# Patient Record
Sex: Female | Born: 1955
Health system: Southern US, Community
[De-identification: ages and names within clinical notes are randomized; demographics above are authoritative.]

## PROBLEM LIST (undated history)

## (undated) DIAGNOSIS — Z7902 Long term (current) use of antithrombotics/antiplatelets: Secondary | ICD-10-CM

## (undated) DIAGNOSIS — I200 Unstable angina: Secondary | ICD-10-CM

## (undated) DIAGNOSIS — T4145XA Adverse effect of unspecified anesthetic, initial encounter: Secondary | ICD-10-CM

## (undated) DIAGNOSIS — I209 Angina pectoris, unspecified: Secondary | ICD-10-CM

## (undated) DIAGNOSIS — R062 Wheezing: Secondary | ICD-10-CM

## (undated) DIAGNOSIS — M1712 Unilateral primary osteoarthritis, left knee: Secondary | ICD-10-CM

## (undated) DIAGNOSIS — M1711 Unilateral primary osteoarthritis, right knee: Secondary | ICD-10-CM

## (undated) DIAGNOSIS — R002 Palpitations: Secondary | ICD-10-CM

## (undated) DIAGNOSIS — I10 Essential (primary) hypertension: Secondary | ICD-10-CM

## (undated) DIAGNOSIS — I8393 Asymptomatic varicose veins of bilateral lower extremities: Secondary | ICD-10-CM

## (undated) DIAGNOSIS — F32A Depression, unspecified: Secondary | ICD-10-CM

## (undated) DIAGNOSIS — R251 Tremor, unspecified: Secondary | ICD-10-CM

## (undated) DIAGNOSIS — E785 Hyperlipidemia, unspecified: Secondary | ICD-10-CM

## (undated) DIAGNOSIS — R053 Chronic cough: Secondary | ICD-10-CM

## (undated) DIAGNOSIS — J40 Bronchitis, not specified as acute or chronic: Secondary | ICD-10-CM

## (undated) DIAGNOSIS — R05 Cough: Secondary | ICD-10-CM

## (undated) DIAGNOSIS — I2510 Atherosclerotic heart disease of native coronary artery without angina pectoris: Secondary | ICD-10-CM

## (undated) DIAGNOSIS — R600 Localized edema: Secondary | ICD-10-CM

## (undated) DIAGNOSIS — S83231A Complex tear of medial meniscus, current injury, right knee, initial encounter: Secondary | ICD-10-CM

## (undated) DIAGNOSIS — J449 Chronic obstructive pulmonary disease, unspecified: Secondary | ICD-10-CM

## (undated) DIAGNOSIS — J189 Pneumonia, unspecified organism: Secondary | ICD-10-CM

## (undated) DIAGNOSIS — Z972 Presence of dental prosthetic device (complete) (partial): Secondary | ICD-10-CM

## (undated) DIAGNOSIS — E119 Type 2 diabetes mellitus without complications: Secondary | ICD-10-CM

## (undated) DIAGNOSIS — M23301 Other meniscus derangements, unspecified lateral meniscus, left knee: Secondary | ICD-10-CM

## (undated) DIAGNOSIS — N809 Endometriosis, unspecified: Secondary | ICD-10-CM

## (undated) DIAGNOSIS — R0609 Other forms of dyspnea: Secondary | ICD-10-CM

## (undated) DIAGNOSIS — F329 Major depressive disorder, single episode, unspecified: Secondary | ICD-10-CM

## (undated) DIAGNOSIS — S83232A Complex tear of medial meniscus, current injury, left knee, initial encounter: Secondary | ICD-10-CM

## (undated) DIAGNOSIS — M7122 Synovial cyst of popliteal space [Baker], left knee: Secondary | ICD-10-CM

## (undated) DIAGNOSIS — I499 Cardiac arrhythmia, unspecified: Secondary | ICD-10-CM

## (undated) DIAGNOSIS — Z789 Other specified health status: Secondary | ICD-10-CM

## (undated) DIAGNOSIS — F419 Anxiety disorder, unspecified: Secondary | ICD-10-CM

## (undated) DIAGNOSIS — Z9889 Other specified postprocedural states: Secondary | ICD-10-CM

## (undated) DIAGNOSIS — J3089 Other allergic rhinitis: Secondary | ICD-10-CM

## (undated) DIAGNOSIS — M7050 Other bursitis of knee, unspecified knee: Secondary | ICD-10-CM

## (undated) DIAGNOSIS — K5790 Diverticulosis of intestine, part unspecified, without perforation or abscess without bleeding: Secondary | ICD-10-CM

## (undated) DIAGNOSIS — J45909 Unspecified asthma, uncomplicated: Secondary | ICD-10-CM

## (undated) DIAGNOSIS — M23306 Other meniscus derangements, unspecified meniscus, right knee: Secondary | ICD-10-CM

## (undated) DIAGNOSIS — G2581 Restless legs syndrome: Secondary | ICD-10-CM

## (undated) DIAGNOSIS — G43909 Migraine, unspecified, not intractable, without status migrainosus: Secondary | ICD-10-CM

## (undated) DIAGNOSIS — R0600 Dyspnea, unspecified: Secondary | ICD-10-CM

## (undated) DIAGNOSIS — T8859XA Other complications of anesthesia, initial encounter: Secondary | ICD-10-CM

## (undated) DIAGNOSIS — M170 Bilateral primary osteoarthritis of knee: Secondary | ICD-10-CM

## (undated) DIAGNOSIS — Z7982 Long term (current) use of aspirin: Secondary | ICD-10-CM

## (undated) HISTORY — PX: ABDOMINAL HYSTERECTOMY: SHX81

## (undated) HISTORY — PX: BARIATRIC SURGERY: SHX1103

## (undated) HISTORY — DX: Angina pectoris, unspecified: I20.9

## (undated) HISTORY — PX: COLONOSCOPY: SHX174

## (undated) HISTORY — PX: EYE SURGERY: SHX253

## (undated) HISTORY — PX: CARDIAC CATHETERIZATION: SHX172

## (undated) HISTORY — PX: ECTOPIC PREGNANCY SURGERY: SHX613

## (undated) HISTORY — PX: COLON SURGERY: SHX602

## (undated) HISTORY — PX: CHOLECYSTECTOMY: SHX55

## (undated) HISTORY — PX: OOPHORECTOMY: SHX86

---

## 2004-01-08 ENCOUNTER — Other Ambulatory Visit: Payer: Self-pay

## 2004-01-08 ENCOUNTER — Emergency Department: Payer: Self-pay | Admitting: Emergency Medicine

## 2004-01-12 ENCOUNTER — Inpatient Hospital Stay: Payer: Self-pay | Admitting: Internal Medicine

## 2004-01-12 ENCOUNTER — Other Ambulatory Visit: Payer: Self-pay

## 2004-01-13 ENCOUNTER — Other Ambulatory Visit: Payer: Self-pay

## 2004-01-13 HISTORY — PX: CORONARY ANGIOPLASTY WITH STENT PLACEMENT: SHX49

## 2004-01-16 DIAGNOSIS — I2510 Atherosclerotic heart disease of native coronary artery without angina pectoris: Secondary | ICD-10-CM

## 2004-01-16 HISTORY — DX: Atherosclerotic heart disease of native coronary artery without angina pectoris: I25.10

## 2004-02-01 ENCOUNTER — Encounter: Payer: Self-pay | Admitting: Cardiology

## 2004-02-11 ENCOUNTER — Encounter: Payer: Self-pay | Admitting: Cardiology

## 2004-03-24 ENCOUNTER — Inpatient Hospital Stay: Payer: Self-pay | Admitting: Cardiology

## 2004-03-24 HISTORY — PX: CORONARY ANGIOPLASTY WITH STENT PLACEMENT: SHX49

## 2005-05-31 ENCOUNTER — Emergency Department: Payer: Self-pay | Admitting: Emergency Medicine

## 2005-05-31 ENCOUNTER — Other Ambulatory Visit: Payer: Self-pay

## 2005-07-26 ENCOUNTER — Ambulatory Visit: Payer: Self-pay | Admitting: Family Medicine

## 2005-08-09 ENCOUNTER — Other Ambulatory Visit: Payer: Self-pay

## 2005-08-09 ENCOUNTER — Emergency Department: Payer: Self-pay | Admitting: Emergency Medicine

## 2005-12-19 ENCOUNTER — Ambulatory Visit: Payer: Self-pay

## 2007-09-26 ENCOUNTER — Ambulatory Visit: Payer: Self-pay | Admitting: Family Medicine

## 2007-09-27 ENCOUNTER — Other Ambulatory Visit: Payer: Self-pay

## 2007-09-27 ENCOUNTER — Emergency Department: Payer: Self-pay | Admitting: Emergency Medicine

## 2008-05-11 DIAGNOSIS — E119 Type 2 diabetes mellitus without complications: Secondary | ICD-10-CM

## 2008-05-11 HISTORY — DX: Type 2 diabetes mellitus without complications: E11.9

## 2008-05-24 ENCOUNTER — Inpatient Hospital Stay: Payer: Self-pay | Admitting: Internal Medicine

## 2008-05-26 HISTORY — PX: LEFT HEART CATH AND CORONARY ANGIOGRAPHY: CATH118249

## 2008-05-29 ENCOUNTER — Inpatient Hospital Stay: Payer: Self-pay | Admitting: Specialist

## 2008-06-16 ENCOUNTER — Ambulatory Visit: Payer: Self-pay | Admitting: Specialist

## 2008-08-07 ENCOUNTER — Emergency Department: Payer: Self-pay | Admitting: Unknown Physician Specialty

## 2009-01-15 ENCOUNTER — Ambulatory Visit: Payer: Self-pay | Admitting: Gastroenterology

## 2009-03-02 ENCOUNTER — Ambulatory Visit: Payer: Self-pay | Admitting: Gastroenterology

## 2009-03-24 ENCOUNTER — Ambulatory Visit: Payer: Self-pay | Admitting: Gastroenterology

## 2010-02-21 ENCOUNTER — Inpatient Hospital Stay: Payer: Self-pay | Admitting: Internal Medicine

## 2010-06-10 ENCOUNTER — Emergency Department: Payer: Self-pay | Admitting: Emergency Medicine

## 2010-08-29 ENCOUNTER — Inpatient Hospital Stay: Payer: Self-pay | Admitting: Internal Medicine

## 2011-01-18 ENCOUNTER — Ambulatory Visit: Payer: Self-pay | Admitting: Cardiology

## 2011-01-18 HISTORY — PX: LEFT HEART CATH AND CORONARY ANGIOGRAPHY: CATH118249

## 2012-03-13 DIAGNOSIS — Z9884 Bariatric surgery status: Secondary | ICD-10-CM

## 2012-03-13 HISTORY — DX: Bariatric surgery status: Z98.84

## 2013-02-12 ENCOUNTER — Emergency Department: Payer: Self-pay | Admitting: Emergency Medicine

## 2014-01-23 ENCOUNTER — Emergency Department: Payer: Self-pay | Admitting: Emergency Medicine

## 2014-02-21 ENCOUNTER — Emergency Department: Payer: Self-pay | Admitting: Emergency Medicine

## 2014-02-21 LAB — BASIC METABOLIC PANEL
Anion Gap: 5 — ABNORMAL LOW (ref 7–16)
BUN: 15 mg/dL (ref 7–18)
CO2: 29 mmol/L (ref 21–32)
CREATININE: 0.7 mg/dL (ref 0.60–1.30)
Calcium, Total: 8.5 mg/dL (ref 8.5–10.1)
Chloride: 108 mmol/L — ABNORMAL HIGH (ref 98–107)
EGFR (African American): >60
EGFR (Non-African Amer.): >60
Glucose: 110 mg/dL — ABNORMAL HIGH (ref 65–99)
Osmolality: 285 (ref 275–301)
Potassium: 4.4 mmol/L (ref 3.5–5.1)
SODIUM: 142 mmol/L (ref 136–145)

## 2014-02-21 LAB — CBC
HCT: 42.8 % (ref 35.0–47.0)
HGB: 14.3 g/dL (ref 12.0–16.0)
MCH: 30.9 pg (ref 26.0–34.0)
MCHC: 33.5 g/dL (ref 32.0–36.0)
MCV: 92 fL (ref 80–100)
PLATELETS: 236 10*3/uL (ref 150–440)
RBC: 4.65 10*6/uL (ref 3.80–5.20)
RDW: 12.5 % (ref 11.5–14.5)
WBC: 7.7 10*3/uL (ref 3.6–11.0)

## 2014-02-21 LAB — TROPONIN I: Troponin-I: <0.02 ng/mL

## 2016-08-31 ENCOUNTER — Emergency Department: Payer: Self-pay

## 2016-08-31 ENCOUNTER — Observation Stay
Admission: EM | Admit: 2016-08-31 | Discharge: 2016-09-01 | Disposition: A | Discharge status: Home / Self Care | Payer: Self-pay | Attending: Internal Medicine | Admitting: Internal Medicine

## 2016-08-31 ENCOUNTER — Encounter: Payer: Self-pay | Admitting: Emergency Medicine

## 2016-08-31 DIAGNOSIS — Z955 Presence of coronary angioplasty implant and graft: Secondary | ICD-10-CM | POA: Insufficient documentation

## 2016-08-31 DIAGNOSIS — J449 Chronic obstructive pulmonary disease, unspecified: Secondary | ICD-10-CM | POA: Insufficient documentation

## 2016-08-31 DIAGNOSIS — Z79899 Other long term (current) drug therapy: Secondary | ICD-10-CM | POA: Insufficient documentation

## 2016-08-31 DIAGNOSIS — F419 Anxiety disorder, unspecified: Secondary | ICD-10-CM | POA: Insufficient documentation

## 2016-08-31 DIAGNOSIS — I2510 Atherosclerotic heart disease of native coronary artery without angina pectoris: Secondary | ICD-10-CM | POA: Insufficient documentation

## 2016-08-31 DIAGNOSIS — Z7982 Long term (current) use of aspirin: Secondary | ICD-10-CM | POA: Insufficient documentation

## 2016-08-31 DIAGNOSIS — R079 Chest pain, unspecified: Principal | ICD-10-CM | POA: Insufficient documentation

## 2016-08-31 DIAGNOSIS — E1140 Type 2 diabetes mellitus with diabetic neuropathy, unspecified: Secondary | ICD-10-CM | POA: Insufficient documentation

## 2016-08-31 DIAGNOSIS — R001 Bradycardia, unspecified: Secondary | ICD-10-CM | POA: Insufficient documentation

## 2016-08-31 DIAGNOSIS — I10 Essential (primary) hypertension: Secondary | ICD-10-CM | POA: Insufficient documentation

## 2016-08-31 HISTORY — DX: Atherosclerotic heart disease of native coronary artery without angina pectoris: I25.10

## 2016-08-31 HISTORY — DX: Essential (primary) hypertension: I10

## 2016-08-31 HISTORY — DX: Chronic obstructive pulmonary disease, unspecified: J44.9

## 2016-08-31 HISTORY — DX: Type 2 diabetes mellitus without complications: E11.9

## 2016-08-31 LAB — CBC
HEMATOCRIT: 39.1 % (ref 35.0–47.0)
Hemoglobin: 13.4 g/dL (ref 12.0–16.0)
MCH: 30.7 pg (ref 26.0–34.0)
MCHC: 34.4 g/dL (ref 32.0–36.0)
MCV: 89.4 fL (ref 80.0–100.0)
PLATELETS: 244 10*3/uL (ref 150–440)
RBC: 4.37 MIL/uL (ref 3.80–5.20)
RDW: 13.1 % (ref 11.5–14.5)
WBC: 5.5 10*3/uL (ref 3.6–11.0)

## 2016-08-31 LAB — BASIC METABOLIC PANEL
Anion gap: 3 — ABNORMAL LOW (ref 5–15)
BUN: 16 mg/dL (ref 6–20)
CALCIUM: 8.8 mg/dL — AB (ref 8.9–10.3)
CO2: 26 mmol/L (ref 22–32)
CREATININE: 0.73 mg/dL (ref 0.44–1.00)
Chloride: 107 mmol/L (ref 101–111)
GFR calc Af Amer: >60 mL/min (ref >60)
GLUCOSE: 121 mg/dL — AB (ref 65–99)
POTASSIUM: 4.2 mmol/L (ref 3.5–5.1)
Sodium: 136 mmol/L (ref 135–145)

## 2016-08-31 LAB — TROPONIN I
Troponin I: <0.03 ng/mL (ref <0.03)
Troponin I: <0.03 ng/mL (ref <0.03)

## 2016-08-31 MED ORDER — ACETAMINOPHEN 650 MG RE SUPP: 650.0000 mg | Freq: Four times a day (QID) | RECTAL | Status: DC | PRN

## 2016-08-31 MED ORDER — ONDANSETRON HCL 4 MG/2ML IJ SOLN: 4.0000 mg | Freq: Four times a day (QID) | INTRAMUSCULAR | Status: DC | PRN

## 2016-08-31 MED ORDER — METOPROLOL SUCCINATE ER 25 MG PO TB24
25.0000 mg | ORAL_TABLET | Freq: Every day | ORAL | Status: DC
  Administered 2016-09-01: 25 mg via ORAL
  Filled 2016-08-31: qty 1

## 2016-08-31 MED ORDER — ALPRAZOLAM 0.5 MG PO TABS
0.5000 mg | ORAL_TABLET | Freq: Two times a day (BID) | ORAL | Status: DC | PRN
  Administered 2016-08-31 – 2016-09-01 (×2): 0.5 mg via ORAL
  Filled 2016-08-31 (×2): qty 1

## 2016-08-31 MED ORDER — NITROGLYCERIN 0.4 MG SL SUBL: 0.4000 mg | SUBLINGUAL_TABLET | SUBLINGUAL | Status: DC | PRN

## 2016-08-31 MED ORDER — ENOXAPARIN SODIUM 40 MG/0.4ML ~~LOC~~ SOLN
40.0000 mg | SUBCUTANEOUS | Status: DC
  Administered 2016-08-31: 40 mg via SUBCUTANEOUS
  Filled 2016-08-31: qty 0.4

## 2016-08-31 MED ORDER — ASPIRIN 81 MG PO CHEW
324.0000 mg | CHEWABLE_TABLET | Freq: Once | ORAL | Status: AC
  Administered 2016-08-31: 324 mg via ORAL
  Filled 2016-08-31: qty 4

## 2016-08-31 MED ORDER — PAROXETINE HCL 20 MG PO TABS
20.0000 mg | ORAL_TABLET | Freq: Every day | ORAL | Status: DC
  Administered 2016-09-01: 20 mg via ORAL
  Filled 2016-08-31: qty 1

## 2016-08-31 MED ORDER — GABAPENTIN 300 MG PO CAPS
300.0000 mg | ORAL_CAPSULE | Freq: Two times a day (BID) | ORAL | Status: DC
  Administered 2016-08-31 – 2016-09-01 (×2): 300 mg via ORAL
  Filled 2016-08-31 (×2): qty 1

## 2016-08-31 MED ORDER — ONDANSETRON HCL 4 MG PO TABS: 4.0000 mg | ORAL_TABLET | Freq: Four times a day (QID) | ORAL | Status: DC | PRN

## 2016-08-31 MED ORDER — ASPIRIN EC 81 MG PO TBEC
81.0000 mg | DELAYED_RELEASE_TABLET | Freq: Every day | ORAL | Status: DC
  Administered 2016-09-01: 81 mg via ORAL
  Filled 2016-08-31: qty 1

## 2016-08-31 MED ORDER — ACETAMINOPHEN 325 MG PO TABS: 650.0000 mg | ORAL_TABLET | Freq: Four times a day (QID) | ORAL | Status: DC | PRN

## 2016-08-31 NOTE — ED Triage Notes (Signed)
Pt in via POV with complaints of centralized chest pressure x 2 weeks w/ associated shortness of breaht, weakness, N/V.  Pt advised per PCP to be seen in ER.  Pt see Dr. Cassie FreerParachos but has been unable to follow up with him regularly due to lack of insurance.  Pt with hx of cardiac cath w/ multiple stents placed in 2004 and 2005.  Vitals WDL, NAD noted at this time.

## 2016-08-31 NOTE — H&P (Signed)
[facility] [place] [facility]   PATIENT NAME: Courtney Blanchard    MR#:  [id number]  DATE OF BIRTH:  1956-03-04  DATE OF ADMISSION:  08/31/2016  PRIMARY CARE PHYSICIAN: Jerl Mina, MD   REQUESTING/REFERRING PHYSICIAN: Dr. Gladstone Pih  CHIEF COMPLAINT:   Chief Complaint  Patient presents with  . Chest Pain    HISTORY OF PRESENT ILLNESS:  Courtney Blanchard  is a 61 y.o. female with a known history of COPD, history of coronary disease status post stents, diabetes, hypertension who presents to the hospital due to chest pain/pressure. Patient says she developed chest pain/pressure over the past week although over the past 2 days it has been persistent. She has associated nausea and shortness of breath with it. Patient says that she has not had these symptoms before. She does have a previous history of coronary artery disease and stent placements but has not seen a cardiologist in many years due to insurance reasons. She now presents to the hospital as her symptoms are not improving and hospitalist services were contacted further treatment and evaluation. Patient's EKG shows sinus bradycardia with LVH but no evidence of acute ST-T wave changes. Her cardiac markers also negative. Given her family history and her risk factors hospitalist services were contacted further treatment and evaluation.  PAST MEDICAL HISTORY:   Past Medical History:  Diagnosis Date  . COPD (chronic obstructive pulmonary disease) (HCC)   . Coronary artery disease   . Diabetes mellitus without complication (HCC)   . Hypertension     PAST SURGICAL HISTORY:  History reviewed. No pertinent surgical history.  SOCIAL HISTORY:   Social History  Substance Use Topics  . Smoking status: Never Smoker  . Smokeless tobacco: Never Used  . Alcohol use No    FAMILY HISTORY:   Family History  Problem Relation Age of Onset  . Hypertension Mother   . Heart disease Father   . CAD Father   .  Heart disease Sister     DRUG ALLERGIES:   Allergies  Allergen Reactions  . Amoxicillin     Has patient had a PCN reaction causing immediate rash, facial/tongue/throat swelling, SOB or lightheadedness with hypotension: Yes Has patient had a PCN reaction causing severe rash involving mucus membranes or skin necrosis: No Has patient had a PCN reaction that required hospitalization: No Has patient had a PCN reaction occurring within the last 10 years: No If all of the above answers are "NO", then may proceed with Cephalosporin use.  Sore throat     REVIEW OF SYSTEMS:   Review of Systems  Constitutional: Negative for chills, fever and weight loss.  HENT: Negative for congestion, nosebleeds and tinnitus.   Eyes: Negative for blurred vision, double vision and redness.  Respiratory: Positive for shortness of breath. Negative for cough, hemoptysis and wheezing.   Cardiovascular: Positive for chest pain. Negative for orthopnea, leg swelling and PND.  Gastrointestinal: Negative for abdominal pain, diarrhea, melena, nausea and vomiting.  Genitourinary: Negative for dysuria, hematuria and urgency.  Musculoskeletal: Negative for falls and joint pain.  Neurological: Negative for dizziness, tingling, sensory change, focal weakness, seizures, weakness and headaches.  Endo/Heme/Allergies: Negative for polydipsia. Does not bruise/bleed easily.  Psychiatric/Behavioral: Negative for depression and memory loss. The patient is not nervous/anxious.   All other systems reviewed and are negative.   MEDICATIONS AT HOME:   Prior to Admission medications   Medication Sig Start Date End Date Taking? Authorizing Provider  acetaminophen (TYLENOL) 500 MG tablet  Take 500 mg by mouth every 6 (six) hours as needed.   Yes [provider]  ALPRAZolam Prudy Feeler) 0.5 MG tablet Take 0.5 mg by mouth 2 (two) times daily as needed. 07/04/16  Yes [provider]  aspirin 81 MG tablet Take 81 mg by mouth  daily.    Yes [provider]  gabapentin (NEURONTIN) 300 MG capsule Take 300 mg by mouth 2 (two) times daily.   Yes [provider]  metoprolol succinate (TOPROL-XL) 25 MG 24 hr tablet Take 25 mg by mouth daily. 06/26/16  Yes [provider]  PARoxetine (PAXIL) 20 MG tablet Take 20 mg by mouth daily. 02/28/16  Yes [provider]      VITAL SIGNS:  Blood pressure 121/83, pulse 69, temperature 98.2 F (36.8 C), temperature source Oral, resp. rate 12, height 5\' 2"  (1.575 m), weight 93 kg (205 lb), SpO2 92 %.  PHYSICAL EXAMINATION:  Physical Exam  GENERAL:  61 y.o.-year-old patient lying in the bed in no acute distress.  EYES: Pupils equal, round, reactive to light and accommodation. No scleral icterus. Extraocular muscles intact.  HEENT: Head atraumatic, normocephalic. Oropharynx and nasopharynx clear. No oropharyngeal erythema, moist oral mucosa  NECK:  Supple, no jugular venous distention. No thyroid enlargement, no tenderness.  LUNGS: Normal breath sounds bilaterally, no wheezing, rales, rhonchi. No use of accessory muscles of respiration.  CARDIOVASCULAR: S1, S2 RRR. No murmurs, rubs, gallops, clicks.  ABDOMEN: Soft, nontender, nondistended. Bowel sounds present. No organomegaly or mass.  EXTREMITIES: No pedal edema, cyanosis, or clubbing. + 2 pedal & radial pulses b/l.   NEUROLOGIC: Cranial nerves II through XII are intact. No focal Motor or sensory deficits appreciated b/l. PSYCHIATRIC: The patient is alert and oriented x 3. Good affect.  SKIN: No obvious rash, lesion, or ulcer.   LABORATORY PANEL:   CBC  Recent Labs Lab 08/31/16 1439  WBC 5.5  HGB 13.4  HCT 39.1  PLT 244   ------------------------------------------------------------------------------------------------------------------  Chemistries   Recent Labs Lab 08/31/16 1439  NA 136  K 4.2  CL 107  CO2 26  GLUCOSE 121*  BUN 16  CREATININE 0.73  CALCIUM 8.8*    ------------------------------------------------------------------------------------------------------------------  Cardiac Enzymes  Recent Labs Lab 08/31/16 1439  TROPONINI <0.03   ------------------------------------------------------------------------------------------------------------------  RADIOLOGY:  Dg Chest 2 View  Result Date: 08/31/2016 CLINICAL DATA:  Short of breath for 1 month, worse recently, chest pressure EXAM: CHEST  2 VIEW COMPARISON:  Chest x-ray of 08/29/2010 FINDINGS: No active infiltrate or effusion is seen. Mediastinal and hilar contours are unremarkable. The heart is within normal limits in size. Coronary artery stent is noted anteriorly. No bony abnormality is seen. IMPRESSION: No active cardiopulmonary disease. Electronically Signed   By: Dwyane Dee M.D.   On: 08/31/2016 14:58   US Venous Img Lower Bilateral  Result Date: 08/31/2016 CLINICAL DATA:  BILATERAL lower extremity pain, history of DVT, diabetes mellitus, hypertension, COPD, coronary artery disease EXAM: BILATERAL LOWER EXTREMITY VENOUS DOPPLER ULTRASOUND TECHNIQUE: Gray-scale sonography with graded compression, as well as color Doppler and duplex ultrasound were performed to evaluate the lower extremity deep venous systems from the level of the common femoral vein and including the common femoral, femoral, profunda femoral, popliteal and calf veins including the posterior tibial, peroneal and gastrocnemius veins when visible. The superficial great saphenous vein was also interrogated. Spectral Doppler was utilized to evaluate flow at rest and with distal augmentation maneuvers in the common femoral, femoral and popliteal veins. COMPARISON:  02/25/2010  FINDINGS: RIGHT LOWER EXTREMITY Common Femoral Vein: No evidence of thrombus. Normal compressibility, respiratory phasicity and response to augmentation. Saphenofemoral Junction: No evidence of thrombus. Normal compressibility and flow on color Doppler  imaging. Profunda Femoral Vein: No evidence of thrombus. Normal compressibility and flow on color Doppler imaging. Femoral Vein: No evidence of thrombus. Normal compressibility, respiratory phasicity and response to augmentation. Popliteal Vein: No evidence of thrombus. Normal compressibility, respiratory phasicity and response to augmentation. Calf Veins: No evidence of thrombus. Normal compressibility and flow on color Doppler imaging. Superficial Great Saphenous Vein: No evidence of thrombus. Normal compressibility and flow on color Doppler imaging. Venous Reflux:  None. Other Findings: Mildly complicated Baker cyst RIGHT popliteal fossa 5.1 x 1.2 x 2.1 cm containing scattered internal echoes/debris. LEFT LOWER EXTREMITY Common Femoral Vein: No evidence of thrombus. Normal compressibility, respiratory phasicity and response to augmentation. Saphenofemoral Junction: No evidence of thrombus. Normal compressibility and flow on color Doppler imaging. Profunda Femoral Vein: No evidence of thrombus. Normal compressibility and flow on color Doppler imaging. Femoral Vein: No evidence of thrombus. Normal compressibility, respiratory phasicity and response to augmentation. Popliteal Vein: No evidence of thrombus. Normal compressibility, respiratory phasicity and response to augmentation. Calf Veins: No evidence of thrombus. Normal compressibility and flow on color Doppler imaging. Superficial Great Saphenous Vein: No evidence of thrombus. Normal compressibility and flow on color Doppler imaging. Venous Reflux:  None. Other Findings:  Small LEFT popliteal fossa cyst 12 x 8 x 11 mm. IMPRESSION: No evidence of DVT within either lower extremity. Probable BILATERAL Baker cysts, smaller on LEFT. Electronically Signed   By: Ulyses SouthwardMark  Boles M.D.   On: 08/31/2016 18:53     IMPRESSION AND PLAN:   61 year old female with past medical history of COPD, hypertension, history of coronary artery disease status post stent, diabetes who  presents to the hospital with chest pain.  1. Chest pain-patient's symptoms are very atypical although she has significant risk factors given her diabetes, hypertension and previous history of coronary artery disease. -Cervical overnight on telemetry, cycle her cardiac markers. We'll get a nuclear medicine stress test in the morning.  - will place on aspirin, nitroglycerin. -Suspect this is mostly anxiety mediated.  2. Essential hypertension-continue Toprol.  3. Anxiety-continue Xanax, Paxil.  4. History of neuropathy-continue gabapentin    All the records are reviewed and case discussed with ED provider. Management plans discussed with the patient, family and they are in agreement.  CODE STATUS: full code  TOTAL TIME TAKING CARE OF THIS PATIENT: 40 minutes.    Houston SirenSAINANI,VIVEK J M.D on 08/31/2016 at 6:58 PM  Between 7am to 6pm - Pager - [phone]  After 6pm go to [email or web address] - password [email or web address] [facility]  [facility]  Office  [phone]  CC: Primary care physician; Jerl MinaHedrick, James, MD

## 2016-08-31 NOTE — ED Provider Notes (Signed)
[facility] Emergency Department Provider Note  ____________________________________________   First MD Initiated Contact with Patient 08/31/16 1715     (approximate)  I have reviewed the triage vital signs and the nursing notes.   HISTORY  Chief Complaint Chest Pain   HPI Courtney Blanchard is a 61 y.o. female with a history of coronary disease status post stenting in 2004 2005 was presenting to the emergency department today with on and off chest pain, weakness, nausea vomiting over the past several weeks.  The patient says that the chest pain feels like someone is sitting on her chest and radiates up to her neck, bilaterally. She says that it comes and goes at random and last about a minute at a time. She says that she was having more frequent episodes today and so presented to the emergency department for further evaluation. She says that these symptoms are similar to when she presented greater than 10 years ago and required stenting. She says that she also has bilateral lower extremity aching especially to her posterior left calf and has a history of blood clots. The patient is only now on a baby aspirin per day.    Past Medical History:  Diagnosis Date  . COPD (chronic obstructive pulmonary disease) (HCC)   . Coronary artery disease   . Diabetes mellitus without complication (HCC)   . Hypertension     There are no active problems to display for this patient.   History reviewed. No pertinent surgical history.  Prior to Admission medications   Not on File    Allergies Amoxicillin  No family history on file.  Social History Social History  Substance Use Topics  . Smoking status: Never Smoker  . Smokeless tobacco: Never Used  . Alcohol use No    Review of Systems  Constitutional: No fever/chills Eyes: No visual changes. ENT: No sore throat. Cardiovascular: as above Respiratory: as above Gastrointestinal: No abdominal pain.  No  diarrhea.  No constipation. Genitourinary: Negative for dysuria. Musculoskeletal: Negative for back pain. Skin: Negative for rash. Neurological: Negative for headaches, focal weakness or numbness.   ____________________________________________   PHYSICAL EXAM:  VITAL SIGNS: ED Triage Vitals  Enc Vitals Group     BP 08/31/16 1436 123/64     Pulse Rate 08/31/16 1436 (!) 53     Resp 08/31/16 1436 16     Temp 08/31/16 1436 98.2 F (36.8 C)     Temp Source 08/31/16 1436 Oral     SpO2 08/31/16 1436 97 %     Weight 08/31/16 1437 205 lb (93 kg)     Height 08/31/16 1437 5\' 2"  (1.575 m)     Head Circumference --      Peak Flow --      Pain Score 08/31/16 1436 8     Pain Loc --      Pain Edu? --      Excl. in GC? --     Constitutional: Alert and oriented. Well appearing and in no acute distress. Eyes: Conjunctivae are normal.  Head: Atraumatic. Nose: No congestion/rhinnorhea. Mouth/Throat: Mucous membranes are moist.  Neck: No stridor.   Cardiovascular: Normal rate, regular rhythm. Grossly normal heart sounds.   Respiratory: Normal respiratory effort.  No retractions. Lungs CTAB. Gastrointestinal: Soft and nontender. No distention.  Musculoskeletal: No lower extremity tenderness nor edema.  No joint effusions. Neurologic:  Normal speech and language. No gross focal neurologic deficits are appreciated. Skin:  Skin is warm, dry and  intact. No rash noted. Psychiatric: Mood and affect are normal. Speech and behavior are normal.  ____________________________________________   LABS (all labs ordered are listed, but only abnormal results are displayed)  Labs Reviewed  BASIC METABOLIC PANEL - Abnormal; Notable for the following:       Result Value   Glucose, Bld 121 (*)    Calcium 8.8 (*)    Anion gap 3 (*)    All other components within normal limits  CBC  TROPONIN I   ____________________________________________  EKG  ED ECG REPORT I, Arelia Longest, the  attending physician, personally viewed and interpreted this ECG.   Date: 08/31/2016  EKG Time: 1430  Rate: 52  Rhythm: Sinus bradycardia  Axis: Normal  Intervals:none  ST&T Change: No ST segment elevation or depressions. No abnormal T-wave inversion.  ____________________________________________  RADIOLOGY  No active cardiopulmonary disease ____________________________________________   PROCEDURES  Procedure(s) performed:   Procedures  Critical Care performed:   ____________________________________________   INITIAL IMPRESSION / ASSESSMENT AND PLAN / ED COURSE  Pertinent labs & imaging results that were available during my care of the patient were reviewed by me and considered in my medical decision making (see chart for details).  Patient says that she is very concerned about her symptoms especially because there almost identical to when she required stents in the past. Very reassuring initial workup. However, because of her clinical presentation she'll be admitted to the hospital for further cardiac monitoring and cardiology consult. Signed out to Dr. Cherlynn Kaiser. Patient is understanding of this plan and willing to comply. Symptoms consistent with unstable angina. Patient says that the symptoms occur even at rest.      ____________________________________________   FINAL CLINICAL IMPRESSION(S) / ED DIAGNOSES  Chest pain.    NEW MEDICATIONS STARTED DURING THIS VISIT:  New Prescriptions   No medications on file     Note:  This document was prepared using Dragon voice recognition software and may include unintentional dictation errors.     Myrna Blazer, MD 08/31/16 [phone]

## 2016-08-31 NOTE — ED Notes (Signed)
Patient reports that for the last month, she has had increasing SOB and fatigue with exertion. States she is normally very active but has had more trouble moving around. Patient denies swelling in feet or hands. Reports she has pain in her neck and chest that has been intermittent for 2-3 weeks. Patient reports history of stent placement in 2004.

## 2016-08-31 NOTE — ED Notes (Signed)
Patient transported to X-ray 

## 2016-08-31 NOTE — ED Notes (Signed)
Patient transported to US 

## 2016-09-01 ENCOUNTER — Ambulatory Visit (HOSPITAL_BASED_OUTPATIENT_CLINIC_OR_DEPARTMENT_OTHER): Payer: Self-pay

## 2016-09-01 DIAGNOSIS — R079 Chest pain, unspecified: Secondary | ICD-10-CM

## 2016-09-01 LAB — CBC
HCT: 36.1 % (ref 35.0–47.0)
HEMOGLOBIN: 12.5 g/dL (ref 12.0–16.0)
MCH: 31.2 pg (ref 26.0–34.0)
MCHC: 34.6 g/dL (ref 32.0–36.0)
MCV: 90 fL (ref 80.0–100.0)
PLATELETS: 192 10*3/uL (ref 150–440)
RBC: 4.02 MIL/uL (ref 3.80–5.20)
RDW: 12.8 % (ref 11.5–14.5)
WBC: 5.3 10*3/uL (ref 3.6–11.0)

## 2016-09-01 LAB — BASIC METABOLIC PANEL
ANION GAP: 5 (ref 5–15)
BUN: 14 mg/dL (ref 6–20)
CALCIUM: 8.5 mg/dL — AB (ref 8.9–10.3)
CO2: 26 mmol/L (ref 22–32)
Chloride: 109 mmol/L (ref 101–111)
Creatinine, Ser: 0.52 mg/dL (ref 0.44–1.00)
GFR calc non Af Amer: >60 mL/min (ref >60)
Glucose, Bld: 75 mg/dL (ref 65–99)
Potassium: 3.9 mmol/L (ref 3.5–5.1)
SODIUM: 140 mmol/L (ref 135–145)

## 2016-09-01 LAB — NM MYOCAR MULTI W/SPECT W/WALL MOTION / EF
CHL CUP NUCLEAR SRS: 2
CHL CUP NUCLEAR SSS: 2
CSEPED: 0 min
CSEPEDS: 0 s
CSEPHR: 46 %
CSEPPHR: 75 {beats}/min
Estimated workload: 1 METS
LV sys vol: 27 mL
LVDIAVOL: 80 mL (ref 46–106)
MPHR: 162 {beats}/min
Rest HR: 45 {beats}/min
SDS: 0
TID: 0.9

## 2016-09-01 LAB — TROPONIN I: Troponin I: <0.03 ng/mL (ref <0.03)

## 2016-09-01 MED ORDER — TECHNETIUM TC 99M TETROFOSMIN IV KIT
29.5500 | PACK | Freq: Once | INTRAVENOUS | Status: AC | PRN
  Administered 2016-09-01: 29.55 via INTRAVENOUS

## 2016-09-01 MED ORDER — TECHNETIUM TC 99M TETROFOSMIN IV KIT
13.1700 | PACK | Freq: Once | INTRAVENOUS | Status: AC | PRN
  Administered 2016-09-01: 13.17 via INTRAVENOUS

## 2016-09-01 MED ORDER — REGADENOSON 0.4 MG/5ML IV SOLN
0.4000 mg | Freq: Once | INTRAVENOUS | Status: AC
  Administered 2016-09-01: 0.4 mg via INTRAVENOUS

## 2016-09-01 NOTE — Care Management (Signed)
RNCM received call from Dr. Allena KatzPatel stating that patient is telling her that she will need written prescriptions at discharge. I explained to both Dr. Allena KatzPatel and patient that this is only for new medications however per my conversation with Dr. Allena KatzPatel she does not plan to discharge her on any new medications. Patient advised that when she receives prescriptions for refills from her PCP or [facility] (when established) she should take them to Medication management Monday thru Friday before 430P to see if they can assist her. She is aware that they will not be able to assist her with any psychotropic or narcotic medications. Patient did not have any questions.

## 2016-09-01 NOTE — Care Management (Signed)
No other RNCM needs identified.

## 2016-09-01 NOTE — Progress Notes (Signed)
Admitted from home with chest pain. Denies pain now. A&O. Up with minimal assist. Stress test for today. Consent signed. Slept well through the night.

## 2016-09-01 NOTE — Discharge Summary (Signed)
[facility] - [place] at [facility]   PATIENT NAME: Courtney Blanchard    MR#:  [id number]  DATE OF BIRTH:  06-06-1955  DATE OF ADMISSION:  08/31/2016 ADMITTING PHYSICIAN: Houston Siren, MD  DATE OF DISCHARGE: 09/01/16  PRIMARY CARE PHYSICIAN: Jerl Mina, MD    ADMISSION DIAGNOSIS:  Chest pain, unspecified type [R07.9]  DISCHARGE DIAGNOSIS:  Chest pain ruled out MI Transient bradycardia--now off BB  SECONDARY DIAGNOSIS:   Past Medical History:  Diagnosis Date  . COPD (chronic obstructive pulmonary disease) (HCC)   . Coronary artery disease   . Diabetes mellitus without complication (HCC)   . Hypertension     HOSPITAL COURSE:  61 year old female with past medical history of COPD, hypertension, history of coronary artery disease status post stent, diabetes who presents to the hospital with chest pain.  1. Chest pain-patient's symptoms are very atypical although she has significant risk factors given her diabetes, hypertension and previous history of coronary artery disease. -Myoview stress test per cardiology negative. Mild low EF--final results pending - will place on aspirin, nitroglycerin. -Suspect this is mostly anxiety mediated.  2. Essential hypertension-BP low normal -hold off BB due to HR 44-55. Pt will cont to monitor HR at home  3. Anxiety-continue Xanax, Paxil.  4. History of neuropathy-continue gabapentin  D/c home with out pt f/u with Dr Burnett Sheng  CONSULTS OBTAINED:    DRUG ALLERGIES:   Allergies  Allergen Reactions  . Amoxicillin     Has patient had a PCN reaction causing immediate rash, facial/tongue/throat swelling, SOB or lightheadedness with hypotension: Yes Has patient had a PCN reaction causing severe rash involving mucus membranes or skin necrosis: No Has patient had a PCN reaction that required hospitalization: No Has patient had a PCN reaction occurring within the last 10 years: No If all of the above  answers are "NO", then may proceed with Cephalosporin use.  Sore throat     DISCHARGE MEDICATIONS:   Current Discharge Medication List    CONTINUE these medications which have NOT CHANGED   Details  acetaminophen (TYLENOL) 500 MG tablet Take 500 mg by mouth every 6 (six) hours as needed.    ALPRAZolam (XANAX) 0.5 MG tablet Take 0.5 mg by mouth 2 (two) times daily as needed.    aspirin 81 MG tablet Take 81 mg by mouth daily.     gabapentin (NEURONTIN) 300 MG capsule Take 300 mg by mouth 2 (two) times daily.    PARoxetine (PAXIL) 20 MG tablet Take 20 mg by mouth daily.      STOP taking these medications     metoprolol succinate (TOPROL-XL) 25 MG 24 hr tablet         If you experience worsening of your admission symptoms, develop shortness of breath, life threatening emergency, suicidal or homicidal thoughts you must seek medical attention immediately by calling 911 or calling your MD immediately  if symptoms less severe.  You Must read complete instructions/literature along with all the possible adverse reactions/side effects for all the Medicines you take and that have been prescribed to you. Take any new Medicines after you have completely understood and accept all the possible adverse reactions/side effects.   Please note  You were cared for by a hospitalist during your hospital stay. If you have any questions about your discharge medications or the care you received while you were in the hospital after you are discharged, you can call the unit and asked to speak with the hospitalist on  call if the hospitalist that took care of you is not available. Once you are discharged, your primary care physician will handle any further medical issues. Please note that NO REFILLS for any discharge medications will be authorized once you are discharged, as it is imperative that you return to your primary care physician (or establish a relationship with a primary care physician if you do not  have one) for your aftercare needs so that they can reassess your need for medications and monitor your lab values. Today   SUBJECTIVE   Generalized weakness  VITAL SIGNS:  Blood pressure 106/60, pulse (!) 52, temperature 97.9 F (36.6 C), temperature source Oral, resp. rate 16, height 5\' 2"  (1.575 m), weight 88.3 kg (194 lb 11.2 oz), SpO2 98 %.  I/O:   Intake/Output Summary (Last 24 hours) at 09/01/16 1618 Last data filed at 09/01/16 0858  Gross per 24 hour  Intake                0 ml  Output              550 ml  Net             -550 ml    PHYSICAL EXAMINATION:  GENERAL:  61 y.o.-year-old patient lying in the bed with no acute distress.  EYES: Pupils equal, round, reactive to light and accommodation. No scleral icterus. Extraocular muscles intact.  HEENT: Head atraumatic, normocephalic. Oropharynx and nasopharynx clear.  NECK:  Supple, no jugular venous distention. No thyroid enlargement, no tenderness.  LUNGS: Normal breath sounds bilaterally, no wheezing, rales,rhonchi or crepitation. No use of accessory muscles of respiration.  CARDIOVASCULAR: S1, S2 normal. No murmurs, rubs, or gallops.  ABDOMEN: Soft, non-tender, non-distended. Bowel sounds present. No organomegaly or mass.  EXTREMITIES: No pedal edema, cyanosis, or clubbing.  NEUROLOGIC: Cranial nerves II through XII are intact. Muscle strength 5/5 in all extremities. Sensation intact. Gait not checked.  PSYCHIATRIC: The patient is alert and oriented x 3.  SKIN: No obvious rash, lesion, or ulcer.   DATA REVIEW:   CBC   Recent Labs Lab 09/01/16 0351  WBC 5.3  HGB 12.5  HCT 36.1  PLT 192    Chemistries   Recent Labs Lab 09/01/16 0351  NA 140  K 3.9  CL 109  CO2 26  GLUCOSE 75  BUN 14  CREATININE 0.52  CALCIUM 8.5*    Microbiology Results   No results found for this or any previous visit (from the past 240 hour(s)).  RADIOLOGY:  Dg Chest 2 View  Result Date: 08/31/2016 CLINICAL DATA:  Short of  breath for 1 month, worse recently, chest pressure EXAM: CHEST  2 VIEW COMPARISON:  Chest x-ray of 08/29/2010 FINDINGS: No active infiltrate or effusion is seen. Mediastinal and hilar contours are unremarkable. The heart is within normal limits in size. Coronary artery stent is noted anteriorly. No bony abnormality is seen. IMPRESSION: No active cardiopulmonary disease. Electronically Signed   By: Dwyane DeePaul  Barry M.D.   On: 08/31/2016 14:58   [place] Venous Img Lower Bilateral  Result Date: 08/31/2016 CLINICAL DATA:  BILATERAL lower extremity pain, history of DVT, diabetes mellitus, hypertension, COPD, coronary artery disease EXAM: BILATERAL LOWER EXTREMITY VENOUS DOPPLER ULTRASOUND TECHNIQUE: Gray-scale sonography with graded compression, as well as color Doppler and duplex ultrasound were performed to evaluate the lower extremity deep venous systems from the level of the common femoral vein and including the common femoral, femoral, profunda femoral, popliteal and calf veins including  the posterior tibial, peroneal and gastrocnemius veins when visible. The superficial great saphenous vein was also interrogated. Spectral Doppler was utilized to evaluate flow at rest and with distal augmentation maneuvers in the common femoral, femoral and popliteal veins. COMPARISON:  02/25/2010 FINDINGS: RIGHT LOWER EXTREMITY Common Femoral Vein: No evidence of thrombus. Normal compressibility, respiratory phasicity and response to augmentation. Saphenofemoral Junction: No evidence of thrombus. Normal compressibility and flow on color Doppler imaging. Profunda Femoral Vein: No evidence of thrombus. Normal compressibility and flow on color Doppler imaging. Femoral Vein: No evidence of thrombus. Normal compressibility, respiratory phasicity and response to augmentation. Popliteal Vein: No evidence of thrombus. Normal compressibility, respiratory phasicity and response to augmentation. Calf Veins: No evidence of thrombus. Normal  compressibility and flow on color Doppler imaging. Superficial Great Saphenous Vein: No evidence of thrombus. Normal compressibility and flow on color Doppler imaging. Venous Reflux:  None. Other Findings: Mildly complicated Baker cyst RIGHT popliteal fossa 5.1 x 1.2 x 2.1 cm containing scattered internal echoes/debris. LEFT LOWER EXTREMITY Common Femoral Vein: No evidence of thrombus. Normal compressibility, respiratory phasicity and response to augmentation. Saphenofemoral Junction: No evidence of thrombus. Normal compressibility and flow on color Doppler imaging. Profunda Femoral Vein: No evidence of thrombus. Normal compressibility and flow on color Doppler imaging. Femoral Vein: No evidence of thrombus. Normal compressibility, respiratory phasicity and response to augmentation. Popliteal Vein: No evidence of thrombus. Normal compressibility, respiratory phasicity and response to augmentation. Calf Veins: No evidence of thrombus. Normal compressibility and flow on color Doppler imaging. Superficial Great Saphenous Vein: No evidence of thrombus. Normal compressibility and flow on color Doppler imaging. Venous Reflux:  None. Other Findings:  Small LEFT popliteal fossa cyst 12 x 8 x 11 mm. IMPRESSION: No evidence of DVT within either lower extremity. Probable BILATERAL Baker cysts, smaller on LEFT. Electronically Signed   By: Ulyses Southward M.D.   On: 08/31/2016 18:53     Management plans discussed with the patient, family and they are in agreement.  CODE STATUS:     Code Status Orders        Start     Ordered   08/31/16 2003  Full code  Continuous     08/31/16 2002    Code Status History    Date Active Date Inactive Code Status Order ID Comments User Context   This patient has a current code status but no historical code status.      TOTAL TIME TAKING CARE OF THIS PATIENT: *40* minutes.    Linkyn Gobin M.D on 09/01/2016 at 4:18 PM  Between 7am to 6pm - Pager - [phone] After 6pm go to  [email or web address] - Social research officer, government  [facility] [place] [facility]  Office  [phone]  CC: Primary care physician; Jerl Mina, MD

## 2016-09-01 NOTE — Progress Notes (Signed)
Patient discharged home with as ordered,instructions explained and well understood,escorted by daughter via wheel chair

## 2016-09-01 NOTE — Care Management Note (Signed)
Case Management Note  Patient Details  Name: Courtney Blanchard MRN: [id number] Date of Birth: 1955/09/22  Subjective/Objective:                  Met with patient to discuss discharge planning with her permission. She does not have health insurance. She is from home with her husband. She has filed for disability. Husband receives Medicaid. Monthly income [phone].Telephone [phone]. She drives. She requests assistance with medication and PCP.   Action/Plan:  [facility] and Medication Management application explained and left with patient. Referral to both by resources.   Expected Discharge Date:                  Expected Discharge Plan:     In-House Referral:     Discharge planning Services  CM Consult, [facility], Medication Assistance  Post Acute Care Choice:    Choice offered to:  Patient  DME Arranged:    DME Agency:     HH Arranged:    [place] Agency:     Status of Service:  In process, will continue to follow  If discussed at Long Length of Stay Meetings, dates discussed:    Additional Comments:  Marshell Garfinkel, RN 09/01/2016, 9:39 AM

## 2016-09-02 LAB — HIV ANTIBODY (ROUTINE TESTING W REFLEX): HIV SCREEN 4TH GENERATION: NONREACTIVE

## 2017-06-28 ENCOUNTER — Encounter: Payer: Self-pay | Admitting: Emergency Medicine

## 2017-06-28 ENCOUNTER — Emergency Department: Payer: Self-pay

## 2017-06-28 ENCOUNTER — Emergency Department
Admission: EM | Admit: 2017-06-28 | Discharge: 2017-06-28 | Disposition: A | Discharge status: Home / Self Care | Payer: Self-pay | Attending: Emergency Medicine | Admitting: Emergency Medicine

## 2017-06-28 DIAGNOSIS — I2510 Atherosclerotic heart disease of native coronary artery without angina pectoris: Secondary | ICD-10-CM | POA: Insufficient documentation

## 2017-06-28 DIAGNOSIS — T7840XA Allergy, unspecified, initial encounter: Secondary | ICD-10-CM | POA: Insufficient documentation

## 2017-06-28 DIAGNOSIS — Z79899 Other long term (current) drug therapy: Secondary | ICD-10-CM | POA: Insufficient documentation

## 2017-06-28 DIAGNOSIS — I10 Essential (primary) hypertension: Secondary | ICD-10-CM | POA: Insufficient documentation

## 2017-06-28 DIAGNOSIS — J449 Chronic obstructive pulmonary disease, unspecified: Secondary | ICD-10-CM | POA: Insufficient documentation

## 2017-06-28 DIAGNOSIS — E119 Type 2 diabetes mellitus without complications: Secondary | ICD-10-CM | POA: Insufficient documentation

## 2017-06-28 DIAGNOSIS — Z7982 Long term (current) use of aspirin: Secondary | ICD-10-CM | POA: Insufficient documentation

## 2017-06-28 LAB — COMPREHENSIVE METABOLIC PANEL
ALBUMIN: 4.1 g/dL (ref 3.5–5.0)
ALT: 19 U/L (ref 14–54)
AST: 29 U/L (ref 15–41)
Alkaline Phosphatase: 112 U/L (ref 38–126)
Anion gap: 9 (ref 5–15)
BUN: 15 mg/dL (ref 6–20)
CO2: 24 mmol/L (ref 22–32)
Calcium: 9.3 mg/dL (ref 8.9–10.3)
Chloride: 105 mmol/L (ref 101–111)
Creatinine, Ser: 0.61 mg/dL (ref 0.44–1.00)
GFR calc Af Amer: >60 mL/min (ref >60)
Glucose, Bld: 135 mg/dL — ABNORMAL HIGH (ref 65–99)
POTASSIUM: 3.7 mmol/L (ref 3.5–5.1)
SODIUM: 138 mmol/L (ref 135–145)
Total Bilirubin: 0.8 mg/dL (ref 0.3–1.2)
Total Protein: 7.3 g/dL (ref 6.5–8.1)

## 2017-06-28 LAB — CBC WITH DIFFERENTIAL/PLATELET
Basophils Absolute: 0 10*3/uL (ref 0–0.1)
Basophils Relative: 0 %
EOS PCT: 1 %
Eosinophils Absolute: 0.1 10*3/uL (ref 0–0.7)
HCT: 37.2 % (ref 35.0–47.0)
HEMOGLOBIN: 12.9 g/dL (ref 12.0–16.0)
LYMPHS ABS: 3.6 10*3/uL (ref 1.0–3.6)
LYMPHS PCT: 44 %
MCH: 31.4 pg (ref 26.0–34.0)
MCHC: 34.7 g/dL (ref 32.0–36.0)
MCV: 90.3 fL (ref 80.0–100.0)
Monocytes Absolute: 0.6 10*3/uL (ref 0.2–0.9)
Monocytes Relative: 8 %
Neutro Abs: 3.8 10*3/uL (ref 1.4–6.5)
Neutrophils Relative %: 47 %
PLATELETS: 232 10*3/uL (ref 150–440)
RBC: 4.12 MIL/uL (ref 3.80–5.20)
RDW: 12.3 % (ref 11.5–14.5)
WBC: 8.2 10*3/uL (ref 3.6–11.0)

## 2017-06-28 LAB — MAGNESIUM: Magnesium: 2 mg/dL (ref 1.7–2.4)

## 2017-06-28 LAB — LIPASE, BLOOD: Lipase: 27 U/L (ref 11–51)

## 2017-06-28 LAB — TROPONIN I
Troponin I: <0.03 ng/mL (ref <0.03)
Troponin I: <0.03 ng/mL (ref <0.03)

## 2017-06-28 MED ORDER — METHYLPREDNISOLONE SODIUM SUCC 125 MG IJ SOLR
60.0000 mg | Freq: Once | INTRAMUSCULAR | Status: AC
  Administered 2017-06-28: 60 mg via INTRAVENOUS
  Filled 2017-06-28: qty 2

## 2017-06-28 MED ORDER — LORAZEPAM 2 MG/ML IJ SOLN
0.5000 mg | Freq: Once | INTRAMUSCULAR | Status: AC
  Administered 2017-06-28: 0.5 mg via INTRAVENOUS
  Filled 2017-06-28: qty 1

## 2017-06-28 MED ORDER — DIPHENHYDRAMINE HCL 50 MG/ML IJ SOLN
50.0000 mg | Freq: Once | INTRAMUSCULAR | Status: AC
  Administered 2017-06-28: 50 mg via INTRAVENOUS

## 2017-06-28 MED ORDER — FAMOTIDINE IN NACL 20-0.9 MG/50ML-% IV SOLN
INTRAVENOUS | Status: AC
  Filled 2017-06-28: qty 50

## 2017-06-28 MED ORDER — DIPHENHYDRAMINE HCL 50 MG/ML IJ SOLN
INTRAMUSCULAR | Status: AC
  Filled 2017-06-28: qty 1

## 2017-06-28 MED ORDER — FAMOTIDINE IN NACL 20-0.9 MG/50ML-% IV SOLN
20.0000 mg | Freq: Once | INTRAVENOUS | Status: AC
Start: 2017-06-28 — End: 2017-06-28
  Administered 2017-06-28: 20 mg via INTRAVENOUS

## 2017-06-28 NOTE — ED Provider Notes (Signed)
-----------------------------------------   10:41 PM on 06/28/2017 -----------------------------------------  Patient's labs are resulted largely within normal limits including repeat troponin.  Patient appears very well at this time, suspect likely allergic reaction.  We will discharge with Benadryl to be used as needed.  Overall patient appears well, reassuring vitals.  Patient agreeable to this plan of care.   Minna AntisPaduchowski, Leland Raver, MD 06/28/17 2242

## 2017-06-28 NOTE — ED Notes (Signed)
Pt arms appear to be much better than the previous assessment. Redness has drastically decreased, pt states burning sensation has also decreased

## 2017-06-28 NOTE — Discharge Instructions (Signed)
As we discussed please use Benadryl 50 mg every 6 hours as needed for rash/discomfort.  Return to the emergency department for any chest pain, shortness of breath, or any other symptom personally concerning to yourself.  Please call your primary care doctor tomorrow to arrange a follow-up appointment for recheck/reevaluation.

## 2017-06-28 NOTE — ED Notes (Signed)
Pt states she is having a cramping sensation on the right side of her neck that is a 10/10 pain. Pt complains of burning sensation on the upper body including stomach, chest and both arms. Pt arms and upper chest appear red. Pt seems very anxious and states her heart is pounding and will occasionally start kicking her right leg against the bed.

## 2017-06-28 NOTE — ED Triage Notes (Signed)
Pt comes into the ED via POV c/o chest pain and possible allergic reaction.  Patient states the chest pain started today.  Patient states her heart was racing and she felt short of breath and nauseas.  Patient then noticed that her arms were red, part of her abdomen and chest and her ears.  States she put a new medication on her father today and she has never used that product before in the past. Patient has no swelling noted to the tongue or mouth.  Patient in NAD at this time with even and unlabored respirations.

## 2017-06-28 NOTE — ED Provider Notes (Signed)
[facility] Emergency Department Provider Note   ____________________________________________   First MD Initiated Contact with Patient 06/28/17 1832     (approximate)  I have reviewed the triage vital signs and the nursing notes.   HISTORY  Chief Complaint Chest Pain and Allergic Reaction    HPI Courtney Blanchard is a 62 y.o. female who reports that she used a new medicine with zinc oxide on her father.  She has never used that before.  She did have gloves on when she use the medicine and washed her hands afterwards.  She reports burning in her skin in her arms across the front of her chest and up into her neck.  There is also burning inside her stomach.  Redness is present in the areas of the burning.  She is not short of breath.  The chest pain she is having when I see her at least is the burning.  After Benadryl the burning gets a lot better as does the redness.  Past Medical History:  Diagnosis Date  . COPD (chronic obstructive pulmonary disease) (HCC)   . Coronary artery disease   . Diabetes mellitus without complication (HCC)   . Hypertension     Patient Active Problem List   Diagnosis Date Noted  . Chest pain 08/31/2016    History reviewed. No pertinent surgical history.  Prior to Admission medications   Medication Sig Start Date End Date Taking? Authorizing Provider  acetaminophen (TYLENOL) 500 MG tablet Take 500 mg by mouth every 6 (six) hours as needed.    [provider]  ALPRAZolam Prudy Feeler(XANAX) 0.5 MG tablet Take 0.5 mg by mouth 2 (two) times daily as needed. 07/04/16   [provider]  aspirin 81 MG tablet Take 81 mg by mouth daily.     [provider]  gabapentin (NEURONTIN) 300 MG capsule Take 300 mg by mouth 2 (two) times daily.    [provider]  PARoxetine (PAXIL) 20 MG tablet Take 20 mg by mouth daily. 02/28/16   [provider]    Allergies Amoxicillin  Family History  Problem  Relation Age of Onset  . Hypertension Mother   . Heart disease Father   . CAD Father   . Heart disease Sister     Social History Social History   Tobacco Use  . Smoking status: Never Smoker  . Smokeless tobacco: Never Used  Substance Use Topics  . Alcohol use: No  . Drug use: No    Review of Systems  Constitutional: No fever/chills Eyes: No visual changes. ENT: No sore throat. Cardiovascular: See HPI Respiratory: Denies shortness of breath. Gastrointestinal: No abdominal pain.  No nausea, no vomiting.  No diarrhea.  No constipation. Genitourinary: Negative for dysuria. Musculoskeletal: Negative for back pain. Skin: See HPI there are no hives.  There is just diffuse redness that blanches Neurological: Negative for headaches, focal weakness   ____________________________________________   PHYSICAL EXAM:  VITAL SIGNS: ED Triage Vitals  Enc Vitals Group     BP 06/28/17 1825 (!) 118/53     Pulse Rate 06/28/17 1825 80     Resp 06/28/17 1825 20     Temp 06/28/17 1825 98.3 F (36.8 C)     Temp Source 06/28/17 1825 Oral     SpO2 06/28/17 1825 100 %     Weight 06/28/17 1822 198 lb (89.8 kg)     Height 06/28/17 1822 5\' 2"  (1.575 m)     Head Circumference --  Peak Flow --      Pain Score 06/28/17 1822 9     Pain Loc --      Pain Edu? --      Excl. in GC? --     Constitutional: Alert and oriented. Well appearing and in no acute distress. Eyes: Conjunctivae are normal.  Head: Atraumatic. Nose: No congestion/rhinnorhea. Mouth/Throat: Mucous membranes are moist.  Oropharynx non-erythematous. Neck: No stridor.   Cardiovascular: Normal rate, regular rhythm. Grossly normal heart sounds.  Good peripheral circulation. Respiratory: Normal respiratory effort.  No retractions. Lungs CTAB. Gastrointestinal: Soft and nontender. No distention. No abdominal bruits. No CVA tenderness. Musculoskeletal: No lower extremity tenderness nor edema. Neurologic:  Normal speech and  language. No gross focal neurologic deficits are appreciated. Skin:  Skin is warm, dry and intact.  Diffuse redness of both arms there is some across the chest as well.  Her ears are red as her cheeks.  This all improved after Benadryl. Psychiatric: Mood and affect are normal. Speech and behavior are normal.  ____________________________________________   LABS (all labs ordered are listed, but only abnormal results are displayed)  Labs Reviewed  COMPREHENSIVE METABOLIC PANEL - Abnormal; Notable for the following components:      Result Value   Glucose, Bld 135 (*)    All other components within normal limits  CBC WITH DIFFERENTIAL/PLATELET  TROPONIN I  MAGNESIUM  LIPASE, BLOOD  TROPONIN I   ____________________________________________  EKG EKG read and interpreted by me shows normal sinus rhythm rate of 77 normal axis essentially normal EKG  ____________________________________________  RADIOLOGY  ED MD interpretation:    Official radiology report(s): Dg Chest 2 View  Result Date: 06/28/2017 CLINICAL DATA:  Patient complains of shortness of breath. EXAM: CHEST - 2 VIEW COMPARISON:  08/31/2016. FINDINGS: The heart size and mediastinal contours are within normal limits. Both lungs are clear. The visualized skeletal structures are unremarkable. Cardiac stent. IMPRESSION: No active cardiopulmonary disease.  Stable exam. Electronically Signed   By: Elsie Stain M.D.   On: 06/28/2017 20:25    ____________________________________________   PROCEDURES  Procedure(s) performed:   Procedures  Critical Care performed:   ____________________________________________   INITIAL IMPRESSION / ASSESSMENT AND PLAN / ED COURSE  ----------------------------------------- 7:45 PM on 06/28/2017 -----------------------------------------  Patient redness is now all gone.  Burning in her arms face and chest is better but she still having burning inside her abdomen.  I will check some  labs EKG etc. and give her a little bit of Ativan as she is having cramps in her legs and apparently Valium is on national back order. Patient better except for cramps. Labs pending, signed out to Dr Lenard Lance        ____________________________________________   FINAL CLINICAL IMPRESSION(S) / ED DIAGNOSES  Final diagnoses:  Allergic reaction, initial encounter     ED Discharge Orders    None       Note:  This document was prepared using Dragon voice recognition software and may include unintentional dictation errors.    Arnaldo Natal, MD 06/28/17 2117

## 2017-08-21 DIAGNOSIS — E119 Type 2 diabetes mellitus without complications: Secondary | ICD-10-CM | POA: Diagnosis not present

## 2017-08-21 DIAGNOSIS — F3342 Major depressive disorder, recurrent, in full remission: Secondary | ICD-10-CM | POA: Diagnosis not present

## 2017-08-21 DIAGNOSIS — I2510 Atherosclerotic heart disease of native coronary artery without angina pectoris: Secondary | ICD-10-CM | POA: Diagnosis not present

## 2017-08-21 DIAGNOSIS — G2581 Restless legs syndrome: Secondary | ICD-10-CM | POA: Diagnosis not present

## 2017-08-21 DIAGNOSIS — F329 Major depressive disorder, single episode, unspecified: Secondary | ICD-10-CM | POA: Diagnosis not present

## 2017-08-21 DIAGNOSIS — R252 Cramp and spasm: Secondary | ICD-10-CM | POA: Diagnosis not present

## 2017-08-21 DIAGNOSIS — F418 Other specified anxiety disorders: Secondary | ICD-10-CM | POA: Diagnosis not present

## 2017-08-22 DIAGNOSIS — I2510 Atherosclerotic heart disease of native coronary artery without angina pectoris: Secondary | ICD-10-CM | POA: Diagnosis not present

## 2017-08-22 DIAGNOSIS — E119 Type 2 diabetes mellitus without complications: Secondary | ICD-10-CM | POA: Diagnosis not present

## 2017-10-19 ENCOUNTER — Other Ambulatory Visit
Admission: RE | Admit: 2017-10-19 | Discharge: 2017-10-19 | Disposition: A | Discharge status: Home / Self Care | Payer: Medicare HMO | Source: Ambulatory Visit | Attending: Ophthalmology | Admitting: Ophthalmology

## 2017-10-19 DIAGNOSIS — H2513 Age-related nuclear cataract, bilateral: Secondary | ICD-10-CM | POA: Diagnosis not present

## 2017-10-19 DIAGNOSIS — M316 Other giant cell arteritis: Secondary | ICD-10-CM | POA: Insufficient documentation

## 2017-10-19 LAB — CBC
HEMATOCRIT: 39.6 % (ref 35.0–47.0)
Hemoglobin: 13.6 g/dL (ref 12.0–16.0)
MCH: 31.2 pg (ref 26.0–34.0)
MCHC: 34.4 g/dL (ref 32.0–36.0)
MCV: 90.8 fL (ref 80.0–100.0)
Platelets: 224 10*3/uL (ref 150–440)
RBC: 4.37 MIL/uL (ref 3.80–5.20)
RDW: 12.6 % (ref 11.5–14.5)
WBC: 5.2 10*3/uL (ref 3.6–11.0)

## 2017-10-19 LAB — C-REACTIVE PROTEIN: CRP: 1.1 mg/dL — ABNORMAL HIGH (ref <1.0)

## 2017-10-19 LAB — SEDIMENTATION RATE: SED RATE: 12 mm/h (ref 0–30)

## 2017-11-02 DIAGNOSIS — R21 Rash and other nonspecific skin eruption: Secondary | ICD-10-CM | POA: Diagnosis not present

## 2017-11-02 DIAGNOSIS — F418 Other specified anxiety disorders: Secondary | ICD-10-CM | POA: Diagnosis not present

## 2017-12-11 DIAGNOSIS — F329 Major depressive disorder, single episode, unspecified: Secondary | ICD-10-CM | POA: Diagnosis not present

## 2017-12-11 DIAGNOSIS — R21 Rash and other nonspecific skin eruption: Secondary | ICD-10-CM | POA: Diagnosis not present

## 2017-12-11 DIAGNOSIS — Z23 Encounter for immunization: Secondary | ICD-10-CM | POA: Diagnosis not present

## 2017-12-18 DIAGNOSIS — H2512 Age-related nuclear cataract, left eye: Secondary | ICD-10-CM | POA: Diagnosis not present

## 2017-12-19 DIAGNOSIS — R238 Other skin changes: Secondary | ICD-10-CM | POA: Diagnosis not present

## 2017-12-19 DIAGNOSIS — R21 Rash and other nonspecific skin eruption: Secondary | ICD-10-CM | POA: Diagnosis not present

## 2017-12-25 ENCOUNTER — Ambulatory Visit: Admit: 2017-12-25 | Payer: Medicare HMO | Admitting: Ophthalmology

## 2017-12-25 DIAGNOSIS — L710 Perioral dermatitis: Secondary | ICD-10-CM | POA: Diagnosis not present

## 2017-12-25 SURGERY — PHACOEMULSIFICATION, CATARACT, WITH IOL INSERTION
Anesthesia: Choice | Laterality: Left

## 2018-01-26 ENCOUNTER — Emergency Department: Payer: Medicare HMO

## 2018-01-26 ENCOUNTER — Inpatient Hospital Stay
Admission: EM | Admit: 2018-01-26 | Discharge: 2018-01-28 | DRG: 282 | Disposition: A | Discharge status: Home / Self Care | Payer: Medicare HMO | Attending: Internal Medicine | Admitting: Internal Medicine

## 2018-01-26 ENCOUNTER — Other Ambulatory Visit: Payer: Self-pay

## 2018-01-26 DIAGNOSIS — R079 Chest pain, unspecified: Secondary | ICD-10-CM | POA: Diagnosis present

## 2018-01-26 DIAGNOSIS — Z7982 Long term (current) use of aspirin: Secondary | ICD-10-CM | POA: Diagnosis not present

## 2018-01-26 DIAGNOSIS — R778 Other specified abnormalities of plasma proteins: Secondary | ICD-10-CM | POA: Diagnosis not present

## 2018-01-26 DIAGNOSIS — R001 Bradycardia, unspecified: Secondary | ICD-10-CM | POA: Diagnosis present

## 2018-01-26 DIAGNOSIS — R7989 Other specified abnormal findings of blood chemistry: Secondary | ICD-10-CM | POA: Diagnosis not present

## 2018-01-26 DIAGNOSIS — F17210 Nicotine dependence, cigarettes, uncomplicated: Secondary | ICD-10-CM | POA: Diagnosis present

## 2018-01-26 DIAGNOSIS — F419 Anxiety disorder, unspecified: Secondary | ICD-10-CM | POA: Diagnosis present

## 2018-01-26 DIAGNOSIS — E1140 Type 2 diabetes mellitus with diabetic neuropathy, unspecified: Secondary | ICD-10-CM | POA: Diagnosis present

## 2018-01-26 DIAGNOSIS — I25119 Atherosclerotic heart disease of native coronary artery with unspecified angina pectoris: Secondary | ICD-10-CM | POA: Diagnosis present

## 2018-01-26 DIAGNOSIS — Z8249 Family history of ischemic heart disease and other diseases of the circulatory system: Secondary | ICD-10-CM

## 2018-01-26 DIAGNOSIS — Z955 Presence of coronary angioplasty implant and graft: Secondary | ICD-10-CM

## 2018-01-26 DIAGNOSIS — Z716 Tobacco abuse counseling: Secondary | ICD-10-CM

## 2018-01-26 DIAGNOSIS — Z79899 Other long term (current) drug therapy: Secondary | ICD-10-CM

## 2018-01-26 DIAGNOSIS — I214 Non-ST elevation (NSTEMI) myocardial infarction: Secondary | ICD-10-CM | POA: Diagnosis present

## 2018-01-26 DIAGNOSIS — J449 Chronic obstructive pulmonary disease, unspecified: Secondary | ICD-10-CM | POA: Diagnosis present

## 2018-01-26 DIAGNOSIS — Z880 Allergy status to penicillin: Secondary | ICD-10-CM | POA: Diagnosis not present

## 2018-01-26 DIAGNOSIS — R0789 Other chest pain: Secondary | ICD-10-CM

## 2018-01-26 DIAGNOSIS — R0602 Shortness of breath: Secondary | ICD-10-CM | POA: Diagnosis not present

## 2018-01-26 DIAGNOSIS — Z823 Family history of stroke: Secondary | ICD-10-CM | POA: Diagnosis not present

## 2018-01-26 DIAGNOSIS — I10 Essential (primary) hypertension: Secondary | ICD-10-CM | POA: Diagnosis present

## 2018-01-26 LAB — BASIC METABOLIC PANEL
Anion gap: 8 (ref 5–15)
BUN: 17 mg/dL (ref 6–20)
CO2: 28 mmol/L (ref 22–32)
Calcium: 9 mg/dL (ref 8.9–10.3)
Chloride: 104 mmol/L (ref 98–111)
Creatinine, Ser: 0.68 mg/dL (ref 0.44–1.00)
GFR calc Af Amer: >60 mL/min (ref >60)
GFR calc non Af Amer: >60 mL/min (ref >60)
Glucose, Bld: 99 mg/dL (ref 70–99)
Potassium: 4.1 mmol/L (ref 3.5–5.1)
SODIUM: 140 mmol/L (ref 135–145)

## 2018-01-26 LAB — APTT: aPTT: 34 seconds (ref 24–36)

## 2018-01-26 LAB — CBC
HEMATOCRIT: 42.4 % (ref 36.0–46.0)
HEMOGLOBIN: 13.7 g/dL (ref 12.0–15.0)
MCH: 30.3 pg (ref 26.0–34.0)
MCHC: 32.3 g/dL (ref 30.0–36.0)
MCV: 93.8 fL (ref 80.0–100.0)
NRBC: 0 % (ref 0.0–0.2)
Platelets: 214 10*3/uL (ref 150–400)
RBC: 4.52 MIL/uL (ref 3.87–5.11)
RDW: 12.1 % (ref 11.5–15.5)
WBC: 4.3 10*3/uL (ref 4.0–10.5)

## 2018-01-26 LAB — HEPARIN LEVEL (UNFRACTIONATED): Heparin Unfractionated: 0.23 IU/mL — ABNORMAL LOW (ref 0.30–0.70)

## 2018-01-26 LAB — TROPONIN I
TROPONIN I: 0.43 ng/mL — AB (ref <0.03)
TROPONIN I: 0.98 ng/mL — AB (ref <0.03)
Troponin I: 0.04 ng/mL (ref <0.03)
Troponin I: 1.09 ng/mL (ref <0.03)

## 2018-01-26 LAB — PROTIME-INR
INR: 0.95
Prothrombin Time: 12.6 seconds (ref 11.4–15.2)

## 2018-01-26 MED ORDER — GABAPENTIN 300 MG PO CAPS
600.0000 mg | ORAL_CAPSULE | Freq: Every day | ORAL | Status: DC
  Administered 2018-01-26 – 2018-01-27 (×2): 600 mg via ORAL
  Filled 2018-01-26 (×2): qty 2

## 2018-01-26 MED ORDER — NITROGLYCERIN 0.4 MG SL SUBL: 0.4000 mg | SUBLINGUAL_TABLET | SUBLINGUAL | Status: DC | PRN

## 2018-01-26 MED ORDER — ASPIRIN 81 MG PO CHEW
81.0000 mg | CHEWABLE_TABLET | Freq: Every day | ORAL | Status: DC
  Administered 2018-01-27 – 2018-01-28 (×2): 81 mg via ORAL
  Filled 2018-01-26 (×2): qty 1

## 2018-01-26 MED ORDER — HEPARIN (PORCINE) 25000 UT/250ML-% IV SOLN
1150.0000 [IU]/h | INTRAVENOUS | Status: DC
  Administered 2018-01-26: 850 [IU]/h via INTRAVENOUS
  Administered 2018-01-27: 1150 [IU]/h via INTRAVENOUS
  Filled 2018-01-26 (×2): qty 250

## 2018-01-26 MED ORDER — ACETAMINOPHEN 650 MG RE SUPP: 650.0000 mg | Freq: Four times a day (QID) | RECTAL | Status: DC | PRN

## 2018-01-26 MED ORDER — ONDANSETRON HCL 4 MG/2ML IJ SOLN
4.0000 mg | Freq: Four times a day (QID) | INTRAMUSCULAR | Status: DC | PRN
  Administered 2018-01-26: 4 mg via INTRAVENOUS
  Filled 2018-01-26: qty 2

## 2018-01-26 MED ORDER — ACETAMINOPHEN 325 MG PO TABS
650.0000 mg | ORAL_TABLET | Freq: Four times a day (QID) | ORAL | Status: DC | PRN
Start: 2018-01-26 — End: 2018-01-28
  Administered 2018-01-26 – 2018-01-28 (×3): 650 mg via ORAL
  Filled 2018-01-26 (×3): qty 2

## 2018-01-26 MED ORDER — KETOROLAC TROMETHAMINE 30 MG/ML IJ SOLN
30.0000 mg | Freq: Once | INTRAMUSCULAR | Status: AC
  Administered 2018-01-26: 30 mg via INTRAVENOUS
  Filled 2018-01-26: qty 1

## 2018-01-26 MED ORDER — ALPRAZOLAM 0.5 MG PO TABS
0.5000 mg | ORAL_TABLET | Freq: Two times a day (BID) | ORAL | Status: DC | PRN
  Administered 2018-01-26 – 2018-01-27 (×3): 0.5 mg via ORAL
  Filled 2018-01-26 (×3): qty 1

## 2018-01-26 MED ORDER — PAROXETINE HCL 20 MG PO TABS
40.0000 mg | ORAL_TABLET | Freq: Every day | ORAL | Status: DC
  Administered 2018-01-26 – 2018-01-28 (×3): 40 mg via ORAL
  Filled 2018-01-26 (×3): qty 2

## 2018-01-26 MED ORDER — HEPARIN BOLUS VIA INFUSION
1000.0000 [IU] | Freq: Once | INTRAVENOUS | Status: AC
  Administered 2018-01-26: 1000 [IU] via INTRAVENOUS
  Filled 2018-01-26: qty 1000

## 2018-01-26 MED ORDER — NITROGLYCERIN 2 % TD OINT
0.5000 [in_us] | TOPICAL_OINTMENT | Freq: Four times a day (QID) | TRANSDERMAL | Status: DC | PRN
  Administered 2018-01-26 – 2018-01-27 (×3): 0.5 [in_us] via TOPICAL
  Filled 2018-01-26 (×3): qty 1

## 2018-01-26 MED ORDER — ONDANSETRON HCL 4 MG PO TABS: 4.0000 mg | ORAL_TABLET | Freq: Four times a day (QID) | ORAL | Status: DC | PRN

## 2018-01-26 MED ORDER — KETOROLAC TROMETHAMINE 30 MG/ML IJ SOLN
30.0000 mg | Freq: Four times a day (QID) | INTRAMUSCULAR | Status: DC | PRN
  Administered 2018-01-27 (×2): 30 mg via INTRAVENOUS
  Filled 2018-01-26 (×2): qty 1

## 2018-01-26 MED ORDER — ENOXAPARIN SODIUM 40 MG/0.4ML ~~LOC~~ SOLN: 40.0000 mg | SUBCUTANEOUS | Status: DC

## 2018-01-26 MED ORDER — HEPARIN BOLUS VIA INFUSION
4000.0000 [IU] | Freq: Once | INTRAVENOUS | Status: AC
  Administered 2018-01-26: 4000 [IU] via INTRAVENOUS
  Filled 2018-01-26: qty 4000

## 2018-01-26 NOTE — Progress Notes (Signed)
ANTICOAGULATION CONSULT NOTE  Pharmacy Consult for heparin Indication: chest pain/ACS  Allergies  Allergen Reactions  . Amoxicillin     Has patient had a PCN reaction causing immediate rash, facial/tongue/throat swelling, SOB or lightheadedness with hypotension: Yes Has patient had a PCN reaction causing severe rash involving mucus membranes or skin necrosis: No Has patient had a PCN reaction that required hospitalization: No Has patient had a PCN reaction occurring within the last 10 years: No If all of the above answers are "NO", then may proceed with Cephalosporin use.  Sore throat     Patient Measurements: Height: 5\' 2"  (157.5 cm) Weight: 198 lb 8 oz (90 kg) IBW/kg (Calculated) : 50.1 Heparin Dosing Weight: 70.8 kg  Vital Signs: Temp: 98.3 F (36.8 C) (11/16 1934) Temp Source: Oral (11/16 1934) BP: 132/65 (11/16 1934) Pulse Rate: 50 (11/16 1934)  Labs: Recent Labs    01/26/18 0922 01/26/18 1406 01/26/18 1540 01/26/18 1716 01/26/18 2106 01/26/18 2146  HGB 13.7  --   --   --   --   --   HCT 42.4  --   --   --   --   --   PLT 214  --   --   --   --   --   APTT  --   --  34  --   --   --   LABPROT  --   --  12.6  --   --   --   INR  --   --  0.95  --   --   --   HEPARINUNFRC  --   --   --   --   --  0.23*  CREATININE 0.68  --   --   --   --   --   TROPONINI 0.04* 0.43*  --  0.98* 1.09*  --     Estimated Creatinine Clearance: 78 mL/min (by C-G formula based on SCr of 0.68 mg/dL).   Medical History: Past Medical History:  Diagnosis Date  . COPD (chronic obstructive pulmonary disease) (HCC)   . Coronary artery disease   . Diabetes mellitus without complication (HCC)   . Hypertension     Assessment: 62 year old female admitted with chest pain. Troponin elevated. No PTA anticoagulation in home med list. Pharmacy consulted for heparin drip.  Goal of Therapy:  Heparin level 0.3-0.7 units/ml Monitor platelets by anticoagulation protocol: Yes   Plan:   11/16 @  2146 HL 0.23. Level is subtherapeutic. Will order Heparin 1000 unit bolus and increase heparin drip at 1000 units/hr. Recheck HL in 6 hours.    CBC with morning labs per protocol.  Gardner CandleSheema M Danamarie Minami, PharmD, BCPS Clinical Pharmacist 01/26/2018 10:39 PM

## 2018-01-26 NOTE — ED Notes (Signed)
Patient transported to X-ray 

## 2018-01-26 NOTE — ED Notes (Signed)
Dr Madaline GuthrieSidaecki notified of elevated troponin 0.04 - no new orders at this time

## 2018-01-26 NOTE — Progress Notes (Signed)
Talked to Dr. Anne HahnWillis about patient's 6/10 chest pressure radiating on her bilateral arms and left jaw, asked if patient can have nitro paste, order given. RN will continue to monitor.

## 2018-01-26 NOTE — ED Provider Notes (Signed)
[facility] Emergency Department Provider Note ____________________________________________   First MD Initiated Contact with Patient 01/26/18 [phone]     (approximate)  I have reviewed the triage vital signs and the nursing notes.   HISTORY  Chief Complaint Chest Pain    HPI Courtney Blanchard is a 62 y.o. female with PMH as noted below including CAD with stenting in 2004 who presents with chest pain, acute onset approximately 30 minutes prior to coming to the hospital, described as both pressure-like and sharp, lasting for approximately 20 minutes, now significantly improved.  She reports some associated shortness of breath but denies nausea or vomiting.  She states that she was just standing in her bedroom and was not exerting herself.  She denies any leg pain or swelling.   Past Medical History:  Diagnosis Date  . COPD (chronic obstructive pulmonary disease) (HCC)   . Coronary artery disease   . Diabetes mellitus without complication (HCC)   . Hypertension     Patient Active Problem List   Diagnosis Date Noted  . Chest pain 08/31/2016    History reviewed. No pertinent surgical history.  Prior to Admission medications   Medication Sig Start Date End Date Taking? Authorizing Provider  acetaminophen (TYLENOL) 500 MG tablet Take 500 mg by mouth every 6 (six) hours as needed.    [provider]  ALPRAZolam Prudy Feeler(XANAX) 0.5 MG tablet Take 0.5 mg by mouth 2 (two) times daily as needed. 07/04/16   [provider]  aspirin 81 MG tablet Take 81 mg by mouth daily.     [provider]  gabapentin (NEURONTIN) 300 MG capsule Take 300 mg by mouth 2 (two) times daily.    [provider]  PARoxetine (PAXIL) 20 MG tablet Take 20 mg by mouth daily. 02/28/16   [provider]    Allergies Amoxicillin  Family History  Problem Relation Age of Onset  . Hypertension Mother   . Heart disease Father   . CAD Father   . Heart  disease Sister     Social History Social History   Tobacco Use  . Smoking status: Never Smoker  . Smokeless tobacco: Never Used  Substance Use Topics  . Alcohol use: No  . Drug use: No    Review of Systems  Constitutional: No fever. Eyes: No redness. ENT: No sore throat. Cardiovascular: Positive for chest pain. Respiratory: Positive for shortness of breath. Gastrointestinal: No vomiting or diarrhea.  Genitourinary: Negative for flank pain.  Musculoskeletal: Negative for back pain. Skin: Negative for rash. Neurological: Negative for headache.   ____________________________________________   PHYSICAL EXAM:  VITAL SIGNS: ED Triage Vitals  Enc Vitals Group     BP 01/26/18 0902 (!) 156/93     Pulse Rate 01/26/18 0902 (!) 56     Resp 01/26/18 0902 17     Temp 01/26/18 0902 (!) 97.5 F (36.4 C)     Temp Source 01/26/18 0902 Oral     SpO2 01/26/18 0902 98 %     Weight 01/26/18 0857 194 lb (88 kg)     Height 01/26/18 0857 5\' 2"  (1.575 m)     Head Circumference --      Peak Flow --      Pain Score 01/26/18 0856 7     Pain Loc --      Pain Edu? --      Excl. in GC? --     Constitutional: Alert and oriented. Well appearing and in no  acute distress. Eyes: Conjunctivae are normal.  Head: Atraumatic. Nose: No congestion/rhinnorhea. Mouth/Throat: Mucous membranes are moist.   Neck: Normal range of motion.  Cardiovascular: Normal rate, regular rhythm. Grossly normal heart sounds.  Good peripheral circulation. Respiratory: Normal respiratory effort.  No retractions. Lungs CTAB. Gastrointestinal: No distention.  Musculoskeletal: No lower extremity edema.  Extremities warm and well perfused.  Neurologic:  Normal speech and language. No gross focal neurologic deficits are appreciated.  Skin:  Skin is warm and dry. No rash noted. Psychiatric: Mood and affect are normal. Speech and behavior are normal.  ____________________________________________   LABS (all labs  ordered are listed, but only abnormal results are displayed)  Labs Reviewed  TROPONIN I - Abnormal; Notable for the following components:      Result Value   Troponin I 0.04 (*)    All other components within normal limits  BASIC METABOLIC PANEL  CBC   ____________________________________________  EKG  ED ECG REPORT I, Dionne Bucy, the attending physician, personally viewed and interpreted this ECG.  Date: 01/26/2018 EKG Time: 0858 Rate: 54 Rhythm: normal sinus rhythm QRS Axis: normal Intervals: normal ST/T Wave abnormalities: normal Narrative Interpretation: no evidence of acute ischemia  ____________________________________________  RADIOLOGY  CXR: No focal infiltrate or other acute abnormality  ____________________________________________   PROCEDURES  Procedure(s) performed: No  Procedures  Critical Care performed: No ____________________________________________   INITIAL IMPRESSION / ASSESSMENT AND PLAN / ED COURSE  Pertinent labs & imaging results that were available during my care of the patient were reviewed by me and considered in my medical decision making (see chart for details).  62 year old female with PMH as noted above including CAD status post stenting presents with nonexertional chest pain acute onset this morning which is now resolving.  The patient received aspirin by EMS.  On exam she is relatively well-appearing.  She is hypertensive but the other vital signs are normal.  The remainder of the exam is unremarkable.  EKG is nonischemic.  I reviewed the past medical records in Epic; the patient was most recently admitted for chest pain and bradycardia in June 2018, and had a negative Myoview stress test.  Given the patient's history and risk factors, differential includes ACS/unstable angina, versus GERD or other GI etiology, or musculoskeletal pain.  Given the resolving nature of the pain and the patient's vital signs there is no  clinical evidence for vascular etiology or PE.  Plan: Chest x-ray, basic labs, cardiac enzymes x2 and reassess.  ----------------------------------------- 10:44 AM on 01/26/2018 -----------------------------------------  The patient's initial troponin is slightly elevated.  Given her high risk for ACS, this warrants admission for further monitoring and ACS rule out.  The patient agrees with this plan.  I signed the patient out to the hospitalist Dr. Cherlynn Kaiser.  ____________________________________________   FINAL CLINICAL IMPRESSION(S) / ED DIAGNOSES  Final diagnoses:  Atypical chest pain      NEW MEDICATIONS STARTED DURING THIS VISIT:  New Prescriptions   No medications on file     Note:  This document was prepared using Dragon voice recognition software and may include unintentional dictation errors.    Dionne Bucy, MD 01/26/18 1045

## 2018-01-26 NOTE — Progress Notes (Signed)
2nd. Trop is up to 0.43.    Discussed with Dr. Lady GaryFath and will start Heparin gtt.  Plan for possible cath on Monday.

## 2018-01-26 NOTE — ED Triage Notes (Signed)
Pt arrived via ems for report of chest pain that radiates into the left shoulder/arm and back - the pain started 30-40 min ago - denies N/V - pt given 324mg  of asa in route

## 2018-01-26 NOTE — Progress Notes (Signed)
ANTICOAGULATION CONSULT NOTE  Pharmacy Consult for heparin Indication: chest pain/ACS  Allergies  Allergen Reactions  . Amoxicillin     Has patient had a PCN reaction causing immediate rash, facial/tongue/throat swelling, SOB or lightheadedness with hypotension: Yes Has patient had a PCN reaction causing severe rash involving mucus membranes or skin necrosis: No Has patient had a PCN reaction that required hospitalization: No Has patient had a PCN reaction occurring within the last 10 years: No If all of the above answers are "NO", then may proceed with Cephalosporin use.  Sore throat     Patient Measurements: Height: 5\' 2"  (157.5 cm) Weight: 198 lb 8 oz (90 kg) IBW/kg (Calculated) : 50.1 Heparin Dosing Weight: 70.8 kg  Vital Signs: Temp: 98.4 F (36.9 C) (11/16 1322) Temp Source: Oral (11/16 1322) BP: 106/57 (11/16 1322) Pulse Rate: 57 (11/16 1322)  Labs: Recent Labs    01/26/18 0922 01/26/18 1406  HGB 13.7  --   HCT 42.4  --   PLT 214  --   CREATININE 0.68  --   TROPONINI 0.04* 0.43*    Estimated Creatinine Clearance: 78 mL/min (by C-G formula based on SCr of 0.68 mg/dL).   Medical History: Past Medical History:  Diagnosis Date  . COPD (chronic obstructive pulmonary disease) (HCC)   . Coronary artery disease   . Diabetes mellitus without complication (HCC)   . Hypertension     Assessment: 62 year old female admitted with chest pain. Troponin elevated. No PTA anticoagulation in home med list. Pharmacy consulted for heparin drip.  Goal of Therapy:  Heparin level 0.3-0.7 units/ml Monitor platelets by anticoagulation protocol: Yes   Plan:  Will give heparin 4000 unit bolus x 1 followed by heparin drip at 850 units/hr. Heparin level ordered for 2200. CBC with morning labs.  Pricilla RiffleAbby K Ellarae Nevitt, PharmD Pharmacy Resident  01/26/2018 3:37 PM
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
## 2018-01-26 NOTE — Consult Note (Signed)
Cardiology Consultation Note    Patient ID: Courtney Blanchard, MRN: [id number], DOB/AGE: Aug 16, 1955 62 y.o. Admit date: 01/26/2018   Date of Consult: 01/26/2018 Primary Physician: Jerl Mina, MD Primary Cardiologist: None  Chief Complaint: chest pain Reason for Consultation: chest pain Requesting MD: Dr. Cherlynn Kaiser  HPI: Courtney Blanchard is a 62 y.o. female with history of reported coronary artery disease with reported PCI done in the past, records currently not available, history of diabetes, COPD, hypertension, tobacco abuse who presents to the emergency room with complaints of chest pain.  She describes it as midsternal chest discomfort without radiation.  Is not related to exertion but she did have shortness of breath with it.  Outpatient medications include Paxil, Neurontin, Xanax and aspirin.  EKG reveals sinus bradycardia with nonspecific ST-T wave changes.  Laboratories revealed a serum troponin of 0.04.  Continues to have mild chest heaviness although denies nausea or vomiting.  She is eating as I do the exam.  Past Medical History:  Diagnosis Date  . COPD (chronic obstructive pulmonary disease) (HCC)   . Coronary artery disease   . Diabetes mellitus without complication (HCC)   . Hypertension       Surgical History: History reviewed. No pertinent surgical history.   Home Meds: Prior to Admission medications   Medication Sig Start Date End Date Taking? Authorizing Provider  aspirin 81 MG tablet Take 81 mg by mouth daily.    Yes [provider]  doxycycline (VIBRAMYCIN) 100 MG capsule Take 100 mg by mouth 2 (two) times daily.   Yes [provider]  acetaminophen (TYLENOL) 500 MG tablet Take 500 mg by mouth every 6 (six) hours as needed.    [provider]  ALPRAZolam Prudy Feeler) 0.5 MG tablet Take 0.5 mg by mouth 2 (two) times daily as needed. 07/04/16   [provider]  gabapentin (NEURONTIN) 300 MG capsule Take 600 mg by mouth at bedtime.      [provider]  PARoxetine (PAXIL) 20 MG tablet Take 40 mg by mouth daily.  02/28/16   [provider]    Inpatient Medications:     Allergies:  Allergies  Allergen Reactions  . Amoxicillin     Has patient had a PCN reaction causing immediate rash, facial/tongue/throat swelling, SOB or lightheadedness with hypotension: Yes Has patient had a PCN reaction causing severe rash involving mucus membranes or skin necrosis: No Has patient had a PCN reaction that required hospitalization: No Has patient had a PCN reaction occurring within the last 10 years: No If all of the above answers are "NO", then may proceed with Cephalosporin use.  Sore throat     Social History   Socioeconomic History  . Marital status: Married    Spouse name: Not on file  . Number of children: Not on file  . Years of education: Not on file  . Highest education level: Not on file  Occupational History  . Not on file  Social Needs  . Financial resource strain: Not on file  . Food insecurity:    Worry: Not on file    Inability: Not on file  . Transportation needs:    Medical: Not on file    Non-medical: Not on file  Tobacco Use  . Smoking status: Never Smoker  . Smokeless tobacco: Never Used  Substance and Sexual Activity  . Alcohol use: No  . Drug use: No  . Sexual activity: Never  Lifestyle  . Physical activity:  Days per week: Not on file    Minutes per session: Not on file  . Stress: Not on file  Relationships  . Social connections:    Talks on phone: Not on file    Gets together: Not on file    Attends religious service: Not on file    Active member of club or organization: Not on file    Attends meetings of clubs or organizations: Not on file    Relationship status: Not on file  . Intimate partner violence:    Fear of current or ex partner: Not on file    Emotionally abused: Not on file    Physically abused: Not on file    Forced sexual activity: Not on file   Other Topics Concern  . Not on file  Social History Narrative  . Not on file     Family History  Problem Relation Age of Onset  . Hypertension Mother   . Heart disease Father   . CAD Father   . Heart disease Sister   . Stroke Sister      Review of Systems: A 12-system review of systems was performed and is negative except as noted in the HPI.  Labs: Recent Labs    01/26/18 0922  TROPONINI 0.04*   Lab Results  Component Value Date   WBC 4.3 01/26/2018   HGB 13.7 01/26/2018   HCT 42.4 01/26/2018   MCV 93.8 01/26/2018   PLT 214 01/26/2018    Recent Labs  Lab 01/26/18 0922  NA 140  K 4.1  CL 104  CO2 28  BUN 17  CREATININE 0.68  CALCIUM 9.0  GLUCOSE 99   No results found for: CHOL, HDL, LDLCALC, TRIG No results found for: DDIMER  Radiology/Studies:  Dg Chest 2 View  Result Date: 01/26/2018 CLINICAL DATA:  Chest pain and shortness of breath with radiation into the left shoulder EXAM: CHEST - 2 VIEW COMPARISON:  06/28/2017 FINDINGS: Cardiac shadow is stable. The lungs are well aerated bilaterally. No focal infiltrate or sizable effusion is seen. No acute bony abnormality is noted. IMPRESSION: No acute abnormality seen Electronically Signed   By: Alcide CleverMark  Lukens M.D.   On: 01/26/2018 09:23    Wt Readings from Last 3 Encounters:  01/26/18 88 kg  06/28/17 89.8 kg  08/31/16 88.3 kg    EKG: Sinus bradycardia with no ischemia  Physical Exam:  Blood pressure 113/69, pulse 62, temperature (!) 97.5 F (36.4 C), temperature source Oral, resp. rate 18, height 5\' 2"  (1.575 m), weight 88 kg, SpO2 98 %. Body mass index is 35.48 kg/m. General: Well developed, well nourished, in no acute distress. Head: Normocephalic, atraumatic, sclera non-icteric, no xanthomas, nares are without discharge.  Neck: Negative for carotid bruits. JVD not elevated. Lungs: Clear bilaterally to auscultation without wheezes, rales, or rhonchi. Breathing is unlabored. Heart: RRR with S1 S2. No  murmurs, rubs, or gallops appreciated. Abdomen: Soft, non-tender, non-distended with normoactive bowel sounds. No hepatomegaly. No rebound/guarding. No obvious abdominal masses. Msk:  Strength and tone appear normal for age. Extremities: No clubbing or cyanosis. No edema.  Distal pedal pulses are 2+ and equal bilaterally. Neuro: Alert and oriented X 3. No facial asymmetry. No focal deficit. Moves all extremities spontaneously. Psych:  Responds to questions appropriately with a normal affect.     Assessment and Plan  62 year old female with reported previous PCI who presents to the emergency room with complaints of chest pain.  Pain is nonexertional.  Started at  rest.  Electrocardiogram shows sinus bradycardia with no ischemic changes.  Patient continues to smoke.  Apparently takes an aspirin.  Initial serum troponin 0 0.04.  Would continue to rule out for myocardial infarction.  Would proceed with functional study either as an inpatient or as outpatient depending on symptoms and cardiac markers.  Smoking cessation is recommended.  Continue with enteric-coated aspirin.  Signed, Dalia Heading MD 01/26/2018, 12:56 PM Pager: [phone]

## 2018-01-26 NOTE — ED Notes (Signed)
Unable to call report at this time due to RN being unavailable.

## 2018-01-26 NOTE — Progress Notes (Signed)
CRITICAL VALUE ALERT  Critical Value:  Troponin 0.98  Date & Time Notied:  01/26/2018 1857  Provider Notified: Dr. Cherlynn KaiserSainani  Orders Received/Actions taken: Patient on heparin drip, cardiology is planning to have cardiac cath Monday. Patient asymptomatic. RN will continue to monitor.

## 2018-01-26 NOTE — Progress Notes (Signed)
CRITICAL VALUE ALERT  Critical Value:  Troponin 1.09  Date & Time Notied:  01/26/2018  Provider Notified: Dr. Anne HahnWillis   Orders Received/Actions taken: will continue to monitor.

## 2018-01-26 NOTE — H&P (Signed)
[employer] - [place] [facility]    PATIENT NAME: Courtney Blanchard    MR#:  [id number]  DATE OF BIRTH:  02/18/1956  DATE OF ADMISSION:  01/26/2018  PRIMARY CARE PHYSICIAN: Jerl Mina, MD   REQUESTING/REFERRING PHYSICIAN: Dr. Marisa Severin  CHIEF COMPLAINT:   Chief Complaint  Patient presents with  . Chest Pain    HISTORY OF PRESENT ILLNESS:  Courtney Blanchard  is a 61 y.o. female with a known history of coronary artery disease, COPD, diabetes, hypertension who presents to the hospital due to chest pain.  Patient says she was in her usual state of health when this morning around 9 AM she developed chest pain/pressure which radiated to the neck into her left arm area.  She felt like somebody was sitting on her chest and had significant chest pressure.  She associated some nausea some shortness of breath with her symptoms.  The symptoms got progressively worse and so that she was concerned and came to the ER for further evaluation.  Patient was given some nitroglycerin and also aspirin by EMS and her chest pain/pressure has improved.  Her initial troponin was noted to be mildly elevated 0.04 and hospitalist services were contacted for admission.  Patient also admits to significant fatigue and weakness over the past few weeks.  She admits to this weakness even at rest and worse with minimal exertion.  Given her significant risk factors and mildly elevated troponin and ongoing symptoms hospitalist services were contacted for admission.  PAST MEDICAL HISTORY:   Past Medical History:  Diagnosis Date  . COPD (chronic obstructive pulmonary disease) (HCC)   . Coronary artery disease   . Diabetes mellitus without complication (HCC)   . Hypertension     PAST SURGICAL HISTORY:  History reviewed. No pertinent surgical history.  SOCIAL HISTORY:   Social History   Tobacco Use  . Smoking status: Never Smoker  . Smokeless tobacco: Never Used  Substance Use Topics  . Alcohol  use: No    FAMILY HISTORY:   Family History  Problem Relation Age of Onset  . Hypertension Mother   . Heart disease Father   . CAD Father   . Heart disease Sister   . Stroke Sister     DRUG ALLERGIES:   Allergies  Allergen Reactions  . Amoxicillin     Has patient had a PCN reaction causing immediate rash, facial/tongue/throat swelling, SOB or lightheadedness with hypotension: Yes Has patient had a PCN reaction causing severe rash involving mucus membranes or skin necrosis: No Has patient had a PCN reaction that required hospitalization: No Has patient had a PCN reaction occurring within the last 10 years: No If all of the above answers are "NO", then may proceed with Cephalosporin use.  Sore throat     REVIEW OF SYSTEMS:   Review of Systems  Constitutional: Negative for fever and weight loss.  HENT: Negative for congestion, nosebleeds and tinnitus.   Eyes: Negative for blurred vision, double vision and redness.  Respiratory: Negative for cough, hemoptysis and shortness of breath.   Cardiovascular: Positive for chest pain. Negative for orthopnea, leg swelling and PND.  Gastrointestinal: Negative for abdominal pain, diarrhea, melena, nausea and vomiting.  Genitourinary: Negative for dysuria, hematuria and urgency.  Musculoskeletal: Negative for falls and joint pain.  Neurological: Positive for weakness (Generalized). Negative for dizziness, tingling, sensory change, focal weakness, seizures and headaches.  Endo/Heme/Allergies: Negative for polydipsia. Does not bruise/bleed easily.  Psychiatric/Behavioral: Negative for depression and memory loss. The  patient is not nervous/anxious.     MEDICATIONS AT HOME:   Prior to Admission medications   Medication Sig Start Date End Date Taking? Authorizing Provider  aspirin 81 MG tablet Take 81 mg by mouth daily.    Yes [provider]  doxycycline (VIBRAMYCIN) 100 MG capsule Take 100 mg by mouth 2 (two) times daily.   Yes  [provider]  acetaminophen (TYLENOL) 500 MG tablet Take 500 mg by mouth every 6 (six) hours as needed.    [provider]  ALPRAZolam Prudy Feeler) 0.5 MG tablet Take 0.5 mg by mouth 2 (two) times daily as needed. 07/04/16   [provider]  gabapentin (NEURONTIN) 300 MG capsule Take 600 mg by mouth at bedtime.     [provider]  PARoxetine (PAXIL) 20 MG tablet Take 40 mg by mouth daily.  02/28/16   [provider]      VITAL SIGNS:  Blood pressure 125/74, pulse (!) 55, temperature (!) 97.5 F (36.4 C), temperature source Oral, resp. rate 16, height 5\' 2"  (1.575 m), weight 88 kg, SpO2 99 %.  PHYSICAL EXAMINATION:  Physical Exam  GENERAL:  62 y.o.-year-old patient lying in the bed in no acute distress.  EYES: Pupils equal, round, reactive to light and accommodation. No scleral icterus. Extraocular muscles intact.  HEENT: Head atraumatic, normocephalic. Oropharynx and nasopharynx clear. No oropharyngeal erythema, moist oral mucosa  NECK:  Supple, no jugular venous distention. No thyroid enlargement, no tenderness.  LUNGS: Normal breath sounds bilaterally, no wheezing, rales, rhonchi. No use of accessory muscles of respiration.  CARDIOVASCULAR: S1, S2 RRR. No murmurs, rubs, gallops, clicks.  ABDOMEN: Soft, nontender, nondistended. Bowel sounds present. No organomegaly or mass.  EXTREMITIES: No pedal edema, cyanosis, or clubbing. + 2 pedal & radial pulses b/l.   NEUROLOGIC: Cranial nerves II through XII are intact. No focal Motor or sensory deficits appreciated b/l PSYCHIATRIC: The patient is alert and oriented x 3.  SKIN: No obvious rash, lesion, or ulcer.   LABORATORY PANEL:   CBC Recent Labs  Lab 01/26/18 0922  WBC 4.3  HGB 13.7  HCT 42.4  PLT 214   ------------------------------------------------------------------------------------------------------------------  Chemistries  Recent Labs  Lab 01/26/18 0922  NA 140  K 4.1  CL 104   CO2 28  GLUCOSE 99  BUN 17  CREATININE 0.68  CALCIUM 9.0   ------------------------------------------------------------------------------------------------------------------  Cardiac Enzymes Recent Labs  Lab 01/26/18 0922  TROPONINI 0.04*   ------------------------------------------------------------------------------------------------------------------  RADIOLOGY:  Dg Chest 2 View  Result Date: 01/26/2018 CLINICAL DATA:  Chest pain and shortness of breath with radiation into the left shoulder EXAM: CHEST - 2 VIEW COMPARISON:  06/28/2017 FINDINGS: Cardiac shadow is stable. The lungs are well aerated bilaterally. No focal infiltrate or sizable effusion is seen. No acute bony abnormality is noted. IMPRESSION: No acute abnormality seen Electronically Signed   By: Alcide Clever M.D.   On: 01/26/2018 09:23     IMPRESSION AND PLAN:   62 year old female with past medical history of COPD, diabetes, hypertension, history of coronary artery disease with previous stent placement who presents to the hospital complaining of chest pain.  1.  Chest pain-patient has significant risk factors given her significant family history and also previous history of coronary artery disease.  Her symptoms although somewhat atypical for angina but her symptoms did improve with nitroglycerin and aspirin. - Patient's EKG shows normal sinus rhythm with no acute ST or T wave changes but she has a mildly elevated troponin. -  We will observe her overnight on telemetry, cycle her cardiac markers.  Continue aspirin, nitro -She had a nuclear medicine stress test done in November of last year which was benign.  I will not repeat a stress test for now.  Would consult cardiology.  Cycle her markers, check an echocardiogram.  2.  Anxiety-continue Paxil, Xanax.  3.  Neuropathy-continue gabapentin.  4.  Essential hypertension- hold metoprolol for now given her relative bradycardia.  Patient is presently hemodynamically  stable.    All the records are reviewed and case discussed with ED provider. Management plans discussed with the patient, family and they are in agreement.  CODE STATUS: Full code  TOTAL TIME TAKING CARE OF THIS PATIENT: 40 minutes.    Houston SirenSAINANI,Tarisa Paola J M.D on 01/26/2018 at 11:46 AM  Between 7am to 6pm - Pager - [phone]  After 6pm go to [email or web address] - password [email or web address] [facility]  [facility]  Office  [phone]  CC: Primary care physician; Jerl MinaHedrick, James, MD
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
## 2018-01-27 ENCOUNTER — Other Ambulatory Visit: Payer: Self-pay

## 2018-01-27 ENCOUNTER — Observation Stay
Admit: 2018-01-27 | Discharge: 2018-01-27 | Disposition: A | Discharge status: Home / Self Care | Payer: Medicare HMO | Attending: Specialist | Admitting: Specialist

## 2018-01-27 DIAGNOSIS — Z955 Presence of coronary angioplasty implant and graft: Secondary | ICD-10-CM | POA: Diagnosis not present

## 2018-01-27 DIAGNOSIS — Z880 Allergy status to penicillin: Secondary | ICD-10-CM | POA: Diagnosis not present

## 2018-01-27 DIAGNOSIS — F419 Anxiety disorder, unspecified: Secondary | ICD-10-CM | POA: Diagnosis present

## 2018-01-27 DIAGNOSIS — Z7982 Long term (current) use of aspirin: Secondary | ICD-10-CM | POA: Diagnosis not present

## 2018-01-27 DIAGNOSIS — I214 Non-ST elevation (NSTEMI) myocardial infarction: Secondary | ICD-10-CM | POA: Diagnosis present

## 2018-01-27 DIAGNOSIS — R0789 Other chest pain: Secondary | ICD-10-CM | POA: Diagnosis present

## 2018-01-27 DIAGNOSIS — Z823 Family history of stroke: Secondary | ICD-10-CM | POA: Diagnosis not present

## 2018-01-27 DIAGNOSIS — R001 Bradycardia, unspecified: Secondary | ICD-10-CM | POA: Diagnosis present

## 2018-01-27 DIAGNOSIS — E1140 Type 2 diabetes mellitus with diabetic neuropathy, unspecified: Secondary | ICD-10-CM | POA: Diagnosis present

## 2018-01-27 DIAGNOSIS — Z8249 Family history of ischemic heart disease and other diseases of the circulatory system: Secondary | ICD-10-CM | POA: Diagnosis not present

## 2018-01-27 DIAGNOSIS — F17210 Nicotine dependence, cigarettes, uncomplicated: Secondary | ICD-10-CM | POA: Diagnosis present

## 2018-01-27 DIAGNOSIS — Z716 Tobacco abuse counseling: Secondary | ICD-10-CM | POA: Diagnosis not present

## 2018-01-27 DIAGNOSIS — J449 Chronic obstructive pulmonary disease, unspecified: Secondary | ICD-10-CM | POA: Diagnosis present

## 2018-01-27 DIAGNOSIS — I10 Essential (primary) hypertension: Secondary | ICD-10-CM | POA: Diagnosis present

## 2018-01-27 DIAGNOSIS — I25119 Atherosclerotic heart disease of native coronary artery with unspecified angina pectoris: Secondary | ICD-10-CM | POA: Diagnosis present

## 2018-01-27 DIAGNOSIS — Z79899 Other long term (current) drug therapy: Secondary | ICD-10-CM | POA: Diagnosis not present

## 2018-01-27 LAB — BASIC METABOLIC PANEL
ANION GAP: 7 (ref 5–15)
BUN: 23 mg/dL — ABNORMAL HIGH (ref 6–20)
CALCIUM: 8.9 mg/dL (ref 8.9–10.3)
CO2: 28 mmol/L (ref 22–32)
Chloride: 104 mmol/L (ref 98–111)
Creatinine, Ser: 0.84 mg/dL (ref 0.44–1.00)
Glucose, Bld: 103 mg/dL — ABNORMAL HIGH (ref 70–99)
Potassium: 4.8 mmol/L (ref 3.5–5.1)
Sodium: 139 mmol/L (ref 135–145)

## 2018-01-27 LAB — HEPARIN LEVEL (UNFRACTIONATED)
Heparin Unfractionated: 0.39 IU/mL (ref 0.30–0.70)
Heparin Unfractionated: 0.27 IU/mL — ABNORMAL LOW (ref 0.30–0.70)
Heparin Unfractionated: 0.46 IU/mL (ref 0.30–0.70)

## 2018-01-27 LAB — CBC
HCT: 39.7 % (ref 36.0–46.0)
Hemoglobin: 12.7 g/dL (ref 12.0–15.0)
MCH: 30.3 pg (ref 26.0–34.0)
MCHC: 32 g/dL (ref 30.0–36.0)
MCV: 94.7 fL (ref 80.0–100.0)
NRBC: 0 % (ref 0.0–0.2)
PLATELETS: 213 10*3/uL (ref 150–400)
RBC: 4.19 MIL/uL (ref 3.87–5.11)
RDW: 12.3 % (ref 11.5–15.5)
WBC: 4.4 10*3/uL (ref 4.0–10.5)

## 2018-01-27 LAB — ECHOCARDIOGRAM COMPLETE
HEIGHTINCHES: 62 in
Weight: 3156.8 oz

## 2018-01-27 MED ORDER — SODIUM CHLORIDE 0.9% FLUSH
3.0000 mL | Freq: Two times a day (BID) | INTRAVENOUS | Status: DC
  Administered 2018-01-27 – 2018-01-28 (×2): 3 mL via INTRAVENOUS

## 2018-01-27 MED ORDER — BUTALBITAL-APAP-CAFFEINE 50-325-40 MG PO TABS
1.0000 | ORAL_TABLET | Freq: Four times a day (QID) | ORAL | Status: DC | PRN
  Administered 2018-01-27 (×2): 1 via ORAL
  Filled 2018-01-27 (×3): qty 1

## 2018-01-27 MED ORDER — HEPARIN BOLUS VIA INFUSION
1000.0000 [IU] | Freq: Once | INTRAVENOUS | Status: AC
  Administered 2018-01-27: 1000 [IU] via INTRAVENOUS
  Filled 2018-01-27: qty 1000

## 2018-01-27 MED ORDER — ATORVASTATIN CALCIUM 20 MG PO TABS
40.0000 mg | ORAL_TABLET | Freq: Every day | ORAL | Status: DC
  Administered 2018-01-27: 40 mg via ORAL
  Filled 2018-01-27: qty 2

## 2018-01-27 NOTE — Progress Notes (Signed)
ANTICOAGULATION CONSULT NOTE  Pharmacy Consult for heparin Indication: chest pain/ACS  Allergies  Allergen Reactions  . Amoxicillin     Has patient had a PCN reaction causing immediate rash, facial/tongue/throat swelling, SOB or lightheadedness with hypotension: Yes Has patient had a PCN reaction causing severe rash involving mucus membranes or skin necrosis: No Has patient had a PCN reaction that required hospitalization: No Has patient had a PCN reaction occurring within the last 10 years: No If all of the above answers are "NO", then may proceed with Cephalosporin use.  Sore throat     Patient Measurements: Height: 5\' 2"  (157.5 cm) Weight: 197 lb 4.8 oz (89.5 kg) IBW/kg (Calculated) : 50.1 Heparin Dosing Weight: 70.8 kg  Vital Signs: Temp: 98.4 F (36.9 C) (11/17 0733) Temp Source: Oral (11/17 0733) BP: 117/83 (11/17 0733) Pulse Rate: 49 (11/17 0733)  Labs: Recent Labs    01/26/18 0922 01/26/18 1406 01/26/18 1540 01/26/18 1716 01/26/18 2106 01/26/18 2146 01/27/18 0519 01/27/18 1121  HGB 13.7  --   --   --   --   --  12.7  --   HCT 42.4  --   --   --   --   --  39.7  --   PLT 214  --   --   --   --   --  213  --   APTT  --   --  34  --   --   --   --   --   LABPROT  --   --  12.6  --   --   --   --   --   INR  --   --  0.95  --   --   --   --   --   HEPARINUNFRC  --   --   --   --   --  0.23* 0.39 0.27*  CREATININE 0.68  --   --   --   --   --  0.84  --   TROPONINI 0.04* 0.43*  --  0.98* 1.09*  --   --   --     Estimated Creatinine Clearance: 74.1 mL/min (by C-G formula based on SCr of 0.84 mg/dL).   Medical History: Past Medical History:  Diagnosis Date  . COPD (chronic obstructive pulmonary disease) (HCC)   . Coronary artery disease   . Diabetes mellitus without complication (HCC)   . Hypertension     Assessment: 62 year old female admitted with chest pain. Troponin elevated. No PTA anticoagulation in home med list. Pharmacy consulted for heparin  drip.  Goal of Therapy:  Heparin level 0.3-0.7 units/ml Monitor platelets by anticoagulation protocol: Yes   Plan:  11/17 @ 0600  HL 0.39. Level now therapeutic x1. Will continue heparin infusion @ 1000 units/hr. Recheck confirmatory HL in 6 hours.    11/17 @ 11:21 HL = 0.27.  Give bolus of 1000 units and increase drip rate to 1150 units/hr. Recheck HL in 6 hours tonight at 18:00.  CBC with morning labs per protocol.  Stormy CardKatsoudas,Ifeanyichukwu Wickham K, [facility] Clinical Pharmacist 01/27/2018 11:54 AM

## 2018-01-27 NOTE — Progress Notes (Signed)
Patient Name: Courtney Blanchard Date of Encounter: 01/27/2018  Hospital Problem List     Active Problems:   Chest pain   NSTEMI (non-ST elevated myocardial infarction) [facility])    Patient Profile     62 year old female with history of coronary artery disease status post PCI in 2004 in 2005 now admitted with chest pain and ruled in for a non-ST elevation myocardial infarction with peak troponin of 1.09.  Echo pending.  Currently stable hemodynamically.  Subjective   Chest pain is improved.  Inpatient Medications    . aspirin  81 mg Oral Daily  . atorvastatin  40 mg Oral q1800  . gabapentin  600 mg Oral QHS  . PARoxetine  40 mg Oral Daily    Vital Signs    Vitals:   01/26/18 1934 01/27/18 0337 01/27/18 0733 01/27/18 1623  BP: 132/65 101/66 117/83 114/66  Pulse: (!) 50 (!) 51 (!) 49 (!) 57  Resp: 18 18 16 18   Temp: 98.3 F (36.8 C)  98.4 F (36.9 C) 99.1 F (37.3 C)  TempSrc: Oral  Oral Oral  SpO2: 98% 95% 100% 97%  Weight:  89.5 kg    Height:        Intake/Output Summary (Last 24 hours) at 01/27/2018 1729 Last data filed at 01/27/2018 0641 Gross per 24 hour  Intake 76.82 ml  Output 400 ml  Net -323.18 ml   Filed Weights   01/26/18 0857 01/26/18 1322 01/27/18 0337  Weight: 88 kg 90 kg 89.5 kg    Physical Exam    GEN: Well nourished, well developed, in no acute distress.  HEENT: normal.  Neck: Supple, no JVD, carotid bruits, or masses. Cardiac: RRR, no murmurs, rubs, or gallops. No clubbing, cyanosis, edema.  Radials/DP/PT 2+ and equal bilaterally.  Respiratory:  Respirations regular and unlabored, clear to auscultation bilaterally. GI: Soft, nontender, nondistended, BS + x 4. MS: no deformity or atrophy. Skin: warm and dry, no rash. Neuro:  Strength and sensation are intact. Psych: Normal affect.  Labs    CBC Recent Labs    01/26/18 0922 01/27/18 0519  WBC 4.3 4.4  HGB 13.7 12.7  HCT 42.4 39.7  MCV 93.8 94.7  PLT 214 213   Basic Metabolic  Panel Recent Labs    01/26/18 0922 01/27/18 0519  NA 140 139  K 4.1 4.8  CL 104 104  CO2 28 28  GLUCOSE 99 103*  BUN 17 23*  CREATININE 0.68 0.84  CALCIUM 9.0 8.9   Liver Function Tests No results for input(s): AST, ALT, ALKPHOS, BILITOT, PROT, ALBUMIN in the last 72 hours. No results for input(s): LIPASE, AMYLASE in the last 72 hours. Cardiac Enzymes Recent Labs    01/26/18 1406 01/26/18 1716 01/26/18 2106  TROPONINI 0.43* 0.98* 1.09*   BNP No results for input(s): BNP in the last 72 hours. D-Dimer No results for input(s): DDIMER in the last 72 hours. Hemoglobin A1C No results for input(s): HGBA1C in the last 72 hours. Fasting Lipid Panel No results for input(s): CHOL, HDL, LDLCALC, TRIG, CHOLHDL, LDLDIRECT in the last 72 hours. Thyroid Function Tests No results for input(s): TSH, T4TOTAL, T3FREE, THYROIDAB in the last 72 hours.  Invalid input(s): FREET3  Telemetry    Normal sinus rhythm.  ECG    Normal sinus rhythm with no obvious ischemic changes.  Radiology    Dg Chest 2 View  Result Date: 01/26/2018 CLINICAL DATA:  Chest pain and shortness of breath with radiation into the left  shoulder EXAM: CHEST - 2 VIEW COMPARISON:  06/28/2017 FINDINGS: Cardiac shadow is stable. The lungs are well aerated bilaterally. No focal infiltrate or sizable effusion is seen. No acute bony abnormality is noted. IMPRESSION: No acute abnormality seen Electronically Signed   By: Alcide CleverMark  Lukens M.D.   On: 01/26/2018 09:23    Assessment & Plan    62 year old female with history of coronary disease by stenting in the past now presenting with chest pain with associated shortness of breath.  Ruled in for non-ST elevation myocardial infarction.  Continue with heparin.  Proceed with left heart cath to evaluate coronary anatomy with further recommendations after this is complete.  Smoking cessation is recommended.  We will continue with aspirin and high intensity statin.  Further  recommendations after cath.  Signed, Darlin PriestlyKenneth A. Livier Hendel MD 01/27/2018, 5:29 PM  Pager: [phone]

## 2018-01-27 NOTE — Progress Notes (Signed)
[employer] [employer] - [place] [facility]   PATIENT NAME: Courtney Blanchard    MR#:  [id number]  DATE OF BIRTH:  06-12-1955  SUBJECTIVE:  CHIEF COMPLAINT:   Chief Complaint  Patient presents with  . Chest Pain  Patient seen today Has chest pain No shortness of breath   REVIEW OF SYSTEMS:    ROS  CONSTITUTIONAL: No documented fever. No fatigue, weakness. No weight gain, no weight loss.  EYES: No blurry or double vision.  ENT: No tinnitus. No postnasal drip. No redness of the oropharynx.  RESPIRATORY: No cough, no wheeze, no hemoptysis. No dyspnea.  CARDIOVASCULAR: Has chest pain. No orthopnea. No palpitations. No syncope.  GASTROINTESTINAL: No nausea, no vomiting or diarrhea. No abdominal pain. No melena or hematochezia.  GENITOURINARY: No dysuria or hematuria.  ENDOCRINE: No polyuria or nocturia. No heat or cold intolerance.  HEMATOLOGY: No anemia. No bruising. No bleeding.  INTEGUMENTARY: No rashes. No lesions.  MUSCULOSKELETAL: No arthritis. No swelling. No gout.  NEUROLOGIC: No numbness, tingling, or ataxia. No seizure-type activity.  PSYCHIATRIC: No anxiety. No insomnia. No ADD.   DRUG ALLERGIES:   Allergies  Allergen Reactions  . Amoxicillin     Has patient had a PCN reaction causing immediate rash, facial/tongue/throat swelling, SOB or lightheadedness with hypotension: Yes Has patient had a PCN reaction causing severe rash involving mucus membranes or skin necrosis: No Has patient had a PCN reaction that required hospitalization: No Has patient had a PCN reaction occurring within the last 10 years: No If all of the above answers are "NO", then may proceed with Cephalosporin use.  Sore throat     VITALS:  Blood pressure 117/83, pulse (!) 49, temperature 98.4 F (36.9 C), temperature source Oral, resp. rate 16, height 5\' 2"  (1.575 m), weight 89.5 kg, SpO2 100 %.  PHYSICAL EXAMINATION:   Physical Exam  GENERAL:  62 y.o.-year-old patient lying in  the bed with no acute distress.  EYES: Pupils equal, round, reactive to light and accommodation. No scleral icterus. Extraocular muscles intact.  HEENT: Head atraumatic, normocephalic. Oropharynx and nasopharynx clear.  NECK:  Supple, no jugular venous distention. No thyroid enlargement, no tenderness.  LUNGS: Normal breath sounds bilaterally, no wheezing, rales, rhonchi. No use of accessory muscles of respiration.  CARDIOVASCULAR: S1, S2 normal. No murmurs, rubs, or gallops.  ABDOMEN: Soft, nontender, nondistended. Bowel sounds present. No organomegaly or mass.  EXTREMITIES: No cyanosis, clubbing or edema b/l.    NEUROLOGIC: Cranial nerves II through XII are intact. No focal Motor or sensory deficits b/l.   PSYCHIATRIC: The patient is alert and oriented x 3.  SKIN: No obvious rash, lesion, or ulcer.   LABORATORY PANEL:   CBC Recent Labs  Lab 01/27/18 0519  WBC 4.4  HGB 12.7  HCT 39.7  PLT 213   ------------------------------------------------------------------------------------------------------------------ Chemistries  Recent Labs  Lab 01/27/18 0519  NA 139  K 4.8  CL 104  CO2 28  GLUCOSE 103*  BUN 23*  CREATININE 0.84  CALCIUM 8.9   ------------------------------------------------------------------------------------------------------------------  Cardiac Enzymes Recent Labs  Lab 01/26/18 2106  TROPONINI 1.09*   ------------------------------------------------------------------------------------------------------------------  RADIOLOGY:  Dg Chest 2 View  Result Date: 01/26/2018 CLINICAL DATA:  Chest pain and shortness of breath with radiation into the left shoulder EXAM: CHEST - 2 VIEW COMPARISON:  06/28/2017 FINDINGS: Cardiac shadow is stable. The lungs are well aerated bilaterally. No focal infiltrate or sizable effusion is seen. No acute bony abnormality is noted. IMPRESSION: No acute abnormality seen Electronically  Signed   By: Alcide Clever M.D.   On:  01/26/2018 09:23     ASSESSMENT AND PLAN:   62 year old female patient with history of coronary disease, COPD, diabetes mellitus type 2, hypertension presented to the emergency room for chest  -Non-STEMI Status post cardiology evaluation Cardiac cath in a.m. N.p.o. after midnight Continue aspirin, statin and nitrates IV heparin drip for anticoagulation  -COPD stable Continue home dose inhalers  -Type 2 diabetes mellitus Diabetic diet with sliding scale coverage with insulin  -Hypertension Continue blood pressure meds  All the records are reviewed and case discussed with Care Management/Social Worker. Management plans discussed with the patient, family and they are in agreement.  CODE STATUS: Full code  DVT Prophylaxis: SCDs  TOTAL TIME TAKING CARE OF THIS PATIENT: 35 minutes.   POSSIBLE D/C IN 1 to 2 DAYS, DEPENDING ON CLINICAL CONDITION.  Ihor Austin M.D on 01/27/2018 at 1:15 PM  Between 7am to 6pm - Pager - [phone]  After 6pm go to [email or web address] - password [email or web address]  [facility]  Office  [phone]  CC: Primary care physician; Jerl Mina, MD  Note: This dictation was prepared with Dragon dictation along with smaller phrase technology. Any transcriptional errors that result from this process are unintentional.

## 2018-01-27 NOTE — Progress Notes (Signed)
ANTICOAGULATION CONSULT NOTE  Pharmacy Consult for heparin Indication: chest pain/ACS  Allergies  Allergen Reactions  . Amoxicillin     Has patient had a PCN reaction causing immediate rash, facial/tongue/throat swelling, SOB or lightheadedness with hypotension: Yes Has patient had a PCN reaction causing severe rash involving mucus membranes or skin necrosis: No Has patient had a PCN reaction that required hospitalization: No Has patient had a PCN reaction occurring within the last 10 years: No If all of the above answers are "NO", then may proceed with Cephalosporin use.  Sore throat     Patient Measurements: Height: 5\' 2"  (157.5 cm) Weight: 198 lb 8 oz (90 kg) IBW/kg (Calculated) : 50.1 Heparin Dosing Weight: 70.8 kg  Vital Signs: Temp: 98.3 F (36.8 C) (11/16 1934) Temp Source: Oral (11/16 1934) BP: 101/66 (11/17 0337) Pulse Rate: 51 (11/17 0337)  Labs: Recent Labs    01/26/18 0922 01/26/18 1406 01/26/18 1540 01/26/18 1716 01/26/18 2106 01/26/18 2146 01/27/18 0519  HGB 13.7  --   --   --   --   --  12.7  HCT 42.4  --   --   --   --   --  39.7  PLT 214  --   --   --   --   --  213  APTT  --   --  34  --   --   --   --   LABPROT  --   --  12.6  --   --   --   --   INR  --   --  0.95  --   --   --   --   HEPARINUNFRC  --   --   --   --   --  0.23* 0.39  CREATININE 0.68  --   --   --   --   --   --   TROPONINI 0.04* 0.43*  --  0.98* 1.09*  --   --     Estimated Creatinine Clearance: 78 mL/min (by C-G formula based on SCr of 0.68 mg/dL).   Medical History: Past Medical History:  Diagnosis Date  . COPD (chronic obstructive pulmonary disease) (HCC)   . Coronary artery disease   . Diabetes mellitus without complication (HCC)   . Hypertension     Assessment: 62 year old female admitted with chest pain. Troponin elevated. No PTA anticoagulation in home med list. Pharmacy consulted for heparin drip.  Goal of Therapy:  Heparin level 0.3-0.7 units/ml Monitor  platelets by anticoagulation protocol: Yes   Plan:  11/17 @ 0600  HL 0.39. Level now therapeutic x1. Will continue heparin infusion @ 1000 units/hr. Recheck confirmatory HL in 6 hours.    CBC with morning labs per protocol.  Gardner CandleSheema M Le Faulcon, PharmD, BCPS Clinical Pharmacist 01/27/2018 6:01 AM

## 2018-01-27 NOTE — Progress Notes (Addendum)
Pt states that tylenol does not work well with her headache. Notify prime. Will continue to monitor.  0700: Re- page prime about pt request. Will continue to monitor.  0702: Dr. Sheryle Hailiamond ordered Fioricet, Esgic 50-325-40 mg 1 tablet every 6 hours as needed for headache. Will continue to monitor.

## 2018-01-27 NOTE — Progress Notes (Signed)
Patient Name: Lenore CordiaVickie A Bustamante Date of Encounter: 01/27/2018  Hospital Problem List     Active Problems:   Chest pain    Patient Profile     62 year old female with history of coronary artery disease status post coronary artery stenting in 2000 coronary 2005 per her report of the records are currently not available who was admitted with chest pain typical of her angina.    Subjective  62 year old female with history of coronary artery disease status post coronary artery stenting in 2000 coronary 2005 per her report of the records are currently not available who was admitted with chest pain typical of her angina.   She had a mild serum troponin elevation to 1.09.  EKG showed no acute changes.  Echo is pending.  She continues to smoke cigarettes.  Aspirin.  Currently stable.  Inpatient Medications    . aspirin  81 mg Oral Daily  . gabapentin  600 mg Oral QHS  . PARoxetine  40 mg Oral Daily    Vital Signs    Vitals:   01/26/18 1610 01/26/18 1934 01/27/18 0337 01/27/18 0733  BP: 107/72 132/65 101/66 117/83  Pulse: (!) 56 (!) 50 (!) 51 (!) 49  Resp: 14 18 18 16   Temp: 98.5 F (36.9 C) 98.3 F (36.8 C)  98.4 F (36.9 C)  TempSrc: Oral Oral  Oral  SpO2: 100% 98% 95% 100%  Weight:   89.5 kg   Height:        Intake/Output Summary (Last 24 hours) at 01/27/2018 1031 Last data filed at 01/27/2018 0641 Gross per 24 hour  Intake 76.82 ml  Output 400 ml  Net -323.18 ml   Filed Weights   01/26/18 0857 01/26/18 1322 01/27/18 0337  Weight: 88 kg 90 kg 89.5 kg    Physical Exam    GEN: Well nourished, well developed, in no acute distress.  HEENT: normal.  Neck: Supple, no JVD, carotid bruits, or masses. Cardiac: RRR, no murmurs, rubs, or gallops. No clubbing, cyanosis, edema.  Radials/DP/PT 2+ and equal bilaterally.  Respiratory:  Respirations regular and unlabored, clear to auscultation bilaterally. GI: Soft, nontender, nondistended, BS + x 4. MS: no deformity or  atrophy. Skin: warm and dry, no rash. Neuro:  Strength and sensation are intact. Psych: Normal affect.  Labs    CBC Recent Labs    01/26/18 0922 01/27/18 0519  WBC 4.3 4.4  HGB 13.7 12.7  HCT 42.4 39.7  MCV 93.8 94.7  PLT 214 213   Basic Metabolic Panel Recent Labs    40/98/1111/16/19 0922 01/27/18 0519  NA 140 139  K 4.1 4.8  CL 104 104  CO2 28 28  GLUCOSE 99 103*  BUN 17 23*  CREATININE 0.68 0.84  CALCIUM 9.0 8.9   Liver Function Tests No results for input(s): AST, ALT, ALKPHOS, BILITOT, PROT, ALBUMIN in the last 72 hours. No results for input(s): LIPASE, AMYLASE in the last 72 hours. Cardiac Enzymes Recent Labs    01/26/18 1406 01/26/18 1716 01/26/18 2106  TROPONINI 0.43* 0.98* 1.09*   BNP No results for input(s): BNP in the last 72 hours. D-Dimer No results for input(s): DDIMER in the last 72 hours. Hemoglobin A1C No results for input(s): HGBA1C in the last 72 hours. Fasting Lipid Panel No results for input(s): CHOL, HDL, LDLCALC, TRIG, CHOLHDL, LDLDIRECT in the last 72 hours. Thyroid Function Tests No results for input(s): TSH, T4TOTAL, T3FREE, THYROIDAB in the last 72 hours.  Invalid input(s): [employer]REET3  Telemetry  Sinus rhythm  ECG    Normal sinus rhythm with no ischemia  Radiology    Dg Chest 2 View  Result Date: 01/26/2018 CLINICAL DATA:  Chest pain and shortness of breath with radiation into the left shoulder EXAM: CHEST - 2 VIEW COMPARISON:  06/28/2017 FINDINGS: Cardiac shadow is stable. The lungs are well aerated bilaterally. No focal infiltrate or sizable effusion is seen. No acute bony abnormality is noted. IMPRESSION: No acute abnormality seen Electronically Signed   By: Alcide Clever M.D.   On: 01/26/2018 09:23    Assessment & Plan    62 year old female history of coronary disease by her report status post PCI in 2004 2005 with records currently not available who was admitted with complaints of chest pain.  Has ruled in for a non-ST  elevation myocardial infarction.  Currently stable on heparin.  Echo is pending.  We will continue with current regimen including aspirin and will add high intensity statin.  He is bradycardic so therefore will defer beta-blocker.  Further recommendations after cardiac cath.  Benefits and risks of catheter were discussed with patient.  Plan is for left heart cath in the morning.  Signed, Darlin Priestly Joeann Steppe MD 01/27/2018, 10:31 AM  Pager: [phone]

## 2018-01-27 NOTE — Progress Notes (Signed)
ANTICOAGULATION CONSULT NOTE  Pharmacy Consult for heparin Indication: chest pain/ACS  Allergies  Allergen Reactions  . Amoxicillin     Has patient had a PCN reaction causing immediate rash, facial/tongue/throat swelling, SOB or lightheadedness with hypotension: Yes Has patient had a PCN reaction causing severe rash involving mucus membranes or skin necrosis: No Has patient had a PCN reaction that required hospitalization: No Has patient had a PCN reaction occurring within the last 10 years: No If all of the above answers are "NO", then may proceed with Cephalosporin use.  Sore throat     Patient Measurements: Height: 5\' 2"  (157.5 cm) Weight: 197 lb 4.8 oz (89.5 kg) IBW/kg (Calculated) : 50.1 Heparin Dosing Weight: 70.8 kg  Vital Signs: Temp: 98.1 F (36.7 C) (11/17 1953) Temp Source: Oral (11/17 1953) BP: 112/63 (11/17 1953) Pulse Rate: 63 (11/17 1953)  Labs: Recent Labs    01/26/18 0922 01/26/18 1406 01/26/18 1540 01/26/18 1716 01/26/18 2106  01/27/18 0519 01/27/18 1121 01/27/18 1811  HGB 13.7  --   --   --   --   --  12.7  --   --   HCT 42.4  --   --   --   --   --  39.7  --   --   PLT 214  --   --   --   --   --  213  --   --   APTT  --   --  34  --   --   --   --   --   --   LABPROT  --   --  12.6  --   --   --   --   --   --   INR  --   --  0.95  --   --   --   --   --   --   HEPARINUNFRC  --   --   --   --   --    < > 0.39 0.27* 0.46  CREATININE 0.68  --   --   --   --   --  0.84  --   --   TROPONINI 0.04* 0.43*  --  0.98* 1.09*  --   --   --   --    < > = values in this interval not displayed.    Estimated Creatinine Clearance: 74.1 mL/min (by C-G formula based on SCr of 0.84 mg/dL).   Medical History: Past Medical History:  Diagnosis Date  . COPD (chronic obstructive pulmonary disease) (HCC)   . Coronary artery disease   . Diabetes mellitus without complication (HCC)   . Hypertension     Assessment: 62 year old female admitted with chest  pain. Troponin elevated. No PTA anticoagulation in home med list. Pharmacy consulted for heparin drip.  Goal of Therapy:  Heparin level 0.3-0.7 units/ml Monitor platelets by anticoagulation protocol: Yes   Plan:  11/17 @ 0600  HL 0.39. Level now therapeutic x1. Will continue heparin infusion @ 1000 units/hr. Recheck confirmatory HL in 6 hours.    11/17 @ 11:21 HL = 0.27.  Give bolus of 1000 units and increase drip rate to 1150 units/hr. Recheck HL in 6 hours tonight at 18:00.  11/17 @1811  HL 0.46. Level within goal. Will continue current rate. Recheck in 6 hours.   CBC with morning labs per protocol.  Ronnald RampKishan S Teddie Curd, [facility] LLCRPH Clinical Pharmacist 01/27/2018 7:57 PM

## 2018-01-27 NOTE — Progress Notes (Signed)
Advanced care plan.  Purpose of the Encounter: CODE STATUS  Parties in Attendance:Patient  Patient's Decision Capacity:Good  Subjective/Patient's story: Presented to emergency room with chest pain   Objective/Medical story Patient has elevated troponin and non-STEMI Needs cardiology work-up   Goals of care determination:  Advance care directives and goals of care discussed Patient wants everything done which includes CPR, intubation ventilator the need arises   CODE STATUS: Full code   Time spent discussing advanced care planning: 16 minutes

## 2018-01-28 ENCOUNTER — Encounter: Payer: Self-pay | Admitting: Cardiology

## 2018-01-28 ENCOUNTER — Encounter
Admission: EM | Disposition: A | Discharge status: Home / Self Care | Payer: Self-pay | Source: Home / Self Care | Attending: Internal Medicine

## 2018-01-28 HISTORY — PX: LEFT HEART CATH AND CORONARY ANGIOGRAPHY: CATH118249

## 2018-01-28 LAB — CBC
HEMATOCRIT: 38.9 % (ref 36.0–46.0)
Hemoglobin: 12.4 g/dL (ref 12.0–15.0)
MCH: 30.5 pg (ref 26.0–34.0)
MCHC: 31.9 g/dL (ref 30.0–36.0)
MCV: 95.6 fL (ref 80.0–100.0)
Platelets: 199 10*3/uL (ref 150–400)
RBC: 4.07 MIL/uL (ref 3.87–5.11)
RDW: 12.1 % (ref 11.5–15.5)
WBC: 5.3 10*3/uL (ref 4.0–10.5)
nRBC: 0 % (ref 0.0–0.2)

## 2018-01-28 LAB — HIV ANTIBODY (ROUTINE TESTING W REFLEX): HIV Screen 4th Generation wRfx: NONREACTIVE

## 2018-01-28 LAB — HEPARIN LEVEL (UNFRACTIONATED): Heparin Unfractionated: 0.33 IU/mL (ref 0.30–0.70)

## 2018-01-28 SURGERY — LEFT HEART CATH AND CORONARY ANGIOGRAPHY
Anesthesia: Moderate Sedation

## 2018-01-28 MED ORDER — IOPAMIDOL (ISOVUE-300) INJECTION 61%
INTRAVENOUS | Status: DC | PRN
  Administered 2018-01-28: 65 mL via INTRA_ARTERIAL

## 2018-01-28 MED ORDER — HEPARIN (PORCINE) IN NACL 1000-0.9 UT/500ML-% IV SOLN
INTRAVENOUS | Status: AC
  Filled 2018-01-28: qty 1000

## 2018-01-28 MED ORDER — FENTANYL CITRATE (PF) 100 MCG/2ML IJ SOLN
INTRAMUSCULAR | Status: AC
  Filled 2018-01-28: qty 2

## 2018-01-28 MED ORDER — ISOSORBIDE MONONITRATE ER 30 MG PO TB24
30.0000 mg | ORAL_TABLET | Freq: Every day | ORAL | Status: DC
  Administered 2018-01-28: 30 mg via ORAL
  Filled 2018-01-28 (×2): qty 1

## 2018-01-28 MED ORDER — VERAPAMIL HCL 2.5 MG/ML IV SOLN
INTRAVENOUS | Status: AC
  Filled 2018-01-28: qty 2

## 2018-01-28 MED ORDER — HEPARIN SODIUM (PORCINE) 1000 UNIT/ML IJ SOLN
INTRAMUSCULAR | Status: AC
  Filled 2018-01-28: qty 1

## 2018-01-28 MED ORDER — ASPIRIN 81 MG PO CHEW: 81.0000 mg | CHEWABLE_TABLET | ORAL | Status: DC

## 2018-01-28 MED ORDER — SODIUM CHLORIDE 0.9% FLUSH: 3.0000 mL | Freq: Two times a day (BID) | INTRAVENOUS | Status: DC

## 2018-01-28 MED ORDER — MIDAZOLAM HCL 2 MG/2ML IJ SOLN
INTRAMUSCULAR | Status: AC
  Filled 2018-01-28: qty 2

## 2018-01-28 MED ORDER — ISOSORBIDE MONONITRATE ER 30 MG PO TB24: 30.0000 mg | ORAL_TABLET | Freq: Every day | ORAL | 0 refills | Status: DC

## 2018-01-28 MED ORDER — SODIUM CHLORIDE 0.9 % WEIGHT BASED INFUSION: 1.0000 mL/kg/h | INTRAVENOUS | Status: DC

## 2018-01-28 MED ORDER — SODIUM CHLORIDE 0.9 % WEIGHT BASED INFUSION
1.0000 mL/kg/h | INTRAVENOUS | Status: DC
  Administered 2018-01-28: 1 mL/kg/h via INTRAVENOUS

## 2018-01-28 MED ORDER — SODIUM CHLORIDE 0.9 % WEIGHT BASED INFUSION
3.0000 mL/kg/h | INTRAVENOUS | Status: AC
  Administered 2018-01-28: 3 mL/kg/h via INTRAVENOUS

## 2018-01-28 MED ORDER — ATORVASTATIN CALCIUM 40 MG PO TABS: 40.0000 mg | ORAL_TABLET | Freq: Every day | ORAL | 0 refills | Status: DC

## 2018-01-28 MED ORDER — SODIUM CHLORIDE 0.9 % WEIGHT BASED INFUSION
3.0000 mL/kg/h | INTRAVENOUS | Status: DC
  Administered 2018-01-28: 3 mL/kg/h via INTRAVENOUS

## 2018-01-28 MED ORDER — SODIUM CHLORIDE 0.9% FLUSH: 3.0000 mL | INTRAVENOUS | Status: DC | PRN

## 2018-01-28 MED ORDER — FENTANYL CITRATE (PF) 100 MCG/2ML IJ SOLN
INTRAMUSCULAR | Status: DC | PRN
  Administered 2018-01-28 (×2): 25 ug via INTRAVENOUS

## 2018-01-28 MED ORDER — MIDAZOLAM HCL 2 MG/2ML IJ SOLN
INTRAMUSCULAR | Status: DC | PRN
  Administered 2018-01-28 (×2): 1 mg via INTRAVENOUS

## 2018-01-28 MED ORDER — SODIUM CHLORIDE 0.9 % IV SOLN: 250.0000 mL | INTRAVENOUS | Status: DC | PRN

## 2018-01-28 SURGICAL SUPPLY — 11 items
CATH INFINITI 5FR ANG PIGTAIL (CATHETERS) ×3 IMPLANT
CATH INFINITI 5FR JL4 (CATHETERS) ×3 IMPLANT
CATH INFINITI JR4 5F (CATHETERS) ×3 IMPLANT
DEVICE CLOSURE MYNXGRIP 5F (Vascular Products) ×3 IMPLANT
GLIDESHEATH SLEND SS 6F .021 (SHEATH) IMPLANT
KIT MANI 3VAL PERCEP (MISCELLANEOUS) ×3 IMPLANT
NEEDLE PERC 18GX7CM (NEEDLE) ×3 IMPLANT
PACK CARDIAC CATH (CUSTOM PROCEDURE TRAY) ×3 IMPLANT
SHEATH AVANTI 5FR X 11CM (SHEATH) ×3 IMPLANT
WIRE GUIDERIGHT .035X150 (WIRE) ×3 IMPLANT
WIRE ROSEN-J .035X260CM (WIRE) IMPLANT

## 2018-01-28 NOTE — Plan of Care (Signed)
  Problem: Activity: Goal: Risk for activity intolerance will decrease Outcome: Progressing   Problem: Pain Managment: Goal: General experience of comfort will improve Outcome: Progressing   Problem: Safety: Goal: Ability to remain free from injury will improve Outcome: Progressing   Problem: Activity: Goal: Ability to tolerate increased activity will improve Outcome: Progressing   Problem: Cardiac: Goal: Ability to achieve and maintain adequate cardiovascular perfusion will improve Outcome: Progressing   

## 2018-01-28 NOTE — Discharge Summary (Signed)
[facility] - [place] [facility]   PATIENT NAME: Courtney Blanchard    MR#:  [id number]  DATE OF BIRTH:  1955-06-19  DATE OF ADMISSION:  01/26/2018 ADMITTING PHYSICIAN: Houston Siren, MD  DATE OF DISCHARGE: 11/18.2019  PRIMARY CARE PHYSICIAN: Jerl Mina, MD   ADMISSION DIAGNOSIS:  Atypical chest pain [R07.89] Coronary artery disease COPD Type 2 diabetes mellitus Hypertension DISCHARGE DIAGNOSIS:  Active Problems:   Chest pain   NSTEMI (non-ST elevated myocardial infarction) (HCC) Coronary artery disease Diabetes mellitus type 2  SECONDARY DIAGNOSIS:   Past Medical History:  Diagnosis Date  . COPD (chronic obstructive pulmonary disease) (HCC)   . Coronary artery disease   . Diabetes mellitus without complication (HCC)   . Hypertension      ADMITTING HISTORY Courtney Blanchard  is a 62 y.o. female with a known history of coronary artery disease, COPD, diabetes, hypertension who presents to the hospital due to chest pain.  Patient says she was in her usual state of health when this morning around 9 AM she developed chest pain/pressure which radiated to the neck into her left arm area.  She felt like somebody was sitting on her chest and had significant chest pressure.  She associated some nausea some shortness of breath with her symptoms.  The symptoms got progressively worse and so that she was concerned and came to the ER for further evaluation.  Patient was given some nitroglycerin and also aspirin by EMS and her chest pain/pressure has improved.  Her initial troponin was noted to be mildly elevated 0.04 and hospitalist services were contacted for admission.  Patient also admits to significant fatigue and weakness over the past few weeks.  She admits to this weakness even at rest and worse with minimal exertion.  Given her significant risk factors and mildly elevated troponin and ongoing symptoms hospitalist services were contacted for  admission.    HOSPITAL COURSE:  Vision admitted to telemetry.  Patient was started on aspirin, statin medication and was on anticoagulation with heparin drip.  Patient had elevated troponins was seen by cardiology.  Patient had cardiac cath and stents appeared patent.  Patient's chest pain is secondary to demand ischemia according to cardiology.  Cardiac cath revealed no significant disease in her LAD.  Stents in the proximal RCA showed no critical disease.  Patient will continue medical management with aspirin, statin and low-dose long-acting nitrates.  CONSULTS OBTAINED:  Treatment Team:  Dalia Heading, MD  DRUG ALLERGIES:   Allergies  Allergen Reactions  . Amoxicillin     Has patient had a PCN reaction causing immediate rash, facial/tongue/throat swelling, SOB or lightheadedness with hypotension: Yes Has patient had a PCN reaction causing severe rash involving mucus membranes or skin necrosis: No Has patient had a PCN reaction that required hospitalization: No Has patient had a PCN reaction occurring within the last 10 years: No If all of the above answers are "NO", then may proceed with Cephalosporin use.  Sore throat     DISCHARGE MEDICATIONS:   Allergies as of 01/28/2018      Reactions   Amoxicillin    Has patient had a PCN reaction causing immediate rash, facial/tongue/throat swelling, SOB or lightheadedness with hypotension: Yes Has patient had a PCN reaction causing severe rash involving mucus membranes or skin necrosis: No Has patient had a PCN reaction that required hospitalization: No Has patient had a PCN reaction occurring within the last 10 years: No If all of the above answers are "  NO", then may proceed with Cephalosporin use. Sore throat      Medication List    STOP taking these medications   doxycycline 100 MG capsule Commonly known as:  VIBRAMYCIN     TAKE these medications   acetaminophen 500 MG tablet Commonly known as:  TYLENOL Take 500 mg by  mouth every 6 (six) hours as needed.   ALPRAZolam 0.5 MG tablet Commonly known as:  XANAX Take 0.5 mg by mouth 2 (two) times daily as needed.   aspirin 81 MG tablet Take 81 mg by mouth daily.   atorvastatin 40 MG tablet Commonly known as:  LIPITOR Take 1 tablet (40 mg total) by mouth daily at 6 PM.   gabapentin 300 MG capsule Commonly known as:  NEURONTIN Take 600 mg by mouth at bedtime.   isosorbide mononitrate 30 MG 24 hr tablet Commonly known as:  IMDUR Take 1 tablet (30 mg total) by mouth daily. Start taking on:  01/29/2018   PARoxetine 20 MG tablet Commonly known as:  PAXIL Take 40 mg by mouth daily.       Today  Patient seen and evaluated today No chest pain No shortness of breath Tolerated procedure well  VITAL SIGNS:  Blood pressure 119/71, pulse (!) 54, temperature 98.7 F (37.1 C), temperature source Oral, resp. rate 18, height 5\' 2"  (1.575 m), weight 89.8 kg, SpO2 100 %.  I/O:    Intake/Output Summary (Last 24 hours) at 01/28/2018 1202 Last data filed at 01/28/2018 1008 Gross per 24 hour  Intake 230.17 ml  Output 800 ml  Net -569.83 ml    PHYSICAL EXAMINATION:  Physical Exam  GENERAL:  62 y.o.-year-old patient lying in the bed with no acute distress.  LUNGS: Normal breath sounds bilaterally, no wheezing, rales,rhonchi or crepitation. No use of accessory muscles of respiration.  CARDIOVASCULAR: S1, S2 normal. No murmurs, rubs, or gallops.  ABDOMEN: Soft, non-tender, non-distended. Bowel sounds present. No organomegaly or mass.  NEUROLOGIC: Moves all 4 extremities. PSYCHIATRIC: The patient is alert and oriented x 3.  SKIN: No obvious rash, lesion, or ulcer.   DATA REVIEW:   CBC Recent Labs  Lab 01/28/18 0503  WBC 5.3  HGB 12.4  HCT 38.9  PLT 199    Chemistries  Recent Labs  Lab 01/27/18 0519  NA 139  K 4.8  CL 104  CO2 28  GLUCOSE 103*  BUN 23*  CREATININE 0.84  CALCIUM 8.9    Cardiac Enzymes Recent Labs  Lab  01/26/18 2106  TROPONINI 1.09*    Microbiology Results  No results found for this or any previous visit.  RADIOLOGY:  No results found.  Follow up with PCP in 1 week.  Management plans discussed with the patient, family and they are in agreement.  CODE STATUS: Full code    Code Status Orders  (From admission, onward)         Start     Ordered   01/26/18 1320  Full code  Continuous     01/26/18 1320        Code Status History    Date Active Date Inactive Code Status Order ID Comments User Context   08/31/2016 2002 09/01/2016 2215 Full Code [id number]  Houston SirenSainani, Vivek J, MD Inpatient      TOTAL TIME TAKING CARE OF THIS PATIENT ON DAY OF DISCHARGE: more than 34 minutes.   Ihor AustinPavan Lucina Betty M.D on 01/28/2018 at 12:02 PM  Between 7am to 6pm - Pager - [phone]  After  6pm go to [email or web address] - password [email or web address]  [facility]  Office  [phone]  CC: Primary care physician; Jerl Mina, MD  Note: This dictation was prepared with Dragon dictation along with smaller phrase technology. Any transcriptional errors that result from this process are unintentional.

## 2018-01-28 NOTE — Progress Notes (Signed)
Courtney Blanchard to be D/C'd Home per MD order.  Discussed prescriptions and follow up appointments with the patient. Prescriptions were e-prescribed, medication list explained in detail. Pt verbalized understanding.  Allergies as of 01/28/2018      Reactions   Amoxicillin    Has patient had a PCN reaction causing immediate rash, facial/tongue/throat swelling, SOB or lightheadedness with hypotension: Yes Has patient had a PCN reaction causing severe rash involving mucus membranes or skin necrosis: No Has patient had a PCN reaction that required hospitalization: No Has patient had a PCN reaction occurring within the last 10 years: No If all of the above answers are "NO", then may proceed with Cephalosporin use. Sore throat      Medication List    STOP taking these medications   doxycycline 100 MG capsule Commonly known as:  VIBRAMYCIN     TAKE these medications   acetaminophen 500 MG tablet Commonly known as:  TYLENOL Take 500 mg by mouth every 6 (six) hours as needed.   ALPRAZolam 0.5 MG tablet Commonly known as:  XANAX Take 0.5 mg by mouth 2 (two) times daily as needed.   aspirin 81 MG tablet Take 81 mg by mouth daily.   atorvastatin 40 MG tablet Commonly known as:  LIPITOR Take 1 tablet (40 mg total) by mouth daily at 6 PM.   gabapentin 300 MG capsule Commonly known as:  NEURONTIN Take 600 mg by mouth at bedtime.   isosorbide mononitrate 30 MG 24 hr tablet Commonly known as:  IMDUR Take 1 tablet (30 mg total) by mouth daily. Start taking on:  01/29/2018   PARoxetine 20 MG tablet Commonly known as:  PAXIL Take 40 mg by mouth daily.       Vitals:   01/28/18 1000 01/28/18 1329  BP: 119/71 95/63  Pulse: (!) 54 61  Resp: 18 18  Temp: 98.7 F (37.1 C) 98.7 F (37.1 C)  SpO2: 100% 100%    Tele box removed and returned. Skin clean, dry and intact without evidence of skin break down, no evidence of skin tears noted. IV catheter discontinued intact. Site  without signs and symptoms of complications. Dressing and pressure applied. Pt denies pain at this time. No complaints noted.  An After Visit Summary was printed and given to the patient. Patient escorted via WC, and D/C home via private auto.  Rigoberto NoelErica Y Nakai Pollio

## 2018-01-28 NOTE — Progress Notes (Signed)
Patient Name: Lenore Cordia Date of Encounter: 01/28/2018  Hospital Problem List     Active Problems:   Chest pain   NSTEMI (non-ST elevated myocardial infarction) [facility])    Patient Profile     Patient with history of PCI of the RCA admitted with chest pain.  Troponin elevation.  Subjective   Further chest pain.  Cardiac cath revealed no significant progression of coronary disease.  Patent stents in her RCA.  No progression of disease in the left system.  Inpatient Medications    . [MAR Hold] aspirin  81 mg Oral Daily  . [START ON 01/29/2018] aspirin  81 mg Oral Pre-Cath  . [MAR Hold] atorvastatin  40 mg Oral q1800  . [MAR Hold] gabapentin  600 mg Oral QHS  . [MAR Hold] PARoxetine  40 mg Oral Daily  . sodium chloride flush  3 mL Intravenous Q12H  . [MAR Hold] sodium chloride flush  3 mL Intravenous Q12H    Vital Signs    Vitals:   01/28/18 0619 01/28/18 0722 01/28/18 0827 01/28/18 0830  BP: 106/66 (!) 109/49 (!) 112/55 103/60  Pulse: (!) 54 (!) 59 (!) 59 (!) 57  Resp:  13 (!) 9 15  Temp:  97.7 F (36.5 C)    TempSrc:      SpO2:  98% 98% 99%  Weight:  89.8 kg    Height:  5\' 2"  (1.575 m)      Intake/Output Summary (Last 24 hours) at 01/28/2018 0839 Last data filed at 01/28/2018 0631 Gross per 24 hour  Intake 230.17 ml  Output 600 ml  Net -369.83 ml   Filed Weights   01/27/18 0337 01/28/18 0433 01/28/18 0722  Weight: 89.5 kg 95.1 kg 89.8 kg    Physical Exam    GEN: Well nourished, well developed, in no acute distress.  HEENT: normal.  Neck: Supple, no JVD, carotid bruits, or masses. Cardiac: RRR, no murmurs, rubs, or gallops. No clubbing, cyanosis, edema.  Radials/DP/PT 2+ and equal bilaterally.  Respiratory:  Respirations regular and unlabored, clear to auscultation bilaterally. GI: Soft, nontender, nondistended, BS + x 4. MS: no deformity or atrophy. Skin: warm and dry, no rash. Neuro:  Strength and sensation are intact. Psych: Normal  affect.  Labs    CBC Recent Labs    01/27/18 0519 01/28/18 0503  WBC 4.4 5.3  HGB 12.7 12.4  HCT 39.7 38.9  MCV 94.7 95.6  PLT 213 199   Basic Metabolic Panel Recent Labs    16/10/96 0922 01/27/18 0519  NA 140 139  K 4.1 4.8  CL 104 104  CO2 28 28  GLUCOSE 99 103*  BUN 17 23*  CREATININE 0.68 0.84  CALCIUM 9.0 8.9   Liver Function Tests No results for input(s): AST, ALT, ALKPHOS, BILITOT, PROT, ALBUMIN in the last 72 hours. No results for input(s): LIPASE, AMYLASE in the last 72 hours. Cardiac Enzymes Recent Labs    01/26/18 1406 01/26/18 1716 01/26/18 2106  TROPONINI 0.43* 0.98* 1.09*   BNP No results for input(s): BNP in the last 72 hours. D-Dimer No results for input(s): DDIMER in the last 72 hours. Hemoglobin A1C No results for input(s): HGBA1C in the last 72 hours. Fasting Lipid Panel No results for input(s): CHOL, HDL, LDLCALC, TRIG, CHOLHDL, LDLDIRECT in the last 72 hours. Thyroid Function Tests No results for input(s): TSH, T4TOTAL, T3FREE, THYROIDAB in the last 72 hours.  Invalid input(s): FREET3  Telemetry    Sinus rhythm  ECG  Sinus rhythm with no ischemia  Radiology    Dg Chest 2 View  Result Date: 01/26/2018 CLINICAL DATA:  Chest pain and shortness of breath with radiation into the left shoulder EXAM: CHEST - 2 VIEW COMPARISON:  06/28/2017 FINDINGS: Cardiac shadow is stable. The lungs are well aerated bilaterally. No focal infiltrate or sizable effusion is seen. No acute bony abnormality is noted. IMPRESSION: No acute abnormality seen Electronically Signed   By: Alcide CleverMark  Lukens M.D.   On: 01/26/2018 09:23    Assessment & Plan    62 year old female with history of coronary disease status post PCI x2 in the RCA.  Stents appear patent.  Appears to have had demand ischemia.  Cardiac cath revealed no significant disease in her LAD.  Stents in the proximal RCA showed no critical disease.  Would continue with aspirin and atorvastatin 40 mg  daily.  Will discontinue heparin.  Will add low-dose long-acting nitrates.  Would ambulate and consider discharge if stable later today or in the morning.  Signed, Darlin PriestlyKenneth A. Tate Jerkins MD 01/28/2018, 8:39 AM  Pager: [phone]

## 2018-01-28 NOTE — Progress Notes (Signed)
ANTICOAGULATION CONSULT NOTE  Pharmacy Consult for heparin Indication: chest pain/ACS  Allergies  Allergen Reactions  . Amoxicillin     Has patient had a PCN reaction causing immediate rash, facial/tongue/throat swelling, SOB or lightheadedness with hypotension: Yes Has patient had a PCN reaction causing severe rash involving mucus membranes or skin necrosis: No Has patient had a PCN reaction that required hospitalization: No Has patient had a PCN reaction occurring within the last 10 years: No If all of the above answers are "NO", then may proceed with Cephalosporin use.  Sore throat     Patient Measurements: Height: 5\' 2"  (157.5 cm) Weight: 197 lb 4.8 oz (89.5 kg) IBW/kg (Calculated) : 50.1 Heparin Dosing Weight: 70.8 kg  Vital Signs: Temp: 98.1 F (36.7 C) (11/17 1953) Temp Source: Oral (11/17 1953) BP: 112/63 (11/17 1953) Pulse Rate: 63 (11/17 1953)  Labs: Recent Labs    01/26/18 0922 01/26/18 1406 01/26/18 1540 01/26/18 1716 01/26/18 2106  01/27/18 0519 01/27/18 1121 01/27/18 1811 01/28/18 0015  HGB 13.7  --   --   --   --   --  12.7  --   --   --   HCT 42.4  --   --   --   --   --  39.7  --   --   --   PLT 214  --   --   --   --   --  213  --   --   --   APTT  --   --  34  --   --   --   --   --   --   --   LABPROT  --   --  12.6  --   --   --   --   --   --   --   INR  --   --  0.95  --   --   --   --   --   --   --   HEPARINUNFRC  --   --   --   --   --    < > 0.39 0.27* 0.46 0.33  CREATININE 0.68  --   --   --   --   --  0.84  --   --   --   TROPONINI 0.04* 0.43*  --  0.98* 1.09*  --   --   --   --   --    < > = values in this interval not displayed.    Estimated Creatinine Clearance: 74.1 mL/min (by C-G formula based on SCr of 0.84 mg/dL).   Medical History: Past Medical History:  Diagnosis Date  . COPD (chronic obstructive pulmonary disease) (HCC)   . Coronary artery disease   . Diabetes mellitus without complication (HCC)   . Hypertension      Assessment: 62 year old female admitted with chest pain. Troponin elevated. No PTA anticoagulation in home med list. Pharmacy consulted for heparin drip.  11/17 @ 0600  HL 0.39. Level now therapeutic x1. Will continue heparin infusion @ 1000 units/hr. Recheck confirmatory HL in 6 hours.    11/17 @ 11:21 HL = 0.27.  Give bolus of 1000 units and increase drip rate to 1150 units/hr. Recheck HL in 6 hours tonight at 18:00.  11/17 @1811  HL 0.46. Level within goal. Will continue current rate. Recheck in 6 hours.   Goal of Therapy:  Heparin level 0.3-0.7 units/ml Monitor platelets by anticoagulation  protocol: Yes   Plan:  11/18  @ 0200 HL 0.33. Level now therapeutic x 2. Will continue current infusion rate of 1150 units/hr. Recheck next  HL with AM labs on 11/19.   CBC with morning labs per protocol.  Gardner CandleSheema M Mariateresa Batra, [facility]fieldRPH Clinical Pharmacist 01/28/2018 2:06 AM

## 2018-01-30 ENCOUNTER — Encounter: Payer: Self-pay | Admitting: *Deleted

## 2018-02-05 ENCOUNTER — Encounter: Admission: RE | Payer: Self-pay | Source: Ambulatory Visit

## 2018-02-05 ENCOUNTER — Ambulatory Visit: Admission: RE | Admit: 2018-02-05 | Payer: Medicare HMO | Source: Ambulatory Visit | Admitting: Ophthalmology

## 2018-02-05 HISTORY — DX: Tremor, unspecified: R25.1

## 2018-02-05 HISTORY — DX: Wheezing: R06.2

## 2018-02-05 HISTORY — DX: Chronic cough: R05.3

## 2018-02-05 HISTORY — DX: Major depressive disorder, single episode, unspecified: F32.9

## 2018-02-05 HISTORY — DX: Dyspnea, unspecified: R06.00

## 2018-02-05 HISTORY — DX: Cough: R05

## 2018-02-05 HISTORY — DX: Other complications of anesthesia, initial encounter: T88.59XA

## 2018-02-05 HISTORY — DX: Bronchitis, not specified as acute or chronic: J40

## 2018-02-05 HISTORY — DX: Depression, unspecified: F32.A

## 2018-02-05 HISTORY — DX: Cardiac arrhythmia, unspecified: I49.9

## 2018-02-05 HISTORY — DX: Adverse effect of unspecified anesthetic, initial encounter: T41.45XA

## 2018-02-05 HISTORY — DX: Localized edema: R60.0

## 2018-02-05 HISTORY — DX: Other allergic rhinitis: J30.89

## 2018-02-05 HISTORY — DX: Diverticulosis of intestine, part unspecified, without perforation or abscess without bleeding: K57.90

## 2018-02-05 HISTORY — DX: Anxiety disorder, unspecified: F41.9

## 2018-02-05 HISTORY — DX: Unspecified asthma, uncomplicated: J45.909

## 2018-02-05 SURGERY — PHACOEMULSIFICATION, CATARACT, WITH IOL INSERTION
Anesthesia: Choice | Laterality: Left

## 2018-02-11 DIAGNOSIS — I25110 Atherosclerotic heart disease of native coronary artery with unstable angina pectoris: Secondary | ICD-10-CM | POA: Diagnosis not present

## 2018-02-11 DIAGNOSIS — E785 Hyperlipidemia, unspecified: Secondary | ICD-10-CM | POA: Diagnosis not present

## 2018-03-11 DIAGNOSIS — Z0100 Encounter for examination of eyes and vision without abnormal findings: Secondary | ICD-10-CM | POA: Diagnosis not present

## 2018-05-13 DIAGNOSIS — R002 Palpitations: Secondary | ICD-10-CM | POA: Diagnosis not present

## 2018-05-13 DIAGNOSIS — R0602 Shortness of breath: Secondary | ICD-10-CM | POA: Diagnosis not present

## 2018-05-13 DIAGNOSIS — R5383 Other fatigue: Secondary | ICD-10-CM | POA: Diagnosis not present

## 2018-05-13 DIAGNOSIS — R05 Cough: Secondary | ICD-10-CM | POA: Diagnosis not present

## 2018-05-13 DIAGNOSIS — I2510 Atherosclerotic heart disease of native coronary artery without angina pectoris: Secondary | ICD-10-CM | POA: Diagnosis not present

## 2018-05-13 DIAGNOSIS — R600 Localized edema: Secondary | ICD-10-CM | POA: Diagnosis not present

## 2018-05-13 DIAGNOSIS — E785 Hyperlipidemia, unspecified: Secondary | ICD-10-CM | POA: Diagnosis not present

## 2018-07-03 DIAGNOSIS — R002 Palpitations: Secondary | ICD-10-CM | POA: Diagnosis not present

## 2018-07-04 DIAGNOSIS — R0602 Shortness of breath: Secondary | ICD-10-CM | POA: Diagnosis not present

## 2018-07-04 DIAGNOSIS — R062 Wheezing: Secondary | ICD-10-CM | POA: Diagnosis not present

## 2018-07-04 DIAGNOSIS — R002 Palpitations: Secondary | ICD-10-CM | POA: Diagnosis not present

## 2018-07-04 DIAGNOSIS — I2510 Atherosclerotic heart disease of native coronary artery without angina pectoris: Secondary | ICD-10-CM | POA: Diagnosis not present

## 2018-07-04 DIAGNOSIS — E785 Hyperlipidemia, unspecified: Secondary | ICD-10-CM | POA: Diagnosis not present

## 2018-08-22 DIAGNOSIS — M542 Cervicalgia: Secondary | ICD-10-CM | POA: Diagnosis not present

## 2018-08-22 DIAGNOSIS — E119 Type 2 diabetes mellitus without complications: Secondary | ICD-10-CM | POA: Diagnosis not present

## 2018-08-22 DIAGNOSIS — M62830 Muscle spasm of back: Secondary | ICD-10-CM | POA: Diagnosis not present

## 2018-12-18 DIAGNOSIS — R300 Dysuria: Secondary | ICD-10-CM | POA: Diagnosis not present

## 2018-12-23 DIAGNOSIS — F329 Major depressive disorder, single episode, unspecified: Secondary | ICD-10-CM | POA: Diagnosis not present

## 2018-12-23 DIAGNOSIS — M545 Low back pain: Secondary | ICD-10-CM | POA: Diagnosis not present

## 2018-12-23 DIAGNOSIS — M62838 Other muscle spasm: Secondary | ICD-10-CM | POA: Diagnosis not present

## 2018-12-23 DIAGNOSIS — M791 Myalgia, unspecified site: Secondary | ICD-10-CM | POA: Diagnosis not present

## 2018-12-23 DIAGNOSIS — M5441 Lumbago with sciatica, right side: Secondary | ICD-10-CM | POA: Diagnosis not present

## 2018-12-23 DIAGNOSIS — M5442 Lumbago with sciatica, left side: Secondary | ICD-10-CM | POA: Diagnosis not present

## 2018-12-23 DIAGNOSIS — M542 Cervicalgia: Secondary | ICD-10-CM | POA: Diagnosis not present

## 2019-01-22 DIAGNOSIS — E785 Hyperlipidemia, unspecified: Secondary | ICD-10-CM | POA: Diagnosis not present

## 2019-01-22 DIAGNOSIS — G609 Hereditary and idiopathic neuropathy, unspecified: Secondary | ICD-10-CM | POA: Diagnosis not present

## 2019-01-22 DIAGNOSIS — I25119 Atherosclerotic heart disease of native coronary artery with unspecified angina pectoris: Secondary | ICD-10-CM | POA: Diagnosis not present

## 2019-01-22 DIAGNOSIS — G894 Chronic pain syndrome: Secondary | ICD-10-CM | POA: Diagnosis not present

## 2019-01-22 DIAGNOSIS — Z636 Dependent relative needing care at home: Secondary | ICD-10-CM | POA: Diagnosis not present

## 2019-01-22 DIAGNOSIS — M791 Myalgia, unspecified site: Secondary | ICD-10-CM | POA: Diagnosis not present

## 2019-01-22 DIAGNOSIS — Z0000 Encounter for general adult medical examination without abnormal findings: Secondary | ICD-10-CM | POA: Diagnosis not present

## 2019-01-27 ENCOUNTER — Other Ambulatory Visit: Payer: Self-pay | Admitting: Family Medicine

## 2019-01-27 DIAGNOSIS — R922 Inconclusive mammogram: Secondary | ICD-10-CM

## 2019-02-13 ENCOUNTER — Encounter: Payer: Self-pay | Admitting: Cardiology

## 2019-02-13 ENCOUNTER — Inpatient Hospital Stay
Admission: EM | Admit: 2019-02-13 | Discharge: 2019-02-17 | DRG: 280 | Disposition: A | Discharge status: Home / Self Care | Payer: Medicare HMO | Attending: Internal Medicine | Admitting: Internal Medicine

## 2019-02-13 ENCOUNTER — Other Ambulatory Visit: Payer: Self-pay

## 2019-02-13 ENCOUNTER — Encounter
Admission: EM | Disposition: A | Discharge status: Home / Self Care | Payer: Self-pay | Source: Home / Self Care | Attending: Internal Medicine

## 2019-02-13 ENCOUNTER — Inpatient Hospital Stay
Admit: 2019-02-13 | Discharge: 2019-02-13 | Disposition: A | Discharge status: Home / Self Care | Payer: Medicare HMO | Attending: Internal Medicine | Admitting: Internal Medicine

## 2019-02-13 DIAGNOSIS — I2510 Atherosclerotic heart disease of native coronary artery without angina pectoris: Secondary | ICD-10-CM | POA: Diagnosis present

## 2019-02-13 DIAGNOSIS — E119 Type 2 diabetes mellitus without complications: Secondary | ICD-10-CM | POA: Diagnosis not present

## 2019-02-13 DIAGNOSIS — E785 Hyperlipidemia, unspecified: Secondary | ICD-10-CM | POA: Diagnosis not present

## 2019-02-13 DIAGNOSIS — Z880 Allergy status to penicillin: Secondary | ICD-10-CM | POA: Diagnosis not present

## 2019-02-13 DIAGNOSIS — F32A Depression, unspecified: Secondary | ICD-10-CM | POA: Diagnosis present

## 2019-02-13 DIAGNOSIS — I493 Ventricular premature depolarization: Secondary | ICD-10-CM | POA: Diagnosis present

## 2019-02-13 DIAGNOSIS — I252 Old myocardial infarction: Secondary | ICD-10-CM

## 2019-02-13 DIAGNOSIS — J449 Chronic obstructive pulmonary disease, unspecified: Secondary | ICD-10-CM | POA: Diagnosis not present

## 2019-02-13 DIAGNOSIS — Z7982 Long term (current) use of aspirin: Secondary | ICD-10-CM | POA: Diagnosis not present

## 2019-02-13 DIAGNOSIS — F329 Major depressive disorder, single episode, unspecified: Secondary | ICD-10-CM | POA: Diagnosis present

## 2019-02-13 DIAGNOSIS — Z8249 Family history of ischemic heart disease and other diseases of the circulatory system: Secondary | ICD-10-CM

## 2019-02-13 DIAGNOSIS — I10 Essential (primary) hypertension: Secondary | ICD-10-CM | POA: Diagnosis not present

## 2019-02-13 DIAGNOSIS — E669 Obesity, unspecified: Secondary | ICD-10-CM | POA: Diagnosis present

## 2019-02-13 DIAGNOSIS — I213 ST elevation (STEMI) myocardial infarction of unspecified site: Secondary | ICD-10-CM

## 2019-02-13 DIAGNOSIS — Z20828 Contact with and (suspected) exposure to other viral communicable diseases: Secondary | ICD-10-CM | POA: Diagnosis not present

## 2019-02-13 DIAGNOSIS — F419 Anxiety disorder, unspecified: Secondary | ICD-10-CM | POA: Diagnosis not present

## 2019-02-13 DIAGNOSIS — I2542 Coronary artery dissection: Secondary | ICD-10-CM | POA: Diagnosis present

## 2019-02-13 DIAGNOSIS — I2102 ST elevation (STEMI) myocardial infarction involving left anterior descending coronary artery: Secondary | ICD-10-CM | POA: Diagnosis not present

## 2019-02-13 DIAGNOSIS — I209 Angina pectoris, unspecified: Secondary | ICD-10-CM | POA: Diagnosis not present

## 2019-02-13 DIAGNOSIS — I2109 ST elevation (STEMI) myocardial infarction involving other coronary artery of anterior wall: Secondary | ICD-10-CM | POA: Diagnosis not present

## 2019-02-13 DIAGNOSIS — Z888 Allergy status to other drugs, medicaments and biological substances status: Secondary | ICD-10-CM

## 2019-02-13 DIAGNOSIS — F418 Other specified anxiety disorders: Secondary | ICD-10-CM | POA: Diagnosis present

## 2019-02-13 DIAGNOSIS — I25110 Atherosclerotic heart disease of native coronary artery with unstable angina pectoris: Secondary | ICD-10-CM | POA: Diagnosis not present

## 2019-02-13 DIAGNOSIS — R079 Chest pain, unspecified: Secondary | ICD-10-CM | POA: Diagnosis not present

## 2019-02-13 DIAGNOSIS — Z823 Family history of stroke: Secondary | ICD-10-CM | POA: Diagnosis not present

## 2019-02-13 DIAGNOSIS — R0789 Other chest pain: Secondary | ICD-10-CM | POA: Diagnosis present

## 2019-02-13 DIAGNOSIS — Z6837 Body mass index (BMI) 37.0-37.9, adult: Secondary | ICD-10-CM | POA: Diagnosis not present

## 2019-02-13 DIAGNOSIS — R0602 Shortness of breath: Secondary | ICD-10-CM | POA: Diagnosis not present

## 2019-02-13 DIAGNOSIS — K5790 Diverticulosis of intestine, part unspecified, without perforation or abscess without bleeding: Secondary | ICD-10-CM | POA: Diagnosis present

## 2019-02-13 DIAGNOSIS — I7100 Dissection of unspecified site of aorta: Secondary | ICD-10-CM

## 2019-02-13 HISTORY — DX: Coronary artery dissection: I25.42

## 2019-02-13 HISTORY — DX: ST elevation (STEMI) myocardial infarction involving other coronary artery of anterior wall: I21.09

## 2019-02-13 HISTORY — PX: LEFT HEART CATH AND CORONARY ANGIOGRAPHY: CATH118249

## 2019-02-13 HISTORY — DX: ST elevation (STEMI) myocardial infarction of unspecified site: I21.3

## 2019-02-13 HISTORY — DX: Dissection of unspecified site of aorta: I71.00

## 2019-02-13 LAB — COMPREHENSIVE METABOLIC PANEL
ALT: 20 U/L (ref 0–44)
AST: 25 U/L (ref 15–41)
Albumin: 3.7 g/dL (ref 3.5–5.0)
Alkaline Phosphatase: 86 U/L (ref 38–126)
Anion gap: 9 (ref 5–15)
BUN: 15 mg/dL (ref 8–23)
CO2: 24 mmol/L (ref 22–32)
Calcium: 8.8 mg/dL — ABNORMAL LOW (ref 8.9–10.3)
Chloride: 107 mmol/L (ref 98–111)
Creatinine, Ser: 0.64 mg/dL (ref 0.44–1.00)
GFR calc Af Amer: >60 mL/min (ref >60)
GFR calc non Af Amer: >60 mL/min (ref >60)
Glucose, Bld: 97 mg/dL (ref 70–99)
Potassium: 3.5 mmol/L (ref 3.5–5.1)
Sodium: 140 mmol/L (ref 135–145)
Total Bilirubin: 0.6 mg/dL (ref 0.3–1.2)
Total Protein: 6.5 g/dL (ref 6.5–8.1)

## 2019-02-13 LAB — CBC WITH DIFFERENTIAL/PLATELET
Abs Immature Granulocytes: 0 10*3/uL (ref 0.00–0.07)
Basophils Absolute: 0 10*3/uL (ref 0.0–0.1)
Basophils Relative: 0 %
Eosinophils Absolute: 0.1 10*3/uL (ref 0.0–0.5)
Eosinophils Relative: 1 %
HCT: 36.9 % (ref 36.0–46.0)
Hemoglobin: 11.9 g/dL — ABNORMAL LOW (ref 12.0–15.0)
Immature Granulocytes: 0 %
Lymphocytes Relative: 61 %
Lymphs Abs: 2.7 10*3/uL (ref 0.7–4.0)
MCH: 28.7 pg (ref 26.0–34.0)
MCHC: 32.2 g/dL (ref 30.0–36.0)
MCV: 89.1 fL (ref 80.0–100.0)
Monocytes Absolute: 0.4 10*3/uL (ref 0.1–1.0)
Monocytes Relative: 8 %
Neutro Abs: 1.4 10*3/uL — ABNORMAL LOW (ref 1.7–7.7)
Neutrophils Relative %: 30 %
Platelets: 199 10*3/uL (ref 150–400)
RBC: 4.14 MIL/uL (ref 3.87–5.11)
RDW: 12.9 % (ref 11.5–15.5)
WBC: 4.5 10*3/uL (ref 4.0–10.5)
nRBC: 0 % (ref 0.0–0.2)

## 2019-02-13 LAB — TROPONIN I (HIGH SENSITIVITY)
Troponin I (High Sensitivity): 469 ng/L (ref <18)
Troponin I (High Sensitivity): 6661 ng/L (ref <18)
Troponin I (High Sensitivity): 6518 ng/L (ref <18)
Troponin I (High Sensitivity): 5066 ng/L (ref <18)
Troponin I (High Sensitivity): 4692 ng/L (ref <18)

## 2019-02-13 LAB — PROTIME-INR
INR: 1 (ref 0.8–1.2)
Prothrombin Time: 12.6 seconds (ref 11.4–15.2)

## 2019-02-13 LAB — APTT: aPTT: 34 seconds (ref 24–36)

## 2019-02-13 LAB — LIPID PANEL
Cholesterol: 244 mg/dL — ABNORMAL HIGH (ref 0–200)
HDL: 50 mg/dL (ref >40)
LDL Cholesterol: 172 mg/dL — ABNORMAL HIGH (ref 0–99)
Total CHOL/HDL Ratio: 4.9 RATIO
Triglycerides: 112 mg/dL (ref <150)
VLDL: 22 mg/dL (ref 0–40)

## 2019-02-13 LAB — MRSA PCR SCREENING: MRSA by PCR: NEGATIVE

## 2019-02-13 LAB — SARS CORONAVIRUS 2 BY RT PCR (HOSPITAL ORDER, PERFORMED IN ~~LOC~~ HOSPITAL LAB): SARS Coronavirus 2: NEGATIVE

## 2019-02-13 LAB — GLUCOSE, CAPILLARY: Glucose-Capillary: 86 mg/dL (ref 70–99)

## 2019-02-13 LAB — POC SARS CORONAVIRUS 2 AG: SARS Coronavirus 2 Ag: NEGATIVE

## 2019-02-13 SURGERY — LEFT HEART CATH AND CORONARY ANGIOGRAPHY
Anesthesia: Moderate Sedation

## 2019-02-13 MED ORDER — MONTELUKAST SODIUM 10 MG PO TABS
10.0000 mg | ORAL_TABLET | Freq: Every day | ORAL | Status: DC
  Administered 2019-02-14 – 2019-02-17 (×4): 10 mg via ORAL
  Filled 2019-02-13 (×4): qty 1

## 2019-02-13 MED ORDER — NITROGLYCERIN 0.4 MG SL SUBL
0.4000 mg | SUBLINGUAL_TABLET | SUBLINGUAL | Status: DC | PRN
  Administered 2019-02-15 (×2): 0.4 mg via SUBLINGUAL
  Filled 2019-02-13: qty 1

## 2019-02-13 MED ORDER — HYDRALAZINE HCL 20 MG/ML IJ SOLN: 10.0000 mg | INTRAMUSCULAR | Status: DC | PRN

## 2019-02-13 MED ORDER — ACETAMINOPHEN 325 MG PO TABS
650.0000 mg | ORAL_TABLET | ORAL | Status: DC | PRN
  Administered 2019-02-13 – 2019-02-14 (×4): 650 mg via ORAL
  Filled 2019-02-13 (×5): qty 2

## 2019-02-13 MED ORDER — ONDANSETRON HCL 4 MG/2ML IJ SOLN
4.0000 mg | Freq: Four times a day (QID) | INTRAMUSCULAR | Status: DC | PRN
  Administered 2019-02-13 – 2019-02-15 (×2): 4 mg via INTRAVENOUS
  Filled 2019-02-13 (×2): qty 2

## 2019-02-13 MED ORDER — PAROXETINE HCL 20 MG PO TABS
40.0000 mg | ORAL_TABLET | Freq: Every day | ORAL | Status: DC
  Administered 2019-02-13 – 2019-02-16 (×3): 40 mg via ORAL
  Filled 2019-02-13 (×6): qty 2

## 2019-02-13 MED ORDER — ALBUTEROL SULFATE (2.5 MG/3ML) 0.083% IN NEBU: 2.5000 mg | INHALATION_SOLUTION | RESPIRATORY_TRACT | Status: DC | PRN

## 2019-02-13 MED ORDER — VERAPAMIL HCL 2.5 MG/ML IV SOLN
INTRAVENOUS | Status: AC
  Filled 2019-02-13: qty 2

## 2019-02-13 MED ORDER — SIMVASTATIN 20 MG PO TABS
40.0000 mg | ORAL_TABLET | Freq: Every day | ORAL | Status: DC
  Administered 2019-02-13 – 2019-02-16 (×4): 40 mg via ORAL
  Filled 2019-02-13 (×2): qty 2
  Filled 2019-02-13: qty 1
  Filled 2019-02-13: qty 2
  Filled 2019-02-13: qty 1
  Filled 2019-02-13: qty 2

## 2019-02-13 MED ORDER — GABAPENTIN 300 MG PO CAPS
600.0000 mg | ORAL_CAPSULE | Freq: Every day | ORAL | Status: DC
  Administered 2019-02-13 – 2019-02-16 (×4): 600 mg via ORAL
  Filled 2019-02-13 (×4): qty 2

## 2019-02-13 MED ORDER — HYDRALAZINE HCL 20 MG/ML IJ SOLN: 5.0000 mg | INTRAMUSCULAR | Status: AC | PRN

## 2019-02-13 MED ORDER — ASPIRIN EC 81 MG PO TBEC
81.0000 mg | DELAYED_RELEASE_TABLET | Freq: Every day | ORAL | Status: DC
  Administered 2019-02-14 – 2019-02-17 (×4): 81 mg via ORAL
  Filled 2019-02-13 (×5): qty 1

## 2019-02-13 MED ORDER — SODIUM CHLORIDE 0.9 % WEIGHT BASED INFUSION
1.0000 mL/kg/h | INTRAVENOUS | Status: AC
  Administered 2019-02-13 (×2): 1 mL/kg/h via INTRAVENOUS

## 2019-02-13 MED ORDER — SODIUM CHLORIDE 0.9 % IV SOLN
INTRAVENOUS | Status: DC
  Administered 2019-02-13: 04:00:00 via INTRAVENOUS

## 2019-02-13 MED ORDER — HEPARIN (PORCINE) IN NACL 1000-0.9 UT/500ML-% IV SOLN
INTRAVENOUS | Status: AC
  Filled 2019-02-13: qty 1000

## 2019-02-13 MED ORDER — MORPHINE SULFATE (PF) 2 MG/ML IV SOLN
2.0000 mg | INTRAVENOUS | Status: DC | PRN
  Administered 2019-02-13 – 2019-02-15 (×4): 2 mg via INTRAVENOUS
  Filled 2019-02-13 (×5): qty 1

## 2019-02-13 MED ORDER — GUAIFENESIN ER 600 MG PO TB12
600.0000 mg | ORAL_TABLET | Freq: Two times a day (BID) | ORAL | Status: DC
  Administered 2019-02-13 – 2019-02-17 (×9): 600 mg via ORAL
  Filled 2019-02-13 (×9): qty 1

## 2019-02-13 MED ORDER — LABETALOL HCL 5 MG/ML IV SOLN: 10.0000 mg | INTRAVENOUS | Status: DC | PRN

## 2019-02-13 MED ORDER — BIVALIRUDIN TRIFLUOROACETATE 250 MG IV SOLR
INTRAVENOUS | Status: AC
  Filled 2019-02-13: qty 250

## 2019-02-13 MED ORDER — HEPARIN (PORCINE) IN NACL 1000-0.9 UT/500ML-% IV SOLN
INTRAVENOUS | Status: DC | PRN
  Administered 2019-02-13: 1000 mL

## 2019-02-13 MED ORDER — DM-GUAIFENESIN ER 30-600 MG PO TB12: 1.0000 | ORAL_TABLET | Freq: Two times a day (BID) | ORAL | Status: DC

## 2019-02-13 MED ORDER — CLOPIDOGREL BISULFATE 75 MG PO TABS
ORAL_TABLET | ORAL | Status: DC | PRN
  Administered 2019-02-13: 75 mg via ORAL

## 2019-02-13 MED ORDER — FENTANYL CITRATE (PF) 100 MCG/2ML IJ SOLN
INTRAMUSCULAR | Status: AC
  Filled 2019-02-13: qty 2

## 2019-02-13 MED ORDER — ASPIRIN 81 MG PO CHEW
CHEWABLE_TABLET | ORAL | Status: AC
  Filled 2019-02-13: qty 4

## 2019-02-13 MED ORDER — DEXTROMETHORPHAN POLISTIREX ER 30 MG/5ML PO SUER
30.0000 mg | Freq: Two times a day (BID) | ORAL | Status: DC
  Administered 2019-02-13 – 2019-02-16 (×6): 30 mg via ORAL
  Filled 2019-02-13 (×7): qty 5

## 2019-02-13 MED ORDER — HEART ATTACK BOUNCING BOOK
Freq: Once | Status: AC
  Administered 2019-02-13: 18:00:00

## 2019-02-13 MED ORDER — MIDAZOLAM HCL 2 MG/2ML IJ SOLN
INTRAMUSCULAR | Status: DC | PRN
  Administered 2019-02-13 (×2): 1 mg via INTRAVENOUS

## 2019-02-13 MED ORDER — CHLORHEXIDINE GLUCONATE CLOTH 2 % EX PADS
6.0000 | MEDICATED_PAD | Freq: Every day | CUTANEOUS | Status: DC
  Administered 2019-02-13: 6 via TOPICAL

## 2019-02-13 MED ORDER — SODIUM CHLORIDE 0.9% FLUSH
3.0000 mL | Freq: Two times a day (BID) | INTRAVENOUS | Status: DC
  Administered 2019-02-13 – 2019-02-16 (×8): 3 mL via INTRAVENOUS

## 2019-02-13 MED ORDER — MIDAZOLAM HCL 2 MG/2ML IJ SOLN
INTRAMUSCULAR | Status: AC
  Filled 2019-02-13: qty 2

## 2019-02-13 MED ORDER — HEPARIN SODIUM (PORCINE) 1000 UNIT/ML IJ SOLN
INTRAMUSCULAR | Status: DC | PRN
  Administered 2019-02-13: 5000 [IU] via INTRAVENOUS

## 2019-02-13 MED ORDER — VERAPAMIL HCL 2.5 MG/ML IV SOLN
INTRAVENOUS | Status: DC | PRN
  Administered 2019-02-13: 10 mL via INTRA_ARTERIAL

## 2019-02-13 MED ORDER — ALPRAZOLAM 0.5 MG PO TABS
0.5000 mg | ORAL_TABLET | Freq: Two times a day (BID) | ORAL | Status: DC | PRN
  Administered 2019-02-13 – 2019-02-16 (×7): 0.5 mg via ORAL
  Filled 2019-02-13 (×7): qty 1

## 2019-02-13 MED ORDER — HEPARIN SODIUM (PORCINE) 5000 UNIT/ML IJ SOLN
5000.0000 [IU] | Freq: Three times a day (TID) | INTRAMUSCULAR | Status: DC
  Administered 2019-02-13 – 2019-02-15 (×7): 5000 [IU] via SUBCUTANEOUS
  Filled 2019-02-13 (×7): qty 1

## 2019-02-13 MED ORDER — CLOPIDOGREL BISULFATE 75 MG PO TABS
ORAL_TABLET | ORAL | Status: AC
  Filled 2019-02-13: qty 1

## 2019-02-13 MED ORDER — SODIUM CHLORIDE 0.9% FLUSH: 3.0000 mL | INTRAVENOUS | Status: DC | PRN

## 2019-02-13 MED ORDER — ASPIRIN 81 MG PO CHEW
CHEWABLE_TABLET | ORAL | Status: AC
  Filled 2019-02-13: qty 1

## 2019-02-13 MED ORDER — ASPIRIN 81 MG PO CHEW
CHEWABLE_TABLET | ORAL | Status: DC | PRN
  Administered 2019-02-13: 324 mg via ORAL

## 2019-02-13 MED ORDER — SODIUM CHLORIDE 0.9 % IV SOLN: 250.0000 mL | INTRAVENOUS | Status: DC | PRN

## 2019-02-13 MED ORDER — IOHEXOL 300 MG/ML  SOLN
INTRAMUSCULAR | Status: DC | PRN
  Administered 2019-02-13: 190 mL

## 2019-02-13 MED ORDER — METOPROLOL SUCCINATE ER 25 MG PO TB24
25.0000 mg | ORAL_TABLET | Freq: Every day | ORAL | Status: DC
  Administered 2019-02-13 – 2019-02-14 (×2): 25 mg via ORAL
  Filled 2019-02-13 (×2): qty 1

## 2019-02-13 MED ORDER — HEPARIN SODIUM (PORCINE) 1000 UNIT/ML IJ SOLN
INTRAMUSCULAR | Status: AC
  Filled 2019-02-13: qty 1

## 2019-02-13 MED ORDER — FENTANYL CITRATE (PF) 100 MCG/2ML IJ SOLN
INTRAMUSCULAR | Status: DC | PRN
  Administered 2019-02-13 (×2): 25 ug via INTRAVENOUS

## 2019-02-13 MED ORDER — CLOPIDOGREL BISULFATE 75 MG PO TABS
81.0000 mg | ORAL_TABLET | Freq: Every day | ORAL | Status: DC
  Administered 2019-02-13 – 2019-02-17 (×5): 75 mg via ORAL
  Filled 2019-02-13 (×5): qty 1

## 2019-02-13 MED ORDER — ISOSORBIDE MONONITRATE ER 30 MG PO TB24
30.0000 mg | ORAL_TABLET | Freq: Every day | ORAL | Status: DC
  Administered 2019-02-13 – 2019-02-15 (×3): 30 mg via ORAL
  Filled 2019-02-13 (×3): qty 1

## 2019-02-13 SURGICAL SUPPLY — 19 items
CATH GUIDE ADROIT 6FR AL.75 (CATHETERS) ×3 IMPLANT
CATH INFINITI 5 FR 3DRC (CATHETERS) ×3 IMPLANT
CATH INFINITI 5 FR JL3.5 (CATHETERS) ×3 IMPLANT
CATH INFINITI 5FR ANG PIGTAIL (CATHETERS) ×3 IMPLANT
CATH INFINITI 5FR JL4 (CATHETERS) ×3 IMPLANT
CATH INFINITI JR4 5F (CATHETERS) ×3 IMPLANT
CATH LAUNCHER 5F EBU3.0 (CATHETERS) ×1 IMPLANT
CATH LAUNCHER 5F EBU3.5 (CATHETERS) ×3 IMPLANT
CATHETER LAUNCHER 5F EBU3.0 (CATHETERS) ×3
DEVICE CLOSURE MYNXGRIP 6/7F (Vascular Products) ×3 IMPLANT
DEVICE INFLAT 30 PLUS (MISCELLANEOUS) ×3 IMPLANT
DEVICE RAD TR BAND REGULAR (VASCULAR PRODUCTS) ×3 IMPLANT
GLIDESHEATH SLEND SS 6F .021 (SHEATH) ×3 IMPLANT
KIT MANI 3VAL PERCEP (MISCELLANEOUS) ×3 IMPLANT
PACK CARDIAC CATH (CUSTOM PROCEDURE TRAY) ×3 IMPLANT
SHEATH AVANTI 6FR X 11CM (SHEATH) ×3 IMPLANT
WIRE GUIDERIGHT .035X150 (WIRE) ×3 IMPLANT
WIRE HITORQ VERSACORE ST 145CM (WIRE) ×3 IMPLANT
WIRE ROSEN-J .035X260CM (WIRE) ×3 IMPLANT

## 2019-02-13 NOTE — Progress Notes (Signed)
Ch assisted pt sister to get an update on the current progress of pt. Pt is a 63 y.o. female that experienced a heart attack early morning of 12.3.20. Pt has a hx of heart issues and reported that she was not surprised that her heart is torn. 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 and ETOH dependent), and grandchildren. Pt is aware that she needs to take better care of herself. Ch provided words of encouragement and a compassionate presence with pt and family. Ch visit was appreciated.  F/u encouraged.    02/13/19 1300  Clinical Encounter Type  Visited With Patient and family together;Health care provider  Visit Type Follow-up;Spiritual support;Social support  Referral From Chaplain  Consult/Referral To Chaplain  Spiritual Encounters  Spiritual Needs Prayer;Emotional;Grief support  Stress Factors  Patient Stress Factors Exhausted;Family relationships;Health changes;Loss of control;Major life changes  Family Stress Factors Family relationships;Health changes;Lack of caregivers;Major life changes

## 2019-02-13 NOTE — Progress Notes (Signed)
Dr Josefa Half in patient room. Notified of patients troponin level of 6,661. At this time no additional orders given.

## 2019-02-13 NOTE — Progress Notes (Signed)
*  PRELIMINARY RESULTS* Echocardiogram 2D Echocardiogram has been performed.  Courtney Blanchard 02/13/2019, 10:29 AM

## 2019-02-13 NOTE — Progress Notes (Signed)
Dr Josefa Half in earlier to see patient. Patient states that she has intermit chest discomfort. MD made aware by patient. At this time she states she has no pain or discomfort and no pain meds needed. Continue to assess.

## 2019-02-13 NOTE — ED Provider Notes (Signed)
[facility] Emergency Department Provider Note   ____________________________________________   I have reviewed the triage vital signs and the nursing notes.   HISTORY  Chief Complaint Code STEMI   History limited by: Not Limited   HPI Courtney Blanchard is a 63 y.o. female who presents to the emergency department today via EMS as code STEMI. Patient states that she started having chest pain yesterday morning. The pain would come and go throughout the day. The patient did try taking nitroglycerin which would help with the pain. The chest pain was associated with shortness of breath. She does have history of MIs in the past and states that this pain reminded her of the pain she has had in the past. The patient denies any recent fevers.   Records reviewed. Per medical record review patient has a history of CAD.   Past Medical History:  Diagnosis Date  . Anxiety   . Asthma   . Bronchitis   . Chronic cough   . Complication of anesthesia    nausea and vomiting  . COPD (chronic obstructive pulmonary disease) ([place])   . Coronary artery disease   . Depression   . Diabetes mellitus without complication ([place])   . Diverticulosis   . Dyspnea   . Dysrhythmia   . Environmental and seasonal allergies   . Hypertension   . Lower extremity edema   . Tremors of nervous system   . Wheezing     Patient Active Problem List   Diagnosis Date Noted  . NSTEMI (non-ST elevated myocardial infarction) ([place]) 01/27/2018  . Chest pain 08/31/2016    Past Surgical History:  Procedure Laterality Date  . ABDOMINAL HYSTERECTOMY    . BARIATRIC SURGERY    . CARDIAC CATHETERIZATION     with stent  . CHOLECYSTECTOMY    . COLON SURGERY    . LEFT HEART CATH AND CORONARY ANGIOGRAPHY N/A 01/28/2018   Procedure: LEFT HEART CATH AND CORONARY ANGIOGRAPHY;  Surgeon: Teodoro Spray, MD;  Location: [place] [facility];  Service: Cardiovascular;  Laterality: N/A;    Prior to  Admission medications   Medication Sig Start Date End Date Taking? Authorizing Provider  acetaminophen (TYLENOL) 500 MG tablet Take 500-1,000 mg by mouth 2 (two) times daily as needed for moderate pain or headache.     [provider]  ALPRAZolam Duanne Moron) 0.5 MG tablet Take 0.5 mg by mouth 2 (two) times daily as needed for anxiety.  07/04/16   [provider]  aspirin 81 MG tablet Take 81 mg by mouth daily.     [provider]  atorvastatin (LIPITOR) 40 MG tablet Take 1 tablet (40 mg total) by mouth daily at 6 PM. Patient not taking: Reported on 02/01/2018 01/28/18 02/27/18  Saundra Shelling, MD  doxycycline (VIBRAMYCIN) 100 MG capsule Take 100 mg by mouth daily.     [provider]  gabapentin (NEURONTIN) 300 MG capsule Take 600 mg by mouth at bedtime.     [provider]  isosorbide mononitrate (IMDUR) 30 MG 24 hr tablet Take 1 tablet (30 mg total) by mouth daily. 01/29/18 02/28/18  Saundra Shelling, MD  metoprolol succinate (TOPROL-XL) 25 MG 24 hr tablet Take 12.5 mg by mouth daily.     [provider]  montelukast (SINGULAIR) 10 MG tablet Take 10 mg by mouth daily.     [provider]  PARoxetine (PAXIL) 20 MG tablet Take 40 mg by mouth at bedtime.  02/28/16   [provider]    Allergies Amoxicillin and Atorvastatin  Family History  Problem Relation Age of Onset  . Hypertension Mother   . Heart disease Father   . CAD Father   . Heart disease Sister   . Stroke Sister     Social History Social History   Tobacco Use  . Smoking status: Never Smoker  . Smokeless tobacco: Never Used  Substance Use Topics  . Alcohol use: No  . Drug use: No    Review of Systems Constitutional: No fever/chills Eyes: No visual changes. ENT: No sore throat. Cardiovascular: Positive for chest pain. Respiratory: Positive for shortness of breath. Gastrointestinal: No abdominal pain.  No nausea, no vomiting.  No diarrhea.    Genitourinary: Negative for dysuria. Musculoskeletal: Negative for back pain. Skin: Negative for rash. Neurological: Negative for headaches, focal weakness or numbness.  ____________________________________________   PHYSICAL EXAM:  VITAL SIGNS: ED Triage Vitals  Enc Vitals Group     BP 02/13/19 0414 138/89     Pulse Rate 02/13/19 0410 69     Resp 02/13/19 0410 19     Temp 02/13/19 0415 97.6 F (36.4 C)     Temp Source 02/13/19 0415 Oral     SpO2 02/13/19 0414 100 %     Weight 02/13/19 0413 185 lb (83.9 kg)     Height 02/13/19 0413 5\' 2"  (1.575 m)     Head Circumference --      Peak Flow --      Pain Score 02/13/19 0412 5   Constitutional: Alert and oriented.  Eyes: Conjunctivae are normal.  ENT      Head: Normocephalic and atraumatic.      Nose: No congestion/rhinnorhea.      Mouth/Throat: Mucous membranes are moist.      Neck: No stridor. Hematological/Lymphatic/Immunilogical: No cervical lymphadenopathy. Cardiovascular: Normal rate, regular rhythm.  No murmurs, rubs, or gallops. Respiratory: Normal respiratory effort without tachypnea nor retractions. Breath sounds are clear and equal bilaterally. No wheezes/rales/rhonchi. Gastrointestinal: Soft and non tender. No rebound. No guarding.  Genitourinary: Deferred Musculoskeletal: Normal range of motion in all extremities.  Neurologic:  Normal speech and language. No gross focal neurologic deficits are appreciated.  Skin:  Skin is warm, dry and intact. No rash noted. Psychiatric: Mood and affect are normal. Speech and behavior are normal. Patient exhibits appropriate insight and judgment.  ____________________________________________    LABS (pertinent positives/negatives)  CBC wbc 4.5, hgb 11.9, plt 199  ____________________________________________   EKG  I, 14/03/20, attending physician, personally viewed and interpreted this EKG  EKG Time: 0412 Rate: 83 Rhythm: sinus rhythm with multifocal  pvcs Axis: normal Intervals: qtc 461 QRS: narrow, low voltage precordial leads ST changes: no st elevation Impression: abnormal ekg  I, Phineas Semen, attending physician, personally viewed and interpreted this EKG  EKG Time: 0418 Rate: 67 Rhythm: sinus rhythm Axis: normal Intervals: qtc 439 QRS: narrow, low voltage precordial leads ST changes: no st elevation Impression: abnormal ekg  ____________________________________________    RADIOLOGY  None  ____________________________________________   PROCEDURES  Procedures  CRITICAL CARE Performed by: Phineas Semen   Total critical care time: 20 minutes  Critical care time was exclusive of separately billable procedures and treating other patients.  Critical care was necessary to treat or prevent imminent or life-threatening deterioration.  Critical care was time spent personally by me on the following activities: development of treatment plan with patient and/or surrogate as well as nursing, discussions with consultants, evaluation of patient's response  to treatment, examination of patient, obtaining history from patient or surrogate, ordering and performing treatments and interventions, ordering and review of laboratory studies, ordering and review of radiographic studies, pulse oximetry and re-evaluation of patient's condition.  ____________________________________________   INITIAL IMPRESSION / ASSESSMENT AND PLAN / ED COURSE  Pertinent labs & imaging results that were available during my care of the patient were reviewed by me and considered in my medical decision making (see chart for details).   Patient presented to the emergency department today via EMS as a code STEMI.  The patient does have significant cardiac history.  I did review 12-lead from EMS and did have concerns for ST elevation however EKG here without clear ST elevation.  Dr. Darrold JunkerParaschos with cardiology had been paged out for code STEMI.  After  evaluating the patient she will be taken for emergent catheterization.  ___________________________________________   FINAL CLINICAL IMPRESSION(S) / ED DIAGNOSES  Final diagnoses:  Chest pain of uncertain etiology     Note: This dictation was prepared with Dragon dictation. Any transcriptional errors that result from this process are unintentional     Phineas SemenGoodman, Keilani Terrance, MD 02/13/19 0502

## 2019-02-13 NOTE — Progress Notes (Signed)
0017 received pt from cath lab s/p  One-vessel coronary artery disease with spontaneous coronary dissection mid to distal LAD 2.  Patent stents mid proximal and ostial RCA 3.  Mild reduced left ventricular function with apical dyskinesis. Pt is a/ox4, denies any CP or any other pain. Right groin access line at level zero. Rt wrist  TR band with 12cc as reported. Pt will be on bedrest till 0745am. Pt oriented to room and call light and instructed to keep right leg straight, pt stated understanding. Changed NS to 92.15ml/hr as ordered. PCR covid and PCR /MRSA Pt on protocol isolation pending result. VSS.

## 2019-02-13 NOTE — ED Notes (Signed)
Pt being transported to cath lab at this time  

## 2019-02-13 NOTE — Progress Notes (Signed)
Assisted tele visit to patient with sister.  Sumeet Geter M Abdullahi Vallone, RN   

## 2019-02-13 NOTE — H&P (Signed)
History and Physical    ELIM PEALE WUJ:[id number] DOB: [birth year] DOA: 02/13/2019  Referring MD/NP/PA:   PCP: Jerl Mina, MD   Patient coming from:  The patient is coming from home.  At baseline, pt is independent for most of ADL.        Chief Complaint: chest pain  HPI: NIYANNA Blanchard is a 63 y.o. female with medical history significant of CAD, status post multiple stents in the distal mid and proximal RCA, hypertension, hyperlipidemia, diet-controlled diabetes, COPD, asthma, depression with anxiety, tremor, who presents with chest pain  Pt was in usual state of health until 02/12/2019 when she experienced intermittent episodes of substernal chest pain. Pt was brought to ED via EMS.  ECG in route revealed ST elevations in the anterior leads. At arrival to Emergency room, her chest pain improved with apparent resolution of ST elevation. Pt underwent urgent cardiac catheterization in the early morning. Currently, she states that she has mild substernal chest pain and mild shortness of breath.  She has intermittent mild dry cough.  No fever or chills.  Denies nausea vomiting, diarrhea, abdominal pain, symptoms of UTI or unilateral weakness.  Cardiac cath by Dr. Darrold Junker 02/13/19:  Previously placed Mid RCA stent (unknown type) is widely patent.  Previously placed Prox RCA stent (unknown type) is widely patent.  Previously placed Ost RCA to Prox RCA stent (unknown type) is widely patent.  Mid LAD to Dist LAD lesion is 70% stenosed.   1.  One-vessel coronary artery disease with spontaneous coronary dissection mid to distal LAD 2.  Patent stents mid proximal and ostial RCA 3.  Mild reduced left ventricular function with apical dyskinesis  Recommendations 1.  Medical therapy 2.  Dual antiplatelet therapy 3.  2D echocardiogram 4.  Resume metoprolol succinate and isosorbide mononitrate 5.  Resume simvastati   ED Course: pt was found to have troponin 469 -->6661, INR 1.0,  pending COVID-19 test (Covid antigen test negative), WBC 4.5, electrolytes renal function okay, temperature normal, blood pressure 129/82, heart rate 60, oxygen saturation 98% on room air.  Patient is admitted to stepdown as inpatient.  Review of Systems:   General: no fevers, chills, no body weight gain,  has fatigue HEENT: no blurry vision, hearing changes or sore throat Respiratory: has dyspnea, coughing, no wheezing CV: has chest pain, no palpitations GI: no nausea, vomiting, abdominal pain, diarrhea, constipation GU: no dysuria, burning on urination, increased urinary frequency, hematuria  Ext: no leg edema Neuro: no unilateral weakness, numbness, or tingling, no vision change or hearing loss Skin: no rash, no skin tear. MSK: No muscle spasm, no deformity, no limitation of range of movement in spin Heme: No easy bruising.  Travel history: No recent long distant travel.  Allergy:  Allergies  Allergen Reactions  . Amoxicillin     Has patient had a PCN reaction causing immediate rash, facial/tongue/throat swelling, SOB or lightheadedness with hypotension: Yes Has patient had a PCN reaction causing severe rash involving mucus membranes or skin necrosis: No Has patient had a PCN reaction that required hospitalization: No Has patient had a PCN reaction occurring within the last 10 years: No If all of the above answers are "NO", then may proceed with Cephalosporin use.  Sore throat   . Atorvastatin     Tremors     Past Medical History:  Diagnosis Date  . Anxiety   . Asthma   . Bronchitis   . Chronic cough   . Complication of anesthesia  nausea and vomiting  . COPD (chronic obstructive pulmonary disease) (HCC)   . Coronary artery disease   . Depression   . Diabetes mellitus without complication (HCC)   . Diverticulosis   . Dyspnea   . Dysrhythmia   . Environmental and seasonal allergies   . Hypertension   . Lower extremity edema   . Tremors of nervous system   .  Wheezing     Past Surgical History:  Procedure Laterality Date  . ABDOMINAL HYSTERECTOMY    . BARIATRIC SURGERY    . CARDIAC CATHETERIZATION     with stent  . CHOLECYSTECTOMY    . COLON SURGERY    . LEFT HEART CATH AND CORONARY ANGIOGRAPHY N/A 01/28/2018   Procedure: LEFT HEART CATH AND CORONARY ANGIOGRAPHY;  Surgeon: Dalia Heading, MD;  Location: [facility] INVASIVE CV LAB;  Service: Cardiovascular;  Laterality: N/A;  . LEFT HEART CATH AND CORONARY ANGIOGRAPHY N/A 02/13/2019   Procedure: LEFT HEART CATH AND CORONARY ANGIOGRAPHY;  Surgeon: Marcina Millard, MD;  Location: [facility] INVASIVE CV LAB;  Service: Cardiovascular;  Laterality: N/A;    Social History:  reports that she has never smoked. She has never used smokeless tobacco. She reports that she does not drink alcohol or use drugs.  Family History:  Family History  Problem Relation Age of Onset  . Hypertension Mother   . Heart disease Father   . CAD Father   . Heart disease Sister   . Stroke Sister      Prior to Admission medications   Medication Sig Start Date End Date Taking? Authorizing Provider  acetaminophen (TYLENOL) 500 MG tablet Take 500-1,000 mg by mouth 2 (two) times daily as needed for moderate pain or headache.     [provider]  ALPRAZolam Prudy Feeler) 0.5 MG tablet Take 0.5 mg by mouth 2 (two) times daily as needed for anxiety.  07/04/16   [provider]  aspirin 81 MG tablet Take 81 mg by mouth daily.     [provider]  atorvastatin (LIPITOR) 40 MG tablet Take 1 tablet (40 mg total) by mouth daily at 6 PM. Patient not taking: Reported on 02/01/2018 01/28/18 02/27/18  Ihor Austin, MD  doxycycline (VIBRAMYCIN) 100 MG capsule Take 100 mg by mouth daily.     [provider]  gabapentin (NEURONTIN) 300 MG capsule Take 600 mg by mouth at bedtime.     [provider]  isosorbide mononitrate (IMDUR) 30 MG 24 hr tablet Take 1 tablet (30 mg total) by mouth daily. 01/29/18  02/28/18  Ihor Austin, MD  metoprolol succinate (TOPROL-XL) 25 MG 24 hr tablet Take 12.5 mg by mouth daily.     [provider]  montelukast (SINGULAIR) 10 MG tablet Take 10 mg by mouth daily.     [provider]  PARoxetine (PAXIL) 20 MG tablet Take 40 mg by mouth at bedtime.  02/28/16   [provider]    Physical Exam: Vitals:   02/13/19 0426 02/13/19 0600 02/13/19 0700 02/13/19 0909  BP:  135/81 129/82 (!) 135/95  Pulse:   60 62  Resp:   18 (!) 21  Temp:  98.2 F (36.8 C)  97.7 F (36.5 C)  TempSrc:  Axillary    SpO2:  100% 98% 97%  Weight: 92.1 kg 94.3 kg    Height:  (1.575 m)  (1.575 m)     General: Not in acute distress HEENT:       Eyes: PERRL, EOMI, no  scleral icterus.       ENT: No discharge from the ears and nose, no pharynx injection, no tonsillar enlargement.        Neck: No JVD, no bruit, no mass felt. Heme: No neck lymph node enlargement. Cardiac: S1/S2, RRR, No murmurs, No gallops or rubs. Respiratory: No rales, wheezing, rhonchi or rubs. GI: Soft, nondistended, nontender, no rebound pain, no organomegaly, BS present. GU: No hematuria Ext: No pitting leg edema bilaterally. 2+DP/PT pulse bilaterally. Musculoskeletal: No joint deformities, No joint redness or warmth, no limitation of ROM in spin. Skin: No rashes.  Neuro: Alert, oriented X3, cranial nerves II-XII grossly intact, moves all extremities normally. Psych: Patient is not psychotic, no suicidal or hemocidal ideation.  Labs on Admission: I have personally reviewed following labs and imaging studies  CBC: Recent Labs  Lab 02/13/19 0415  WBC 4.5  NEUTROABS 1.4*  HGB 11.9*  HCT 36.9  MCV 89.1  PLT 199   Basic Metabolic Panel: Recent Labs  Lab 02/13/19 0415  NA 140  K 3.5  CL 107  CO2 24  GLUCOSE 97  BUN 15  CREATININE 0.64  CALCIUM 8.8*   GFR: Estimated Creatinine Clearance: 79 mL/min (by C-G formula based on SCr of 0.64 mg/dL). Liver Function  Tests: Recent Labs  Lab 02/13/19 0415  AST 25  ALT 20  ALKPHOS 86  BILITOT 0.6  PROT 6.5  ALBUMIN 3.7   No results for input(s): LIPASE, AMYLASE in the last 168 hours. No results for input(s): AMMONIA in the last 168 hours. Coagulation Profile: Recent Labs  Lab 02/13/19 0415  INR 1.0   Cardiac Enzymes: No results for input(s): CKTOTAL, CKMB, CKMBINDEX, TROPONINI in the last 168 hours. BNP (last 3 results) No results for input(s): PROBNP in the last 8760 hours. HbA1C: No results for input(s): HGBA1C in the last 72 hours. CBG: Recent Labs  Lab 02/13/19 0619  GLUCAP 86   Lipid Profile: Recent Labs    02/13/19 0415  CHOL 244*  HDL 50  LDLCALC 172*  TRIG 112  CHOLHDL 4.9   Thyroid Function Tests: No results for input(s): TSH, T4TOTAL, FREET4, T3FREE, THYROIDAB in the last 72 hours. Anemia Panel: No results for input(s): VITAMINB12, FOLATE, FERRITIN, TIBC, IRON, RETICCTPCT in the last 72 hours. Urine analysis: No results found for: COLORURINE, APPEARANCEUR, LABSPEC, PHURINE, GLUCOSEU, HGBUR, BILIRUBINUR, KETONESUR, PROTEINUR, UROBILINOGEN, NITRITE, LEUKOCYTESUR Sepsis Labs: @LABRCNTIP (procalcitonin:4,lacticidven:4) ) Recent Results (from the past 240 hour(s))  MRSA PCR Screening     Status: None   Collection Time: 02/13/19  6:30 AM   Specimen: Nasal Mucosa; Nasopharyngeal  Result Value Ref Range Status   MRSA by PCR NEGATIVE NEGATIVE Final    Comment:        The GeneXpert MRSA Assay (FDA approved for NASAL specimens only), is one component of a comprehensive MRSA colonization surveillance program. It is not intended to diagnose MRSA infection nor to guide or monitor treatment for MRSA infections. Performed at [facility], [address] [place], [place]   SARS Coronavirus 2 by RT PCR (hospital order, performed in [facility] hospital lab) Nasopharyngeal Nasopharyngeal Swab     Status: None   Collection Time: 02/13/19  6:30 AM    Specimen: Nasopharyngeal Swab  Result Value Ref Range Status   SARS Coronavirus 2 NEGATIVE NEGATIVE Final    Comment: (NOTE) SARS-CoV-2 target nucleic acids are NOT DETECTED. The SARS-CoV-2 RNA is generally detectable in upper and lower respiratory specimens during the acute phase of  infection. The lowest concentration of SARS-CoV-2 viral copies this assay can detect is 250 copies / mL. A negative result does not preclude SARS-CoV-2 infection and should not be used as the sole basis for treatment or other patient management decisions.  A negative result may occur with improper specimen collection / handling, submission of specimen other than nasopharyngeal swab, presence of viral mutation(s) within the areas targeted by this assay, and inadequate number of viral copies (<250 copies / mL). A negative result must be combined with clinical observations, patient history, and epidemiological information. Fact Sheet for Patients:   StrictlyIdeas.no Fact Sheet for Healthcare Providers: BankingDealers.co.za This test is not yet approved or cleared  by the [place] FDA and has been authorized for detection and/or diagnosis of SARS-CoV-2 by FDA under an Emergency Use Authorization (EUA).  This EUA will remain in effect (meaning this test can be used) for the duration of the COVID-19 declaration under Section 564(b)(1) of the Act, 21 U.S.C. section 360bbb-3(b)(1), unless the authorization is terminated or revoked sooner. Performed at [facility], [address], [place], [place] [address]      Radiological Exams on Admission: No results found.   EKG: Independently reviewed.  Sinus rhythm, QTC 461, multiple PVC, early R wave progression  Assessment/Plan Principal Problem:   Acute ST elevation myocardial infarction (STEMI) involving left anterior descending (LAD) coronary artery (HCC) Active Problems:   Hypertension   COPD  (chronic obstructive pulmonary disease) (HCC)   Coronary artery disease   Depression   Anxiety   HLD (hyperlipidemia)   Hx of CAD and acute ST elevation myocardial infarction (STEMI) involving left anterior descending (LAD) coronary artery ([place]): trop 469 -->6834. s/p of cardiac cath, still has some mild chest pain and mild shortness of breath.  -Continue to stepdown as inpatient -prn morphine and NGT -check A1c -check FLP -->LDL 172 -Follow-up cardiologist recommendation as follows,  Recommendations: 1. Medical therapy 2. Dual antiplatelet therapy 3. 2D echocardiogram 4. Resume metoprolol succinate and isosorbide mononitrate 5. Resume simvastati  Hypertension: -Metoprolol -As needed hydralazine orally  Depression and anxiety: Stable, no suicidal or homicidal ideations. -Continue home medications: Xanax, Paxil  COPD (chronic obstructive pulmonary disease) (HCC): stable -Bronchodilators  HLD (hyperlipidemia): -Simvastatin     Inpatient status:  # Patient requires inpatient status due to high intensity of service, high risk for further deterioration and high frequency of surveillance required.  I certify that at the point of admission it is my clinical judgment that the patient will require inpatient hospital care spanning beyond 2 midnights from the point of admission.  . This patient has multiple chronic comorbidities including CAD, status post multiple stents in the distal mid and proximal RCA, hypertension, hyperlipidemia, diet-controlled diabetes, COPD, asthma, depression with anxiety, tremor . Now patient has presenting with STEMI . The initial radiographic and laboratory data are worrisome because of ST elevation on EKG and elevated trop . Current medical needs: please see my assessment and plan . Predictability of an adverse outcome (risk): Patient has multiple comorbidities as listed above, now presents with STEMI and elevated troponin.  Patient underwent  urgent cardiac cath, still has some chest pain or shortness breath.  Her presentation is highly complicated.  Patient is at high risk of deteriorating.  Will need to be treated in hospital for at least 2 days.         DVT ppx: SQ Heparin      Code Status: Full code Family Communication: None at bed side.  Disposition Plan:  Anticipate discharge back to previous home environment Consults called:  Card, Dr. Darrold JunkerParaschos Admission status:   SDU/inpation       Date of Service 02/13/2019    Lorretta HarpXilin Cedric Mcclaine [facility]   If 7PM-7AM, please contact night-coverage [email or web address] Password [email or web address] 02/13/2019, 9:44 AM

## 2019-02-13 NOTE — ED Notes (Signed)
Pt with urge to urinate, placed on bedpan.

## 2019-02-13 NOTE — Plan of Care (Deleted)
Assisted tele visit to patient with family member.  Aloma Boch Anderson, RN   

## 2019-02-13 NOTE — ED Notes (Addendum)
Paraschios at bedside.

## 2019-02-13 NOTE — Consult Note (Signed)
[facility] Cardiology  CARDIOLOGY CONSULT NOTE  Patient ID: Courtney Blanchard MRN: [id number] DOB/AGE: [birth year] 61 y.o.  Admit date: 02/13/2019 Referring Physician Derrill Kay Primary Physician [facility] Primary Cardiologist Deshun Sedivy Reason for Consultation anterior STEMI  HPI: 63 year old female referred for evaluation for anterior STEMI.  The patient has known coronary disease, status post multiple stents in the distal mid and proximal RCA.  She was in usual state of health until 02/12/2019 when she experienced intermittent episodes of substernal chest pain.  Patient went to sleep and awoke with severe substernal chest pain and was brought to [facility] ED via EMS.  ECG in route revealed ST elevations in the anterior leads.  Emergency room, chest pain was improved with apparent resolution of ST elevation.  Review of systems complete and found to be negative unless listed above     Past Medical History:  Diagnosis Date  . Anxiety   . Asthma   . Bronchitis   . Chronic cough   . Complication of anesthesia    nausea and vomiting  . COPD (chronic obstructive pulmonary disease) (HCC)   . Coronary artery disease   . Depression   . Diabetes mellitus without complication (HCC)   . Diverticulosis   . Dyspnea   . Dysrhythmia   . Environmental and seasonal allergies   . Hypertension   . Lower extremity edema   . Tremors of nervous system   . Wheezing     Past Surgical History:  Procedure Laterality Date  . ABDOMINAL HYSTERECTOMY    . BARIATRIC SURGERY    . CARDIAC CATHETERIZATION     with stent  . CHOLECYSTECTOMY    . COLON SURGERY    . LEFT HEART CATH AND CORONARY ANGIOGRAPHY N/A 01/28/2018   Procedure: LEFT HEART CATH AND CORONARY ANGIOGRAPHY;  Surgeon: Dalia Heading, MD;  Location: [facility] INVASIVE CV LAB;  Service: Cardiovascular;  Laterality: N/A;    Medications Prior to Admission  Medication Sig Dispense Refill Last Dose  . acetaminophen (TYLENOL) 500 MG tablet Take 500-1,000 mg by mouth  2 (two) times daily as needed for moderate pain or headache.      . ALPRAZolam (XANAX) 0.5 MG tablet Take 0.5 mg by mouth 2 (two) times daily as needed for anxiety.      Marland Kitchen aspirin 81 MG tablet Take 81 mg by mouth daily.      Marland Kitchen atorvastatin (LIPITOR) 40 MG tablet Take 1 tablet (40 mg total) by mouth daily at 6 PM. (Patient not taking: Reported on 02/01/2018) 30 tablet 0   . doxycycline (VIBRAMYCIN) 100 MG capsule Take 100 mg by mouth daily.      Marland Kitchen gabapentin (NEURONTIN) 300 MG capsule Take 600 mg by mouth at bedtime.      . isosorbide mononitrate (IMDUR) 30 MG 24 hr tablet Take 1 tablet (30 mg total) by mouth daily. 30 tablet 0   . metoprolol succinate (TOPROL-XL) 25 MG 24 hr tablet Take 12.5 mg by mouth daily.      . montelukast (SINGULAIR) 10 MG tablet Take 10 mg by mouth daily.      Marland Kitchen PARoxetine (PAXIL) 20 MG tablet Take 40 mg by mouth at bedtime.       Social History   Socioeconomic History  . Marital status: Married    Spouse name: Not on file  . Number of children: Not on file  . Years of education: Not on file  . Highest education level: Not on file  Occupational History  . Not  on file  Social Needs  . Financial resource strain: Not on file  . Food insecurity    Worry: Not on file    Inability: Not on file  . Transportation needs    Medical: Not on file    Non-medical: Not on file  Tobacco Use  . Smoking status: Never Smoker  . Smokeless tobacco: Never Used  Substance and Sexual Activity  . Alcohol use: No  . Drug use: No  . Sexual activity: Never  Lifestyle  . Physical activity    Days per week: Not on file    Minutes per session: Not on file  . Stress: Not on file  Relationships  . Social Herbalist on phone: Not on file    Gets together: Not on file    Attends religious service: Not on file    Active member of club or organization: Not on file    Attends meetings of clubs or organizations: Not on file    Relationship status: Not on file  . Intimate  partner violence    Fear of current or ex partner: Not on file    Emotionally abused: Not on file    Physically abused: Not on file    Forced sexual activity: Not on file  Other Topics Concern  . Not on file  Social History Narrative  . Not on file    Family History  Problem Relation Age of Onset  . Hypertension Mother   . Heart disease Father   . CAD Father   . Heart disease Sister   . Stroke Sister       Review of systems complete and found to be negative unless listed above      PHYSICAL EXAM  General: Well developed, well nourished, in no acute distress HEENT:  Normocephalic and atramatic Neck:  No JVD.  Lungs: Clear bilaterally to auscultation and percussion. Heart: HRRR . Normal S1 and S2 without gallops or murmurs.  Abdomen: Bowel sounds are positive, abdomen soft and non-tender  Msk:  Back normal, normal gait. Normal strength and tone for age. Extremities: No clubbing, cyanosis or edema.   Neuro: Alert and oriented X 3. Psych:  Good affect, responds appropriately  Labs:   Lab Results  Component Value Date   WBC 4.5 02/13/2019   HGB 11.9 (L) 02/13/2019   HCT 36.9 02/13/2019   MCV 89.1 02/13/2019   PLT 199 02/13/2019    Recent Labs  Lab 02/13/19 0415  NA 140  K 3.5  CL 107  CO2 24  BUN 15  CREATININE 0.64  CALCIUM 8.8*  PROT 6.5  BILITOT 0.6  ALKPHOS 86  ALT 20  AST 25  GLUCOSE 97   Lab Results  Component Value Date   TROPONINI 1.09 (HH) 01/26/2018    Lab Results  Component Value Date   CHOL 244 (H) 02/13/2019   Lab Results  Component Value Date   HDL 50 02/13/2019   Lab Results  Component Value Date   LDLCALC 172 (H) 02/13/2019   Lab Results  Component Value Date   TRIG 112 02/13/2019   Lab Results  Component Value Date   CHOLHDL 4.9 02/13/2019   No results found for: LDLDIRECT    Radiology: No results found.  EKG: Sinus rhythm with ST elevation in leads V2-V5  ASSESSMENT AND PLAN:   1.  Anterior ST elevation  myocardial infarction 2.  Known coronary artery disease, status post multiple coronary stents RCA 3.  Type 2 diabetes, insulin requiring 4.  Essential hypertension 5.  Hyperlipidemia  Recommendations  1.  Emergent cardiac catheterization and probable primary PCI  Signed: Marcina Millardlexander Jaquesha Boroff MD,PhD, [facility] CenterFACC 02/13/2019, 5:58 AM

## 2019-02-13 NOTE — Progress Notes (Signed)
Assisted tele visit to patient with son.  Courtney Karnes Anderson, RN   

## 2019-02-13 NOTE — ED Triage Notes (Signed)
Pt arrived via [facility] with a STEMI. Chest pain around 1 pm , took a nitro and it went away. Pain returned at 4 pm and took another nitro. Last MI was 2003. Pt took ASA and 4 nitro all together. Ptan in 4/10. Vitals WNL.

## 2019-02-13 NOTE — Progress Notes (Signed)
   02/13/19 0452  Clinical Encounter Type  Visited With Family;Health care provider  Visit Type Initial;Code  Referral From Nurse  Stress Factors  Family Stress Factors Health changes   Chaplain received a page to support the sister of this patient. Chaplain arrived and met the patient's sister in the family waiting room of the ED. Chaplain escorted the patient's sister to the [facility] Waiting room and engaged in conversation about the patient's history of present illness, familial history, family dynamics and the patient's sister's love for the patient. The patient's sister has concern for her sister and the stress she is under as a caregiver to many. This chaplain provided support in the form of active and reflective listening, theological reflection, encouragement, and processing. The patient's sister welcomes follow-up care from chaplain services.

## 2019-02-14 LAB — HIV ANTIBODY (ROUTINE TESTING W REFLEX): HIV Screen 4th Generation wRfx: NONREACTIVE

## 2019-02-14 NOTE — Progress Notes (Signed)
[facility] Progress Note  Patient: Courtney Blanchard   PCP: Courtney Pink, Courtney Blanchard DOB: Jul 21, 1955   DOA: 02/13/2019   DOS: 02/14/2019   Date of Service: the patient was seen and examined on 02/14/2019  Chief Complaint  Patient presents with  . Code STEMI     Brief hospital course: Courtney Blanchard is a 63 y.o. female with medical history significant of CAD, status post multiple stents in the distal mid and proximal RCA, hypertension, hyperlipidemia, diet-controlled diabetes, COPD, asthma, depression with anxiety, tremor, who presents with chest pain  Pt was in usual state of health until 02/12/2019 when she experienced intermittent episodes of substernal chest pain. Pt was brought to ED via EMS. ECG in route revealed ST elevations in the anterior leads. At arrival to Emergency room, her chest pain improved with apparent resolution of ST elevation. Pt underwent urgent cardiac catheterization in the early morning. Currently, she states that she has mild substernal chest pain and mild shortness of breath.  She has intermittent mild dry cough.  No fever or chills.  Denies nausea vomiting, diarrhea, abdominal pain, symptoms of UTI or unilateral weakness.  Currently further plan is monitor post cath course..  Subjective: Denies any chest pain has some shortness of breath.  No nausea no vomiting.  Has some swelling in the leg.  Assessment and Plan: Scheduled Meds: . aspirin EC  81 mg Oral Daily  . clopidogrel  75 mg Oral Daily  . dextromethorphan  30 mg Oral Q12H  . gabapentin  600 mg Oral QHS  . guaiFENesin  600 mg Oral BID  . heparin  5,000 Units Subcutaneous Q8H  . isosorbide mononitrate  30 mg Oral Daily  . metoprolol succinate  25 mg Oral Daily  . montelukast  10 mg Oral Daily  . PARoxetine  40 mg Oral QHS  . simvastatin  40 mg Oral q1800  . sodium chloride flush  3 mL Intravenous Q12H   Continuous Infusions: . sodium chloride 20 mL/hr at 02/13/19 0418  . sodium  chloride     PRN Meds: sodium chloride, acetaminophen, albuterol, ALPRAZolam, morphine injection, nitroGLYCERIN, ondansetron (ZOFRAN) IV, sodium chloride flush Hx of CAD and acute ST elevation myocardial infarction (STEMI) involving left anterior descending (LAD) coronary artery ([place]): trop 469 -->5027. s/p of cardiac cath, still has some mild chest pain and mild shortness of breath. PVCs  -prn morphine and NGT LDL 172 -Follow-up cardiologist recommendation as follows,  Recommendations: 1. Medical therapy 2. Dual antiplatelet therapy 3. 2D echocardiogram 4. Resume metoprolol succinate and isosorbide mononitrate 5. Resume simvastatin  Hypertension: -Metoprolol -As needed hydralazine orally  Depression and anxiety: Stable, no suicidal or homicidal ideations. -Continue home medications: Xanax, Paxil  COPD (chronic obstructive pulmonary disease) (HCC): stable -Bronchodilators  HLD (hyperlipidemia): -Simvastatin  Obesity Body mass index is 37.04 kg/m.  Outpatient dietary consultation.  Diet: Cardiac diet  DVT Prophylaxis: Subcutaneous Heparin    Advance goals of care discussion: Full code  Family Communication: no family was present at bedside, at the time of interview.   Disposition:  Discharge to home.  Consultants: Cardiology  Procedures: Cardiac Catheterization   Antibiotics: Anti-infectives (From admission, onward)   None       Objective: Physical Exam: Vitals:   02/14/19 1100 02/14/19 1226 02/14/19 1455 02/14/19 1916  BP: 108/67 124/80 (!) 93/53 (!) 97/55  Pulse: 60 (!) 58 70 65  Resp: 19     Temp:  98.7 F (37.1 C) 98.2 F (36.8 C) 98.9  F (37.2 C)  TempSrc:  Oral Oral Oral  SpO2: 95% 98% 95% 100%  Weight:  91.9 kg    Height:  5\' 2"  (1.575 m)      Intake/Output Summary (Last 24 hours) at 02/14/2019 1939 Last data filed at 02/14/2019 1000 Gross per 24 hour  Intake 240 ml  Output 800 ml  Net -560 ml   Filed Weights   02/13/19 0426  02/13/19 0600 02/14/19 1226  Weight: 92.1 kg 94.3 kg 91.9 kg   General: alert and oriented to time, place, and person. Appear in mild distress, affect appropriate Eyes: PERRL, Conjunctiva normal ENT: Oral Mucosa Clear, moist  Neck: no JVD, no Abnormal Mass Or lumps Cardiovascular: S1 and S2 Present, no Murmur,  Respiratory: good respiratory effort, Bilateral Air entry equal and Decreased, no signs of accessory muscle use, Clear to Auscultation, no Crackles, no wheezes Abdomen: Bowel Sound present, Soft and no tenderness, no hernia Skin: no rashes  Extremities: no Pedal edema, no calf tenderness Neurologic: without any new focal findings Gait not checked due to patient safety concerns  Data Reviewed: I have personally reviewed and interpreted daily labs, tele strips, imagings as discussed above. I reviewed all nursing notes, pharmacy notes, vitals, pertinent old records I have discussed plan of care as described above with RN and patient/family.  CBC: Recent Labs  Lab 02/13/19 0415  WBC 4.5  NEUTROABS 1.4*  HGB 11.9*  HCT 36.9  MCV 89.1  PLT 199   Basic Metabolic Panel: Recent Labs  Lab 02/13/19 0415  NA 140  K 3.5  CL 107  CO2 24  GLUCOSE 97  BUN 15  CREATININE 0.64  CALCIUM 8.8*    Liver Function Tests: Recent Labs  Lab 02/13/19 0415  AST 25  ALT 20  ALKPHOS 86  BILITOT 0.6  PROT 6.5  ALBUMIN 3.7   No results for input(s): LIPASE, AMYLASE in the last 168 hours. No results for input(s): AMMONIA in the last 168 hours. Coagulation Profile: Recent Labs  Lab 02/13/19 0415  INR 1.0   Cardiac Enzymes: No results for input(s): CKTOTAL, CKMB, CKMBINDEX, TROPONINI in the last 168 hours. BNP (last 3 results) No results for input(s): PROBNP in the last 8760 hours. CBG: Recent Labs  Lab 02/13/19 0619  GLUCAP 86   Studies: No results found.   Time spent: 35 minutes  Author: 14/03/20, Courtney Blanchard [facility] 02/14/2019 7:39 PM  To reach On-call,  see care teams to locate the attending and reach out to them via [email or web address]. If 7PM-7AM, please contact night-coverage If you still have difficulty reaching the attending provider, please page the [facility] (Director on Call) for [facility] on amion for assistance.

## 2019-02-14 NOTE — Progress Notes (Addendum)
Cardiovascular and Pulmonary Nurse Navigator Note:    63 year old female with medical history significant for coronary artery disease, status post multiple stents in the distal, mid and proximal RCA, hypertension, hyperlipidemia, diet controlled diabetes, chronic obstructive pulmonary disease, asthma, depression with anxiety, tremor, who presented to the ED with chest pain.    Patient ruled in for STEMI and was taken emergently to the cardiac cath lab.    Procedures  LEFT HEART CATH AND CORONARY ANGIOGRAPHY  Conclusion    Previously placed Mid RCA stent (unknown type) is widely patent.  Previously placed Prox RCA stent (unknown type) is widely patent.  Previously placed Ost RCA to Prox RCA stent (unknown type) is widely patent.  Mid LAD to Dist LAD lesion is 70% stenosed.   1.  One-vessel coronary artery disease with spontaneous coronary dissection mid to distal LAD 2.  Patent stents mid proximal and ostial RCA 3.  Mild reduced left ventricular function with apical dyskinesis  Recommendations 1.  Medical therapy 2.  Dual antiplatelet therapy 3.  2D echocardiogram 4.  Resume metoprolol succinate and isosorbide mononitrate 5.  Resume simvastatin   Echo perferomed on 02/13/2019 - Results pending.    EDUCATION:   "Heart Attack Bouncing Back" booklet given and reviewed with patient. Discussed the definition of  spontaneous coronary artery dissection (SCAD). Written information about SCAD provided /  reviewed to / with patient. ? In addition to the above information on SCAD the following was discussed / reviewed with patient:    Discussed modifiable risk factors for CAD including controlling blood pressure, cholesterol, and blood sugar; following heart healthy diet; maintaining healthy weight; exercise; and smoking cessation, if applicable.   ? Discussed cardiac medications including rationale for taking, mechanisms of action, and side effects. Stressed the importance of taking  medications as prescribed. Patient currently on the following cardiac medications:  Aspirin, Plavix, Imdur, Metoprolol, Nitroglycerin Sublingual, Simvastatin.   ? Discussed emergency plan for heart attack symptoms. Patient verbalized understanding of need to call 911 and not to drive herself to ER if having cardiac symptoms / chest pain.  Patient called EMS for care and transport to the ER this admission.   ? Diet of low sodium, low fat, low cholesterol heart healthy / carb modified diet discussed. Information on diet provided. Patient reported her diabetes is well-controlled with diet.   ? Smoking Cessation - Patient is a NEVER smoker.   ? Exercise - Benefits of exercised discussed. Encouraged patient to remain as active as possible.  Patient reported that she had been walking on a regular basis until her legs starting bothering her.  Her physician placed her on gabapentin to help her leg pain. NOTE:  Patient was not referred to outpatient Cardiac Rehab.  Patient stated that she has participated in Cardiac Rehab program in the past.  This RN checked with Dr. Saralyn Pilar regarding a referral, but no order for referral received as Dr. Saralyn Pilar indicated etiology for SCAD totally different from plaque rupture seen in typical MI.    Patient talked about her anxiety and how easy it is for her to become stressed and anxious.  Patient stated she needs to work on relaxing more and letting things go.    Patient appreciative of the above information.  ? Roanna Epley, RN, BSN, [place]  [facility] Cardiac & Pulmonary Rehab  Cardiovascular & Pulmonary Nurse Navigator  Direct Line: [phone]  Department Phone #: [phone] Fax: [phone]  Email Address: [email or web address]

## 2019-02-14 NOTE — Progress Notes (Signed)
Patient transferred to 2A from ICU via bed.  No complaints upon transfer. Patient s/p cardiac cath with R groin access. Site dry and intact.  No hematoma noted.  Family at bedside. Call bell in reach.

## 2019-02-14 NOTE — Progress Notes (Signed)
[facility] Cardiology  SUBJECTIVE: Patient laying in bed, denies chest pain or shortness of breath.  Patient reports she had a brief episode of chest discomfort last evening, without recurrence.   Vitals:   02/14/19 0455 02/14/19 0500 02/14/19 0600 02/14/19 0700  BP: 100/68 106/65 100/63 109/78  Pulse: 62 (!) 59 60 (!) 57  Resp: 17 16 17 16   Temp: 97.9 F (36.6 C)     TempSrc: Oral     SpO2: 92% 92% 92% 96%  Weight:      Height:         Intake/Output Summary (Last 24 hours) at 02/14/2019 0751 Last data filed at 02/14/2019 0456 Gross per 24 hour  Intake -  Output 1600 ml  Net -1600 ml      PHYSICAL EXAM  General: Well developed, well nourished, in no acute distress HEENT:  Normocephalic and atramatic Neck:  No JVD.  Lungs: Clear bilaterally to auscultation and percussion. Heart: HRRR . Normal S1 and S2 without gallops or murmurs.  Abdomen: Bowel sounds are positive, abdomen soft and non-tender  Msk:  Back normal, normal gait. Normal strength and tone for age. Extremities: No clubbing, cyanosis or edema.   Neuro: Alert and oriented X 3. Psych:  Good affect, responds appropriately   LABS: Basic Metabolic Panel: Recent Labs    02/13/19 0415  NA 140  K 3.5  CL 107  CO2 24  GLUCOSE 97  BUN 15  CREATININE 0.64  CALCIUM 8.8*   Liver Function Tests: Recent Labs    02/13/19 0415  AST 25  ALT 20  ALKPHOS 86  BILITOT 0.6  PROT 6.5  ALBUMIN 3.7   No results for input(s): LIPASE, AMYLASE in the last 72 hours. CBC: Recent Labs    02/13/19 0415  WBC 4.5  NEUTROABS 1.4*  HGB 11.9*  HCT 36.9  MCV 89.1  PLT 199   Cardiac Enzymes: No results for input(s): CKTOTAL, CKMB, CKMBINDEX, TROPONINI in the last 72 hours. BNP: Invalid input(s): POCBNP D-Dimer: No results for input(s): DDIMER in the last 72 hours. Hemoglobin A1C: No results for input(s): HGBA1C in the last 72 hours. Fasting Lipid Panel: Recent Labs    02/13/19 0415  CHOL 244*  HDL 50  LDLCALC 172*   TRIG 112  CHOLHDL 4.9   Thyroid Function Tests: No results for input(s): TSH, T4TOTAL, T3FREE, THYROIDAB in the last 72 hours.  Invalid input(s): FREET3 Anemia Panel: No results for input(s): VITAMINB12, FOLATE, FERRITIN, TIBC, IRON, RETICCTPCT in the last 72 hours.  No results found.   Echo pending  TELEMETRY: Sinus rhythm:  ASSESSMENT AND PLAN:  Principal Problem:   Acute ST elevation myocardial infarction (STEMI) involving left anterior descending (LAD) coronary artery (HCC) Active Problems:   Hypertension   COPD (chronic obstructive pulmonary disease) (HCC)   Coronary artery disease   Depression   Anxiety   HLD (hyperlipidemia)    1.  Anterior ST elevation myocardial infarction 2.  One-vessel CAD, with spontaneous coronary artery dissection mid LAD, with patent stents ostial/proximal/mid RCA 3.  Mild reduced left ventricular function with apical wall akinesis  Recommendations  1.  Agree with overall current therapy 2.  Continue dual antiplatelet therapy (aspirin and clopidogrel ) 3.  Continue home medications including metoprolol succinate, isosorbide mononitrate and simvastatin 4.  Transfer to telemetry 5.  May discharge to home in a.m. 6.  Follow-up with me in 1 week   Isaias Cowman, MD, PhD, [facility] 02/14/2019 7:51 AM

## 2019-02-14 NOTE — Progress Notes (Signed)
Cardiovascular and Pulmonary Nurse Navigator Note:   63 year old with history of hypertension, chronic obstructive pulmonary disease, coronary artery disease, depression, anxiety, and hyperlipidemia.    Patient admitted with diagnosis of STEMI involving left anterior descending artery.  Emergent Cardiac Cath was performed.    LEFT HEART CATH AND CORONARY ANGIOGRAPHY  Conclusion    Previously placed Mid RCA stent (unknown type) is widely patent.  Previously placed Prox RCA stent (unknown type) is widely patent.  Previously placed Ost RCA to Prox RCA stent (unknown type) is widely patent.  Mid LAD to Dist LAD lesion is 70% stenosed.   1.  One-vessel coronary artery disease with spontaneous coronary dissection mid to distal LAD 2.  Patent stents mid proximal and ostial RCA 3.  Mild reduced left ventricular function with apical dyskinesis  Recommendations  1.  Medical therapy 2.  Dual antiplatelet therapy 3.  2D echocardiogram 4.  Resume metoprolol succinate and isosorbide mononitrate 5.  Resume simvastatin   This RN sent Dr. Saralyn Pilar a secure chat message to see if this patient with spontaneous coronary artery dissection (SCAD) was appropriate for outpatient Cardiac Rehab.  Dr. Saralyn Pilar response was:  Etiology for SCAD totally different from plaque rupture seen in typical MI.    No referral for outpatient Cardiac Rehab.    Roanna Epley, RN, BSN, [place] [facility] Pul[facility] Rehab  Cardiovascular & Pulmonary Nurse Navigator  Direct Line: [phone]  Department Phone #: [phone] Fax: [phone]  Email Address: [email or web address]

## 2019-02-14 NOTE — Progress Notes (Signed)
Patient had 3 beats of VT x 2 and another  5 beats of VT. Awaiting a call from [facility] NP. Patient is asymptomatic. Will continue to monitor.

## 2019-02-15 LAB — TROPONIN I (HIGH SENSITIVITY)
Troponin I (High Sensitivity): 6562 ng/L (ref <18)
Troponin I (High Sensitivity): 6294 ng/L (ref <18)
Troponin I (High Sensitivity): 6987 ng/L (ref <18)

## 2019-02-15 LAB — CBC
HCT: 34.8 % — ABNORMAL LOW (ref 36.0–46.0)
Hemoglobin: 11.5 g/dL — ABNORMAL LOW (ref 12.0–15.0)
MCH: 29 pg (ref 26.0–34.0)
MCHC: 33 g/dL (ref 30.0–36.0)
MCV: 87.9 fL (ref 80.0–100.0)
Platelets: 167 10*3/uL (ref 150–400)
RBC: 3.96 MIL/uL (ref 3.87–5.11)
RDW: 12.9 % (ref 11.5–15.5)
WBC: 4.7 10*3/uL (ref 4.0–10.5)
nRBC: 0 % (ref 0.0–0.2)

## 2019-02-15 LAB — BASIC METABOLIC PANEL
Anion gap: 7 (ref 5–15)
BUN: 11 mg/dL (ref 8–23)
CO2: 27 mmol/L (ref 22–32)
Calcium: 8.8 mg/dL — ABNORMAL LOW (ref 8.9–10.3)
Chloride: 105 mmol/L (ref 98–111)
Creatinine, Ser: 0.65 mg/dL (ref 0.44–1.00)
GFR calc Af Amer: >60 mL/min (ref >60)
GFR calc non Af Amer: >60 mL/min (ref >60)
Glucose, Bld: 96 mg/dL (ref 70–99)
Potassium: 4 mmol/L (ref 3.5–5.1)
Sodium: 139 mmol/L (ref 135–145)

## 2019-02-15 LAB — APTT: aPTT: 39 seconds — ABNORMAL HIGH (ref 24–36)

## 2019-02-15 LAB — ECHOCARDIOGRAM COMPLETE
Height: 62 in
Weight: 3326.3 oz

## 2019-02-15 LAB — HEPARIN LEVEL (UNFRACTIONATED)
Heparin Unfractionated: 0.56 IU/mL (ref 0.30–0.70)
Heparin Unfractionated: 0.48 IU/mL (ref 0.30–0.70)

## 2019-02-15 LAB — PROTIME-INR
INR: 0.9 (ref 0.8–1.2)
Prothrombin Time: 12.4 seconds (ref 11.4–15.2)

## 2019-02-15 LAB — GLUCOSE, CAPILLARY: Glucose-Capillary: 97 mg/dL (ref 70–99)

## 2019-02-15 MED ORDER — METOPROLOL TARTRATE 25 MG PO TABS
12.5000 mg | ORAL_TABLET | Freq: Two times a day (BID) | ORAL | Status: DC
  Administered 2019-02-15 – 2019-02-16 (×4): 12.5 mg via ORAL
  Filled 2019-02-15 (×4): qty 1

## 2019-02-15 MED ORDER — NITROGLYCERIN IN D5W 200-5 MCG/ML-% IV SOLN
0.0000 ug/min | INTRAVENOUS | Status: DC
  Administered 2019-02-15: 5 ug/min via INTRAVENOUS
  Filled 2019-02-15: qty 250

## 2019-02-15 MED ORDER — HEPARIN (PORCINE) 25000 UT/250ML-% IV SOLN
900.0000 [IU]/h | INTRAVENOUS | Status: DC
  Administered 2019-02-15 – 2019-02-16 (×2): 900 [IU]/h via INTRAVENOUS
  Filled 2019-02-15 (×2): qty 250

## 2019-02-15 MED ORDER — CHLORHEXIDINE GLUCONATE CLOTH 2 % EX PADS
6.0000 | MEDICATED_PAD | Freq: Every day | CUTANEOUS | Status: DC
  Administered 2019-02-15 – 2019-02-16 (×2): 6 via TOPICAL

## 2019-02-15 MED ORDER — HEPARIN BOLUS VIA INFUSION
4000.0000 [IU] | Freq: Once | INTRAVENOUS | Status: AC
  Administered 2019-02-15: 4000 [IU] via INTRAVENOUS
  Filled 2019-02-15: qty 4000

## 2019-02-15 MED ORDER — HYDROMORPHONE HCL 1 MG/ML IJ SOLN
0.5000 mg | INTRAMUSCULAR | Status: DC | PRN
  Administered 2019-02-16: 0.5 mg via INTRAVENOUS
  Filled 2019-02-15: qty 1

## 2019-02-15 MED ORDER — MORPHINE SULFATE (PF) 2 MG/ML IV SOLN
2.0000 mg | INTRAVENOUS | Status: AC
  Administered 2019-02-15: 2 mg via INTRAVENOUS

## 2019-02-15 NOTE — Consult Note (Signed)
ANTICOAGULATION CONSULT NOTE   Pharmacy Consult for Hepairn Indication: chest pain/ACS  Allergies  Allergen Reactions  . Amoxicillin     Has patient had a PCN reaction causing immediate rash, facial/tongue/throat swelling, SOB or lightheadedness with hypotension: Yes Has patient had a PCN reaction causing severe rash involving mucus membranes or skin necrosis: No Has patient had a PCN reaction that required hospitalization: No Has patient had a PCN reaction occurring within the last 10 years: No If all of the above answers are "NO", then may proceed with Cephalosporin use.  Sore throat   . Atorvastatin     Tremors     Patient Measurements: Height: 5\' 2"  (157.5 cm) Weight: 202 lb 8 oz (91.9 kg) IBW/kg (Calculated) : 50.1 Heparin Dosing Weight: 71.4 kg  Vital Signs: Temp: 98.2 F (36.8 C) (12/05 0559) BP: 139/85 (12/05 0952) Pulse Rate: 59 (12/05 0952)  Labs: Recent Labs    02/13/19 0415  02/13/19 0926 02/13/19 1222 02/13/19 1500 02/15/19 0849  HGB 11.9*  --   --   --   --  11.5*  HCT 36.9  --   --   --   --  34.8*  PLT 199  --   --   --   --  167  APTT 34  --   --   --   --   --   LABPROT 12.6  --   --   --   --   --   INR 1.0  --   --   --   --   --   CREATININE 0.64  --   --   --   --  0.65  TROPONINIHS 469*   < > 6,518* 5,066* 4,692*  --    < > = values in this interval not displayed.    Estimated Creatinine Clearance: 77.9 mL/min (by C-G formula based on SCr of 0.65 mg/dL).   Medical History: Past Medical History:  Diagnosis Date  . Anxiety   . Asthma   . Bronchitis   . Chronic cough   . Complication of anesthesia    nausea and vomiting  . COPD (chronic obstructive pulmonary disease) (HCC)   . Coronary artery disease   . Depression   . Diabetes mellitus without complication (HCC)   . Diverticulosis   . Dyspnea   . Dysrhythmia   . Environmental and seasonal allergies   . Hypertension   . Lower extremity edema   . Tremors of nervous system    . Wheezing     Medications:  Medications Prior to Admission  Medication Sig Dispense Refill Last Dose  . ALPRAZolam (XANAX) 0.5 MG tablet Take 0.5 mg by mouth 2 (two) times daily as needed for anxiety.    Past Week at prn  . aspirin 81 MG tablet Take 81 mg by mouth daily.    Past Week at Unknown time  . gabapentin (NEURONTIN) 300 MG capsule Take 600 mg by mouth at bedtime.    Past Week at Unknown time  . metoprolol succinate (TOPROL-XL) 25 MG 24 hr tablet Take 12.5 mg by mouth daily.    Past Week at Unknown time  . montelukast (SINGULAIR) 10 MG tablet Take 10 mg by mouth daily.    Past Week at Unknown time  . PARoxetine (PAXIL) 20 MG tablet Take 40 mg by mouth at bedtime.    Past Week at Unknown time  . acetaminophen (TYLENOL) 500 MG tablet Take 500-1,000 mg by mouth 2 (  two) times daily as needed for moderate pain or headache.    prn at prn  . atorvastatin (LIPITOR) 40 MG tablet Take 1 tablet (40 mg total) by mouth daily at 6 PM. (Patient not taking: Reported on 02/01/2018) 30 tablet 0   . doxycycline (VIBRAMYCIN) 100 MG capsule Take 100 mg by mouth daily.    Completed Course at Unknown time  . DULoxetine (CYMBALTA) 20 MG capsule Take 20 mg by mouth daily.     . isosorbide mononitrate (IMDUR) 30 MG 24 hr tablet Take 1 tablet (30 mg total) by mouth daily. 30 tablet 0    Scheduled:  . aspirin EC  81 mg Oral Daily  . clopidogrel  75 mg Oral Daily  . dextromethorphan  30 mg Oral Q12H  . gabapentin  600 mg Oral QHS  . guaiFENesin  600 mg Oral BID  . heparin  5,000 Units Subcutaneous Q8H  . metoprolol tartrate  12.5 mg Oral BID  . montelukast  10 mg Oral Daily  . PARoxetine  40 mg Oral QHS  . simvastatin  40 mg Oral q1800  . sodium chloride flush  3 mL Intravenous Q12H   Infusions:  . sodium chloride 20 mL/hr at 02/13/19 0418  . sodium chloride    . nitroGLYCERIN     PRN: sodium chloride, acetaminophen, albuterol, ALPRAZolam, HYDROmorphone (DILAUDID) injection, morphine injection,  nitroGLYCERIN, ondansetron (ZOFRAN) IV, sodium chloride flush Anti-infectives (From admission, onward)   None      Assessment: Pharmacy consulted to start heparin for ACS. No DOAC PTA.   Goal of Therapy:  Heparin level 0.3-0.7 units/ml Monitor platelets by anticoagulation protocol: Yes   Plan:  Give 4000 units bolus x 1 Start heparin infusion at 900 units/hr Check anti-Xa level in 6 hours and daily while on heparin Continue to monitor H&H and platelets  Oswald Hillock, PharmD, BCPS 02/15/2019,10:05 AM

## 2019-02-15 NOTE — Progress Notes (Signed)
Pt complains of 10/10 mid chest pain radiating to right arm. SL nitro given x2 per order. VSS. Pt reports little relief after 2nd nitro and request morphine. 2 mg morphine given. Pt reports pain at 8/10 after IV morphine. Stat EKG will be obtained. RN will notify MD and continue to assess.

## 2019-02-15 NOTE — Progress Notes (Signed)
[place] notified. Orders to give 2 more MG of morphine received. I will continue to monitor.

## 2019-02-15 NOTE — Plan of Care (Signed)
  Problem: Activity: Goal: Ability to tolerate increased activity will improve Outcome: Progressing   Problem: Cardiac: Goal: Ability to achieve and maintain adequate cardiopulmonary perfusion will improve Outcome: Progressing Goal: Vascular access site(s) Level 0-1 will be maintained Outcome: Progressing   Problem: Health Behavior/Discharge Planning: Goal: Ability to safely manage health-related needs after discharge will improve Outcome: Progressing

## 2019-02-15 NOTE — Progress Notes (Signed)
[facility]  SUBJECTIVE: Status post acute onset of chest pain substernal 10 out of 10 similar to initial presentation 48 hours ago EKG with ST elevation laterally concerning for spasm or scab treated with heparin nitrates morphine with significant improvement in symptoms repeat EKG.  We will transfer the patient to ICU and continue to treat aggressively medically but conservatively interventionally unless symptoms persist or worsen then will consider transfer to tertiary facility if symptoms persist.  Currently on aspirin Plavix IV heparin starting IV nitro morphine as needed continue metoprolol   Vitals:   02/15/19 0928 02/15/19 0952 02/15/19 1300 02/15/19 1318  BP: 123/88 139/85 115/69   Pulse: 63 (!) 59 (!) 54 (!) 55  Resp:   18 17  Temp:   98.4 F (36.9 C)   TempSrc:   Oral   SpO2: 97% 97% 94% 97%  Weight:   91.7 kg   Height:   5\' 2"  (1.575 m)      Intake/Output Summary (Last 24 hours) at 02/15/2019 1357 Last data filed at 02/14/2019 2352 Gross per 24 hour  Intake -  Output 300 ml  Net -300 ml      PHYSICAL EXAM  General: Well developed, well nourished, in no acute distress HEENT:  Normocephalic and atramatic Neck:  No JVD.  Lungs: Clear bilaterally to auscultation and percussion. Heart: HRRR . Normal S1 and S2 without gallops or murmurs.  Abdomen: Bowel sounds are positive, abdomen soft and non-tender  Msk:  Back normal, normal gait. Normal strength and tone for age. Extremities: No clubbing, cyanosis or edema.   Neuro: Alert and oriented X 3. Psych:  Good affect, responds appropriately   LABS: Basic Metabolic Panel: Recent Labs    02/13/19 0415 02/15/19 0849  NA 140 139  K 3.5 4.0  CL 107 105  CO2 24 27  GLUCOSE 97 96  BUN 15 11  CREATININE 0.64 0.65  CALCIUM 8.8* 8.8*   Liver Function Tests: Recent Labs    02/13/19 0415  AST 25  ALT 20  ALKPHOS 86  BILITOT 0.6  PROT 6.5  ALBUMIN 3.7   No results for input(s): LIPASE, AMYLASE in the last 72  hours. CBC: Recent Labs    02/13/19 0415 02/15/19 0849  WBC 4.5 4.7  NEUTROABS 1.4*  --   HGB 11.9* 11.5*  HCT 36.9 34.8*  MCV 89.1 87.9  PLT 199 167   Cardiac Enzymes: No results for input(s): CKTOTAL, CKMB, CKMBINDEX, TROPONINI in the last 72 hours. BNP: Invalid input(s): POCBNP D-Dimer: No results for input(s): DDIMER in the last 72 hours. Hemoglobin A1C: No results for input(s): HGBA1C in the last 72 hours. Fasting Lipid Panel: Recent Labs    02/13/19 0415  CHOL 244*  HDL 50  LDLCALC 172*  TRIG 112  CHOLHDL 4.9   Thyroid Function Tests: No results for input(s): TSH, T4TOTAL, T3FREE, THYROIDAB in the last 72 hours.  Invalid input(s): FREET3 Anemia Panel: No results for input(s): VITAMINB12, FOLATE, FERRITIN, TIBC, IRON, RETICCTPCT in the last 72 hours.  No results found.   Echo apical hypokinesis ejection fraction slightly reduced 40 to 45%  TELEMETRY: Sinus rhythm no change on telemetry EKG acutely today shows ST elevation during acute chest pain improved with medical therapy    ASSESSMENT AND PLAN:  Principal Problem:   Acute ST elevation myocardial infarction (STEMI) involving left anterior descending (LAD) coronary artery (HCC) Active Problems:   Hypertension   COPD (chronic obstructive pulmonary disease) (HCC)   Coronary artery disease  Depression   Anxiety   HLD (hyperlipidemia)    1.  Acute coronary syndrome acute chest pain with ST elevation changes on EKG lateral wall Recent STEMI presentation 12 3 with intervention deferred because of scad 3 coronary artery disease previous PCI and stent Hypertension Hyperlipidemia COPD Anxiety depression Borderline obesity . Plan Agree with aggressive therapy Will treat with anticoagulation with heparin 24 to 48 hours and IV nitroglycerin Continue beta-blockers aspirin 6 weeks Bradycardia with Plavix therapy for 6 to 12 months We will continue to defer intervention for now because of SCAD and  try to treat medically aggressively Hold Imdur while on IV nitroglycerin Morphine as needed for acute pain Continue to cycle enzymes If patient continued to have recurrent unstable symptoms with EKG changes will consider transfer to tertiary care center Currently patient symptoms of 1 to dramatically with medical therapy with heparin and nitrates so we will continue to treat conservatively here in ICU   Alwyn Pea, MD, 02/15/2019 1:57 PM

## 2019-02-15 NOTE — Progress Notes (Signed)
VS taken prior to nitro SL at 0908

## 2019-02-15 NOTE — Progress Notes (Signed)
[facility] Progress Note  Patient: Courtney Blanchard:[id number]   PCP: Maryland Pink, MD DOB: Oct 25, 1955   DOA: 02/13/2019   DOS: 02/15/2019   Date of Service: the patient was seen and examined on 02/15/2019  Chief Complaint  Patient presents with  . Code STEMI   Brief hospital course: Courtney A Reynoldsis a 63 y.o.femalewith medical history significant ofCAD, status post multiple stents in the distal mid and proximal RCA,hypertension, hyperlipidemia, diet-controlled diabetes, COPD, asthma, depression with anxiety, tremor,who presents with chest pain  Pt was in usual state of health until 02/12/2019 when she experienced intermittent episodes of substernal chest pain.Pt wasbrought to ED via EMS. ECG in route revealed ST elevations in the anterior leads.At arrival toEmergency room,herchest pain improved with apparent resolution of ST elevation. Pt underwent urgent cardiaccatheterizationin the early morning. Currently, she states that she has mildsubsternal chest pain and mild shortness of breath. She has intermittent mild dry cough. No fever or chills. Denies nausea vomiting, diarrhea, abdominal pain, symptoms of UTI or unilateral weakness.  Underwent cardiac catheterization on 02/13/2019, found to have spontaneous LAD dissection.  And patent stents.  Medical management was recommended by cardiology. On 02/15/2019 at 9:30 AM patient reported 10 out of 10 chest pain reoccurring in same fashion again.  Stat EKG showed ST elevation in lead I and aVL with T wave inversions in corresponding leads.  Discussed with cardiology again since the ST changes are correlating with her dissection Dr. Clayborn Bigness recommended conservative measures with initiation of IV heparin and IV nitroglycerin drip and transferred to ICU.  Dr. Clayborn Bigness will make further recommendation for patient's care.  Currently further plan is continue conservative measures..  Subjective: Reported chest pain not  reproducible but worsens with breathing.  No nausea no vomiting.  Also has some mild shortness of breath.  No diaphoresis.  No diarrhea.  No active bleeding.  No dizziness no lightheadedness.  No focal deficit.  Assessment and Plan: Scheduled Meds: . aspirin EC  81 mg Oral Daily  . clopidogrel  75 mg Oral Daily  . dextromethorphan  30 mg Oral Q12H  . gabapentin  600 mg Oral QHS  . guaiFENesin  600 mg Oral BID  . metoprolol tartrate  12.5 mg Oral BID  . montelukast  10 mg Oral Daily  . PARoxetine  40 mg Oral QHS  . simvastatin  40 mg Oral q1800  . sodium chloride flush  3 mL Intravenous Q12H   Continuous Infusions: . sodium chloride 20 mL/hr at 02/13/19 0418  . sodium chloride    . heparin 900 Units/hr (02/15/19 1045)  . nitroGLYCERIN     PRN Meds: sodium chloride, acetaminophen, albuterol, ALPRAZolam, HYDROmorphone (DILAUDID) injection, morphine injection, nitroGLYCERIN, ondansetron (ZOFRAN) IV, sodium chloride flush  1.  ST elevation MI. LAD dissection History of CAD. PVCs Essential hypertension Hyperlipidemia SP cardiac catheterization on 02/13/2019 which showed spontaneous dissection of the LAD. Repeat EKG on 02/15/2019 after complaints of chest pain shows ST elevation MI in lead I and aVL. Currently recommendation is for conservative measures with medical therapy. Patient started back on IV heparin as well as IV nitroglycerin gtt. Transferred to ICU. Cardiology recommends medical therapy for now. Continue metoprolol, continue simvastatin.  Continue dual antiplatelet. Echocardiogram shows 40 to 45% EF, no regional wall motion abnormality. Patient to remain n.p.o. for now until cardiology made the recommendation. Continue beta-blocker for PVC.  Temporarily changed from Toprol to Lopressor. Imdur transition to IV nitroglycerin for now. Continue simvastatin  2.  Depression and  anxiety Stable. Currently no active symptoms. Continue Xanax and Paxil.  3.  COPD Continue  inhalers. No active exacerbation for now.  4.  Obesity Body mass index is 37.04 kg/m.  Dietary consultation.  Diet: NPO pending cardiology evaluation DVT Prophylaxis: Therapeutic Anticoagulation with Heparin   Advance goals of care discussion: Full code  Family Communication: no family was present at bedside, at the time of interview.   Disposition:  Discharge to be determined .  Consultants: Cardiology Procedures: Cardiac Catheterization   Antibiotics: Anti-infectives (From admission, onward)   None       Objective: Physical Exam: Vitals:   02/15/19 0915 02/15/19 0920 02/15/19 0928 02/15/19 0952  BP: 119/78 123/80 123/88 139/85  Pulse: 66 66 63 (!) 59  Resp:      Temp:      TempSrc:      SpO2: 97% 98% 97% 97%  Weight:      Height:        Intake/Output Summary (Last 24 hours) at 02/15/2019 1213 Last data filed at 02/14/2019 2352 Gross per 24 hour  Intake -  Output 300 ml  Net -300 ml   Filed Weights   02/13/19 0426 02/13/19 0600 02/14/19 1226  Weight: 92.1 kg 94.3 kg 91.9 kg   General: alert and oriented to time, place, and person. Appear in marked distress, affect anxious Eyes: PERRL, Conjunctiva normal ENT: Oral Mucosa Clear, moist  Neck: no JVD, no Abnormal Mass Or lumps Cardiovascular: S1 and S2 Present, no Murmur,  Respiratory: good respiratory effort, Bilateral Air entry equal and Decreased, no signs of accessory muscle use, clear to Auscultation, no Crackles, no wheezes Abdomen: Bowel Sound present, Soft and no tenderness, no hernia Skin: no rashes  Extremities: no Pedal edema, no calf tenderness Neurologic: without any new focal findings Gait not checked due to patient safety concerns  Data Reviewed: I have personally reviewed and interpreted daily labs, tele strips, imagings as discussed above. I reviewed all nursing notes, pharmacy notes, vitals, pertinent old records I have discussed plan of care as described above with RN and patient/family.   CBC: Recent Labs  Lab 02/13/19 0415 02/15/19 0849  WBC 4.5 4.7  NEUTROABS 1.4*  --   HGB 11.9* 11.5*  HCT 36.9 34.8*  MCV 89.1 87.9  PLT 199 167   Basic Metabolic Panel: Recent Labs  Lab 02/13/19 0415 02/15/19 0849  NA 140 139  K 3.5 4.0  CL 107 105  CO2 24 27  GLUCOSE 97 96  BUN 15 11  CREATININE 0.64 0.65  CALCIUM 8.8* 8.8*    Liver Function Tests: Recent Labs  Lab 02/13/19 0415  AST 25  ALT 20  ALKPHOS 86  BILITOT 0.6  PROT 6.5  ALBUMIN 3.7   No results for input(s): LIPASE, AMYLASE in the last 168 hours. No results for input(s): AMMONIA in the last 168 hours. Coagulation Profile: Recent Labs  Lab 02/13/19 0415 02/15/19 1030  INR 1.0 0.9   Cardiac Enzymes: No results for input(s): CKTOTAL, CKMB, CKMBINDEX, TROPONINI in the last 168 hours. BNP (last 3 results) No results for input(s): PROBNP in the last 8760 hours. CBG: Recent Labs  Lab 02/13/19 0619  GLUCAP 86   Studies: No results found.   Time spent: 35 minutes  Author: Lynden Oxford, MD [facility] 02/15/2019 12:13 PM  To reach On-call, see care teams to locate the attending and reach out to them via [email or web address]. If 7PM-7AM, please contact night-coverage If you still have difficulty reaching the  attending provider, please page the [facility] (Director on Call) for [facility] on amion for assistance.

## 2019-02-15 NOTE — Consult Note (Signed)
[place] [facility] Indication: chest pain/ACS  Allergies  Allergen Reactions  . Amoxicillin     Has patient had a PCN reaction causing immediate rash, facial/tongue/throat swelling, SOB or lightheadedness with hypotension: Yes Has patient had a PCN reaction causing severe rash involving mucus membranes or skin necrosis: No Has patient had a PCN reaction that required hospitalization: No Has patient had a PCN reaction occurring within the last 10 years: No If all of the above answers are "NO", then may proceed with Cephalosporin use.  Sore throat   . Atorvastatin     Tremors     Patient Measurements: Height: 5\' 2"  (157.5 cm) Weight: 202 lb 2.6 oz (91.7 kg) IBW/kg (Calculated) : 50.1 Heparin Dosing Weight: 71.4 kg  Vital Signs: Temp: 98.4 F (36.9 C) (12/05 1300) Temp Source: Oral (12/05 1300) BP: 115/69 (12/05 1300) Pulse Rate: 55 (12/05 1318)  Labs: Recent Labs    02/13/19 0415  02/15/19 0849 02/15/19 1030 02/15/19 1435 02/15/19 1618  HGB 11.9*  --  11.5*  --   --   --   HCT 36.9  --  34.8*  --   --   --   PLT 199  --  167  --   --   --   APTT 34  --   --  39*  --   --   LABPROT 12.6  --   --  12.4  --   --   INR 1.0  --   --  0.9  --   --   HEPARINUNFRC  --   --   --   --   --  0.56  CREATININE 0.64  --  0.65  --   --   --   TROPONINIHS 469*   < > 6,562*  --  6,294* 6,987*   < > = values in this interval not displayed.    Estimated Creatinine Clearance: 77.8 mL/min (by C-G formula based on SCr of 0.65 mg/dL).   Medical History: Past Medical History:  Diagnosis Date  . Anxiety   . Asthma   . Bronchitis   . Chronic cough   . Complication of anesthesia    nausea and vomiting  . COPD (chronic obstructive pulmonary disease) ([place])   . Coronary artery disease   . Depression   . Diabetes mellitus without complication ([place])   . Diverticulosis   . Dyspnea   . Dysrhythmia   . Environmental and seasonal allergies    . Hypertension   . Lower extremity edema   . Tremors of nervous system   . Wheezing     Medications:  Medications Prior to Admission  Medication Sig Dispense Refill Last Dose  . ALPRAZolam (XANAX) 0.5 MG tablet Take 0.5 mg by mouth 2 (two) times daily as needed for anxiety.    Past Week at prn  . aspirin 81 MG tablet Take 81 mg by mouth daily.    Past Week at Unknown time  . gabapentin (NEURONTIN) 300 MG capsule Take 600 mg by mouth at bedtime.    Past Week at Unknown time  . metoprolol succinate (TOPROL-XL) 25 MG 24 hr tablet Take 12.5 mg by mouth daily.    Past Week at Unknown time  . montelukast (SINGULAIR) 10 MG tablet Take 10 mg by mouth daily.    Past Week at Unknown time  . PARoxetine (PAXIL) 20 MG tablet Take 40 mg by mouth at bedtime.    Past  Week at Unknown time  . acetaminophen (TYLENOL) 500 MG tablet Take 500-1,000 mg by mouth 2 (two) times daily as needed for moderate pain or headache.    prn at prn  . atorvastatin (LIPITOR) 40 MG tablet Take 1 tablet (40 mg total) by mouth daily at 6 PM. (Patient not taking: Reported on 02/01/2018) 30 tablet 0   . doxycycline (VIBRAMYCIN) 100 MG capsule Take 100 mg by mouth daily.    Completed Course at Unknown time  . DULoxetine (CYMBALTA) 20 MG capsule Take 20 mg by mouth daily.     . isosorbide mononitrate (IMDUR) 30 MG 24 hr tablet Take 1 tablet (30 mg total) by mouth daily. 30 tablet 0    Scheduled:  . aspirin EC  81 mg Oral Daily  . Chlorhexidine Gluconate Cloth  6 each Topical Daily  . clopidogrel  75 mg Oral Daily  . dextromethorphan  30 mg Oral Q12H  . gabapentin  600 mg Oral QHS  . guaiFENesin  600 mg Oral BID  . metoprolol tartrate  12.5 mg Oral BID  . montelukast  10 mg Oral Daily  . PARoxetine  40 mg Oral QHS  . simvastatin  40 mg Oral q1800  . sodium chloride flush  3 mL Intravenous Q12H   Infusions:  . sodium chloride 20 mL/hr at 02/13/19 0418  . sodium chloride    . heparin 900 Units/hr (02/15/19 1300)  .  nitroGLYCERIN 5 mcg/min (02/15/19 1515)   PRN: sodium chloride, acetaminophen, albuterol, ALPRAZolam, HYDROmorphone (DILAUDID) injection, morphine injection, nitroGLYCERIN, ondansetron (ZOFRAN) IV, sodium chloride flush Anti-infectives (From admission, onward)   None      Assessment: Pharmacy consulted to start heparin for ACS. No DOAC PTA.   Goal of Therapy:  Heparin level 0.3-0.7 units/ml Monitor platelets by anticoagulation protocol: Yes   Plan:  Give 4000 units bolus x 1 Start heparin infusion at 900 units/hr Check anti-Xa level in 6 hours and daily while on heparin Continue to monitor H&H and platelets  12/5:  HL @ 1618 = 0.56 Will continue pt on current rate and recheck HL in 6 hrs on 12/5 @ 2200.   Ata Pecha D, PharmD 02/15/2019,5:19 PM

## 2019-02-15 NOTE — Progress Notes (Signed)
Pt has remained alert and oriented x 4 with c/o 8/10 mid/upper CP with radiation to her right upper chest->Nitro gtt initiated. Pt has also c/o a subsequent frontal HA 8/10-> morphine 2mg  given x 2. Pt has remained in SB-50s on the cardiac monitor, BP WNL.  Pt has remained on RA, SpO2 > 90%, lung sounds clear to auscultation, NDN. Pt has tolerated ice chips with no nausea reported->cardiac diet ordered. Trending troponins, EKG performed upon unit arrival via cardiac monitor. Husband visiting and updated at bedside. CP reported a 2/10 at this current time.

## 2019-02-15 NOTE — Consult Note (Signed)
[place] [facility] Indication: chest pain/ACS  Allergies  Allergen Reactions  . Amoxicillin     Has patient had a PCN reaction causing immediate rash, facial/tongue/throat swelling, SOB or lightheadedness with hypotension: Yes Has patient had a PCN reaction causing severe rash involving mucus membranes or skin necrosis: No Has patient had a PCN reaction that required hospitalization: No Has patient had a PCN reaction occurring within the last 10 years: No If all of the above answers are "NO", then may proceed with Cephalosporin use.  Sore throat   . Atorvastatin     Tremors     Patient Measurements: Height: 5\' 2"  (157.5 cm) Weight: 202 lb 2.6 oz (91.7 kg) IBW/kg (Calculated) : 50.1 Heparin Dosing Weight: 71.4 kg  Vital Signs: Temp: 98.8 F (37.1 C) (12/05 2000) Temp Source: Oral (12/05 2000) BP: 113/61 (12/05 2200) Pulse Rate: 64 (12/05 2200)  Labs: Recent Labs    02/13/19 0415  02/15/19 0849 02/15/19 1030 02/15/19 1435 02/15/19 1618 02/15/19 2245  HGB 11.9*  --  11.5*  --   --   --   --   HCT 36.9  --  34.8*  --   --   --   --   PLT 199  --  167  --   --   --   --   APTT 34  --   --  39*  --   --   --   LABPROT 12.6  --   --  12.4  --   --   --   INR 1.0  --   --  0.9  --   --   --   HEPARINUNFRC  --   --   --   --   --  0.56 0.48  CREATININE 0.64  --  0.65  --   --   --   --   TROPONINIHS 469*   < > 6,562*  --  6,294* 6,987*  --    < > = values in this interval not displayed.    Estimated Creatinine Clearance: 77.8 mL/min (by C-G formula based on SCr of 0.65 mg/dL).   Medical History: Past Medical History:  Diagnosis Date  . Anxiety   . Asthma   . Bronchitis   . Chronic cough   . Complication of anesthesia    nausea and vomiting  . COPD (chronic obstructive pulmonary disease) ([place])   . Coronary artery disease   . Depression   . Diabetes mellitus without complication ([place])   . Diverticulosis   . Dyspnea    . Dysrhythmia   . Environmental and seasonal allergies   . Hypertension   . Lower extremity edema   . Tremors of nervous system   . Wheezing     Medications:  Medications Prior to Admission  Medication Sig Dispense Refill Last Dose  . ALPRAZolam (XANAX) 0.5 MG tablet Take 0.5 mg by mouth 2 (two) times daily as needed for anxiety.    Past Week at prn  . aspirin 81 MG tablet Take 81 mg by mouth daily.    Past Week at Unknown time  . gabapentin (NEURONTIN) 300 MG capsule Take 600 mg by mouth at bedtime.    Past Week at Unknown time  . metoprolol succinate (TOPROL-XL) 25 MG 24 hr tablet Take 12.5 mg by mouth daily.    Past Week at Unknown time  . montelukast (SINGULAIR) 10 MG tablet Take 10 mg by mouth  daily.    Past Week at Unknown time  . PARoxetine (PAXIL) 20 MG tablet Take 40 mg by mouth at bedtime.    Past Week at Unknown time  . acetaminophen (TYLENOL) 500 MG tablet Take 500-1,000 mg by mouth 2 (two) times daily as needed for moderate pain or headache.    prn at prn  . atorvastatin (LIPITOR) 40 MG tablet Take 1 tablet (40 mg total) by mouth daily at 6 PM. (Patient not taking: Reported on 02/01/2018) 30 tablet 0   . doxycycline (VIBRAMYCIN) 100 MG capsule Take 100 mg by mouth daily.    Completed Course at Unknown time  . DULoxetine (CYMBALTA) 20 MG capsule Take 20 mg by mouth daily.     . isosorbide mononitrate (IMDUR) 30 MG 24 hr tablet Take 1 tablet (30 mg total) by mouth daily. 30 tablet 0    Scheduled:  . aspirin EC  81 mg Oral Daily  . Chlorhexidine Gluconate Cloth  6 each Topical Daily  . clopidogrel  75 mg Oral Daily  . dextromethorphan  30 mg Oral Q12H  . gabapentin  600 mg Oral QHS  . guaiFENesin  600 mg Oral BID  . metoprolol tartrate  12.5 mg Oral BID  . montelukast  10 mg Oral Daily  . PARoxetine  40 mg Oral QHS  . simvastatin  40 mg Oral q1800  . sodium chloride flush  3 mL Intravenous Q12H   Infusions:  . sodium chloride 20 mL/hr at 02/13/19 0418  . sodium  chloride    . heparin 900 Units/hr (02/15/19 1300)  . nitroGLYCERIN 5 mcg/min (02/15/19 2000)   PRN: sodium chloride, acetaminophen, albuterol, ALPRAZolam, HYDROmorphone (DILAUDID) injection, morphine injection, nitroGLYCERIN, ondansetron (ZOFRAN) IV, sodium chloride flush Anti-infectives (From admission, onward)   None      Assessment: Pharmacy consulted to start heparin for ACS. No DOAC PTA.   12/5 Heparin infusion started @ 900 units/hr 12/5:  HL @ 1618 = 0.56- therapeutic x1 12/5 Hl @ 2245= 0.48 -Therapeutic x 2  Goal of Therapy:  Heparin level 0.3-0.7 units/ml Monitor platelets by anticoagulation protocol: Yes   Plan:  12/5 Hl @ 2245= 0.48 -Therapeutic x 2 Continue current infusion rate of  900 units/hr Check anti-Xa level and CBC daily while on heparin Continue to monitor H&H and platelets  Gardner Candle, PharmD, BCPS Clinical Pharmacist 02/15/2019 11:35 PM

## 2019-02-15 NOTE — Progress Notes (Signed)
MD Patel rounded on patient. EKG complete and another 2 mg morphine given. [place] notified and reviewing cath from yesterday.  I will continue to assess.

## 2019-02-16 LAB — CBC
HCT: 37.2 % (ref 36.0–46.0)
Hemoglobin: 12.3 g/dL (ref 12.0–15.0)
MCH: 28.9 pg (ref 26.0–34.0)
MCHC: 33.1 g/dL (ref 30.0–36.0)
MCV: 87.3 fL (ref 80.0–100.0)
Platelets: 190 10*3/uL (ref 150–400)
RBC: 4.26 MIL/uL (ref 3.87–5.11)
RDW: 12.6 % (ref 11.5–15.5)
WBC: 7.1 10*3/uL (ref 4.0–10.5)
nRBC: 0 % (ref 0.0–0.2)

## 2019-02-16 LAB — HEPARIN LEVEL (UNFRACTIONATED): Heparin Unfractionated: 0.44 IU/mL (ref 0.30–0.70)

## 2019-02-16 MED ORDER — INFLUENZA VAC SPLIT QUAD 0.5 ML IM SUSY: 0.5000 mL | PREFILLED_SYRINGE | INTRAMUSCULAR | Status: DC

## 2019-02-16 MED ORDER — DEXTROMETHORPHAN POLISTIREX ER 30 MG/5ML PO SUER
30.0000 mg | Freq: Two times a day (BID) | ORAL | Status: DC | PRN
  Filled 2019-02-16: qty 5

## 2019-02-16 NOTE — Plan of Care (Signed)
Nitroglycerine gtt off this afternoon.  Pt with very mild self reported rt arm tingling, pressure, subsided on its own.  Pt up to chair and toilet ad lib.  VSS on room air. Trx to stepdown service.

## 2019-02-16 NOTE — Progress Notes (Signed)
[facility] Progress Note  Patient: Courtney Blanchard ZHY:[id number]   PCP: Jerl MinaHedrick, James, MD DOB: [birth year]   DOA: 02/13/2019   DOS: 02/16/2019   Date of Service: the patient was seen and examined on 02/16/2019  Chief Complaint  Patient presents with  . Code STEMI   Brief hospital course: Arna A Reynoldsis a 63 y.o.femalewith medical history significant ofCAD, status post multiple stents in the distal mid and proximal RCA,hypertension, hyperlipidemia, diet-controlled diabetes, COPD, asthma, depression with anxiety, tremor,who presents with chest pain  Pt was in usual state of health until 02/12/2019 when she experienced intermittent episodes of substernal chest pain.Pt wasbrought to ED via EMS. ECG in route revealed ST elevations in the anterior leads.At arrival toEmergency room,herchest pain improved with apparent resolution of ST elevation. Pt underwent urgent cardiaccatheterizationin the early morning. Currently, she states that she has mildsubsternal chest pain and mild shortness of breath. She has intermittent mild dry cough. No fever or chills. Denies nausea vomiting, diarrhea, abdominal pain, symptoms of UTI or unilateral weakness.  Underwent cardiac catheterization on 02/13/2019, found to have spontaneous LAD dissection.  And patent stents.  Medical management was recommended by cardiology. On 02/15/2019 at 9:30 AM patient reported 10 out of 10 chest pain reoccurring in same fashion again.  Stat EKG showed ST elevation in lead I and aVL with T wave inversions in corresponding leads.  Discussed with cardiology again since the ST changes are correlating with her dissection Dr. Juliann Paresallwood recommended conservative measures with initiation of IV heparin and IV nitroglycerin drip and transferred to ICU.  Dr. Juliann Paresallwood will make further recommendation for patient's care.  Currently further plan is continue conservative measures..  Subjective: No further chest pain no nausea  no vomiting.  No fever no chills.  Breathing okay.  Reports some fluttering sensation in her heart.  Telemetry overnight shows no arrhythmia.  Assessment and Plan: Scheduled Meds: . aspirin EC  81 mg Oral Daily  . Chlorhexidine Gluconate Cloth  6 each Topical Daily  . clopidogrel  75 mg Oral Daily  . gabapentin  600 mg Oral QHS  . guaiFENesin  600 mg Oral BID  . [START ON 02/17/2019] influenza vac split quadrivalent PF  0.5 mL Intramuscular Tomorrow-1000  . metoprolol tartrate  12.5 mg Oral BID  . montelukast  10 mg Oral Daily  . PARoxetine  40 mg Oral QHS  . simvastatin  40 mg Oral q1800  . sodium chloride flush  3 mL Intravenous Q12H   Continuous Infusions: . heparin 900 Units/hr (02/15/19 1300)  . nitroGLYCERIN 5 mcg/min (02/16/19 0500)   PRN Meds: acetaminophen, albuterol, ALPRAZolam, dextromethorphan, HYDROmorphone (DILAUDID) injection, nitroGLYCERIN, ondansetron (ZOFRAN) IV, sodium chloride flush  1.  ST elevation MI. LAD dissection History of CAD. PVCs Essential hypertension Hyperlipidemia SP cardiac catheterization on 02/13/2019 which showed spontaneous dissection of the LAD. Repeat EKG on 02/15/2019 after complaints of chest pain shows ST elevation MI in lead I and aVL. Currently recommendation is for conservative measures with medical therapy. Patient started back on IV heparin as well as IV nitroglycerin gtt. Transferred to ICU. Cardiology recommends medical therapy for now. Continue metoprolol, continue simvastatin.  Continue dual antiplatelet. Echocardiogram shows 40 to 45% EF, no regional wall motion abnormality. Continue beta-blocker for PVC.  Temporarily changed from Toprol to Lopressor. Imdur transition to IV nitroglycerin for now. Continue simvastatin Will follow cardiology recommendation. Repeat EKG on 02/16/2019 personally evaluated does not show any evidence of significant ST elevation or pericarditis.  2.  Depression and  anxiety Stable. Currently no  active symptoms. Continue Xanax and Paxil.  3.  COPD Continue inhalers. No active exacerbation for now.  4.  Obesity Body mass index is 36.98 kg/m.  Dietary consultation.  5 poor appetite. Setting of ongoing cardiac issue. Hopefully will improve once she is out of this acute episode.  Diet: NPO pending cardiology evaluation DVT Prophylaxis: Therapeutic Anticoagulation with Heparin   Advance goals of care discussion: Full code  Family Communication: no family was present at bedside, at the time of interview.   Disposition:  Discharge to be determined .  Consultants: Cardiology Procedures: Cardiac Catheterization   Antibiotics: Anti-infectives (From admission, onward)   None       Objective: Physical Exam: Vitals:   02/16/19 0300 02/16/19 0400 02/16/19 0500 02/16/19 0826  BP: 126/70 102/63 112/66 121/63  Pulse: 63 62 (!) 59 67  Resp: 18 18 17    Temp:  98.7 F (37.1 C)    TempSrc:  Oral    SpO2: 94% 93% 95%   Weight:      Height:        Intake/Output Summary (Last 24 hours) at 02/16/2019 0928 Last data filed at 02/16/2019 0500 Gross per 24 hour  Intake 348.01 ml  Output 625 ml  Net -276.99 ml   Filed Weights   02/13/19 0600 02/14/19 1226 02/15/19 1300  Weight: 94.3 kg 91.9 kg 91.7 kg   General: alert and oriented to time, place, and person. Appear in mild distress, affect appropriate Eyes: PERRL, Conjunctiva normal ENT: Oral Mucosa Clear, moist  Neck: no JVD, no Abnormal Mass Or lumps Cardiovascular: S1 and S2 Present, no Murmur, peripheral pulses symmetrical Respiratory: good respiratory effort, Bilateral Air entry equal and Decreased, no signs of accessory muscle use, Clear to Auscultation, no Crackles, no wheezes Abdomen: Bowel Sound present, Soft and no tenderness, no hernia Skin: no rashes  Extremities: trace Pedal edema, no calf tenderness Neurologic: without any new focal findings Gait not checked due to patient safety concerns   Data  Reviewed: I have personally reviewed and interpreted daily labs, tele strips, imagings as discussed above. I reviewed all nursing notes, pharmacy notes, vitals, pertinent old records I have discussed plan of care as described above with RN and patient/family.  CBC: Recent Labs  Lab 02/13/19 0415 02/15/19 0849 02/16/19 0423  WBC 4.5 4.7 7.1  NEUTROABS 1.4*  --   --   HGB 11.9* 11.5* 12.3  HCT 36.9 34.8* 37.2  MCV 89.1 87.9 87.3  PLT 199 167 409   Basic Metabolic Panel: Recent Labs  Lab 02/13/19 0415 02/15/19 0849  NA 140 139  K 3.5 4.0  CL 107 105  CO2 24 27  GLUCOSE 97 96  BUN 15 11  CREATININE 0.64 0.65  CALCIUM 8.8* 8.8*    Liver Function Tests: Recent Labs  Lab 02/13/19 0415  AST 25  ALT 20  ALKPHOS 86  BILITOT 0.6  PROT 6.5  ALBUMIN 3.7   No results for input(s): LIPASE, AMYLASE in the last 168 hours. No results for input(s): AMMONIA in the last 168 hours. Coagulation Profile: Recent Labs  Lab 02/13/19 0415 02/15/19 1030  INR 1.0 0.9   Cardiac Enzymes: No results for input(s): CKTOTAL, CKMB, CKMBINDEX, TROPONINI in the last 168 hours. BNP (last 3 results) No results for input(s): PROBNP in the last 8760 hours. CBG: Recent Labs  Lab 02/13/19 0619 02/15/19 1246  GLUCAP 86 97   Studies: No results found.   Time spent: 35 minutes  Author: Lynden Oxford, MD [facility] 02/16/2019 9:28 AM  To reach On-call, see care teams to locate the attending and reach out to them via [email or web address]. If 7PM-7AM, please contact night-coverage If you still have difficulty reaching the attending provider, please page the [facility] (Director on Call) for [facility] on amion for assistance.

## 2019-02-16 NOTE — Progress Notes (Signed)
[facility] Cardiology  SUBJECTIVE: States to be doing reasonably well improved symptoms no further chest pain no angina patient now off of IV nitroglycerin EKG back to normal still maintained on heparin for anticoagulation.  Still has some vague right shoulder discomfort but none of the anginal symptoms that she had the previous evening   Vitals:   02/16/19 1500 02/16/19 1600 02/16/19 1700 02/16/19 1800  BP: 108/70 110/62 108/61 (!) 87/51  Pulse: 67 63 60 74  Resp: (!) 39     Temp:  99.1 F (37.3 C)    TempSrc:  Oral    SpO2: 95% 96% 98% 93%  Weight:      Height:         Intake/Output Summary (Last 24 hours) at 02/16/2019 1853 Last data filed at 02/16/2019 1800 Gross per 24 hour  Intake 243.13 ml  Output 625 ml  Net -381.87 ml      PHYSICAL EXAM  General: Well developed, well nourished, in no acute distress HEENT:  Normocephalic and atramatic Neck:  No JVD.  Lungs: Clear bilaterally to auscultation and percussion. Heart: HRRR . Normal S1 and S2 without gallops or murmurs.  Abdomen: Bowel sounds are positive, abdomen soft and non-tender  Msk:  Back normal, normal gait. Normal strength and tone for age. Extremities: No clubbing, cyanosis or edema.   Neuro: Alert and oriented X 3. Psych:  Good affect, responds appropriately   LABS: Basic Metabolic Panel: Recent Labs    02/15/19 0849  NA 139  K 4.0  CL 105  CO2 27  GLUCOSE 96  BUN 11  CREATININE 0.65  CALCIUM 8.8*   Liver Function Tests: No results for input(s): AST, ALT, ALKPHOS, BILITOT, PROT, ALBUMIN in the last 72 hours. No results for input(s): LIPASE, AMYLASE in the last 72 hours. CBC: Recent Labs    02/15/19 0849 02/16/19 0423  WBC 4.7 7.1  HGB 11.5* 12.3  HCT 34.8* 37.2  MCV 87.9 87.3  PLT 167 190   Cardiac Enzymes: No results for input(s): CKTOTAL, CKMB, CKMBINDEX, TROPONINI in the last 72 hours. BNP: Invalid input(s): POCBNP D-Dimer: No results for input(s): DDIMER in the last 72  hours. Hemoglobin A1C: No results for input(s): HGBA1C in the last 72 hours. Fasting Lipid Panel: No results for input(s): CHOL, HDL, LDLCALC, TRIG, CHOLHDL, LDLDIRECT in the last 72 hours. Thyroid Function Tests: No results for input(s): TSH, T4TOTAL, T3FREE, THYROIDAB in the last 72 hours.  Invalid input(s): FREET3 Anemia Panel: No results for input(s): VITAMINB12, FOLATE, FERRITIN, TIBC, IRON, RETICCTPCT in the last 72 hours.  No results found.   Echo mildly reduced overall left ventricular function anterior apical hypokinesis continue medical therapy  TELEMETRY: Sinus rhythm nonspecific ST-T wave changes:  ASSESSMENT AND PLAN:  Principal Problem:   Acute ST elevation myocardial infarction (STEMI) involving left anterior descending (LAD) coronary artery (HCC) Active Problems:   Hypertension   COPD (chronic obstructive pulmonary disease) (HCC)   Coronary artery disease   Depression   Anxiety   HLD (hyperlipidemia)    1.  Unstable angina improved wean off of IV nitroglycerin 2 discontinue heparin in the morning increase activity 3 recommend maintain on aspirin Plavix beta-blocker ACE inhibitor nitrates long-term 4 recommend conservative medical therapy do not recommend invasive interventional strategy 5 statin therapy for hyperlipidemia 6 continue hypertension management control 7 inhalers as needed for COPD symptoms 8 transfer to telemetry consider discharge within the next 24 to 48 hours   Yolonda Kida, MD, [facility] 02/16/2019  6:53 PM

## 2019-02-16 NOTE — Consult Note (Signed)
[facility] for [facility] Indication: chest pain/ACS  Allergies  Allergen Reactions  . Amoxicillin     Has patient had a PCN reaction causing immediate rash, facial/tongue/throat swelling, SOB or lightheadedness with hypotension: Yes Has patient had a PCN reaction causing severe rash involving mucus membranes or skin necrosis: No Has patient had a PCN reaction that required hospitalization: No Has patient had a PCN reaction occurring within the last 10 years: No If all of the above answers are "NO", then may proceed with Cephalosporin use.  Sore throat   . Atorvastatin     Tremors     Patient Measurements: Height: 5\' 2"  (157.5 cm) Weight: 202 lb 2.6 oz (91.7 kg) IBW/kg (Calculated) : 50.1 Heparin Dosing Weight: 71.4 kg  Vital Signs: Temp: 98.7 F (37.1 C) (12/06 0400) Temp Source: Oral (12/06 0400) BP: 112/66 (12/06 0500) Pulse Rate: 59 (12/06 0500)  Labs: Recent Labs    02/15/19 0849 02/15/19 1030 02/15/19 1435 02/15/19 1618 02/15/19 2245 02/16/19 0423  HGB 11.5*  --   --   --   --  12.3  HCT 34.8*  --   --   --   --  37.2  PLT 167  --   --   --   --  190  APTT  --  39*  --   --   --   --   LABPROT  --  12.4  --   --   --   --   INR  --  0.9  --   --   --   --   HEPARINUNFRC  --   --   --  0.56 0.48 0.44  CREATININE 0.65  --   --   --   --   --   [id number] 6,387*  --  6,294* 6,987*  --   --     Estimated Creatinine Clearance: 77.8 mL/min (by C-G formula based on SCr of 0.65 mg/dL).   Medical History: Past Medical History:  Diagnosis Date  . Anxiety   . Asthma   . Bronchitis   . Chronic cough   . Complication of anesthesia    nausea and vomiting  . COPD (chronic obstructive pulmonary disease) ([place])   . Coronary artery disease   . Depression   . Diabetes mellitus without complication ([place])   . Diverticulosis   . Dyspnea   . Dysrhythmia   . Environmental and seasonal allergies   . Hypertension   . Lower  extremity edema   . Tremors of nervous system   . Wheezing     Medications:  Medications Prior to Admission  Medication Sig Dispense Refill Last Dose  . ALPRAZolam (XANAX) 0.5 MG tablet Take 0.5 mg by mouth 2 (two) times daily as needed for anxiety.    Past Week at prn  . aspirin 81 MG tablet Take 81 mg by mouth daily.    Past Week at Unknown time  . gabapentin (NEURONTIN) 300 MG capsule Take 600 mg by mouth at bedtime.    Past Week at Unknown time  . metoprolol succinate (TOPROL-XL) 25 MG 24 hr tablet Take 12.5 mg by mouth daily.    Past Week at Unknown time  . montelukast (SINGULAIR) 10 MG tablet Take 10 mg by mouth daily.    Past Week at Unknown time  . PARoxetine (PAXIL) 20 MG tablet Take 40 mg by mouth at bedtime.    Past Week at Unknown time  .  acetaminophen (TYLENOL) 500 MG tablet Take 500-1,000 mg by mouth 2 (two) times daily as needed for moderate pain or headache.    prn at prn  . atorvastatin (LIPITOR) 40 MG tablet Take 1 tablet (40 mg total) by mouth daily at 6 PM. (Patient not taking: Reported on 02/01/2018) 30 tablet 0   . doxycycline (VIBRAMYCIN) 100 MG capsule Take 100 mg by mouth daily.    Completed Course at Unknown time  . DULoxetine (CYMBALTA) 20 MG capsule Take 20 mg by mouth daily.     . isosorbide mononitrate (IMDUR) 30 MG 24 hr tablet Take 1 tablet (30 mg total) by mouth daily. 30 tablet 0    Scheduled:  . aspirin EC  81 mg Oral Daily  . Chlorhexidine Gluconate Cloth  6 each Topical Daily  . clopidogrel  75 mg Oral Daily  . dextromethorphan  30 mg Oral Q12H  . gabapentin  600 mg Oral QHS  . guaiFENesin  600 mg Oral BID  . metoprolol tartrate  12.5 mg Oral BID  . montelukast  10 mg Oral Daily  . PARoxetine  40 mg Oral QHS  . simvastatin  40 mg Oral q1800  . sodium chloride flush  3 mL Intravenous Q12H   Infusions:  . sodium chloride 20 mL/hr at 02/13/19 0418  . sodium chloride    . heparin 900 Units/hr (02/15/19 1300)  . nitroGLYCERIN 5 mcg/min (02/16/19  0500)   PRN: sodium chloride, acetaminophen, albuterol, ALPRAZolam, HYDROmorphone (DILAUDID) injection, morphine injection, nitroGLYCERIN, ondansetron (ZOFRAN) IV, sodium chloride flush Anti-infectives (From admission, onward)   None      Assessment: Pharmacy consulted to start heparin for ACS. No DOAC PTA.   12/5 Heparin infusion started @ 900 units/hr 12/5:  HL @ 1618 = 0.56- therapeutic x1 12/5 Hl @ 2245= 0.48 -Therapeutic x 2  Goal of Therapy:  Heparin level 0.3-0.7 units/ml Monitor platelets by anticoagulation protocol: Yes   Plan:  12/6 HL @ 0423= 0.44 -Level remains therapeutic. Hgb/PLts stable.   Continue current infusion rate of  900 units/hr Check anti-Xa level and CBC daily while on heparin Continue to monitor H&H and platelets  Gardner Candle, PharmD, BCPS Clinical Pharmacist 02/16/2019 6:09 AM

## 2019-02-17 LAB — CBC
HCT: 37.9 % (ref 36.0–46.0)
Hemoglobin: 12.1 g/dL (ref 12.0–15.0)
MCH: 28.9 pg (ref 26.0–34.0)
MCHC: 31.9 g/dL (ref 30.0–36.0)
MCV: 90.5 fL (ref 80.0–100.0)
Platelets: 202 10*3/uL (ref 150–400)
RBC: 4.19 MIL/uL (ref 3.87–5.11)
RDW: 12.9 % (ref 11.5–15.5)
WBC: 7.8 10*3/uL (ref 4.0–10.5)
nRBC: 0 % (ref 0.0–0.2)

## 2019-02-17 LAB — BASIC METABOLIC PANEL
Anion gap: 7 (ref 5–15)
BUN: 15 mg/dL (ref 8–23)
CO2: 28 mmol/L (ref 22–32)
Calcium: 8.7 mg/dL — ABNORMAL LOW (ref 8.9–10.3)
Chloride: 103 mmol/L (ref 98–111)
Creatinine, Ser: 0.53 mg/dL (ref 0.44–1.00)
GFR calc Af Amer: >60 mL/min (ref >60)
GFR calc non Af Amer: >60 mL/min (ref >60)
Glucose, Bld: 98 mg/dL (ref 70–99)
Potassium: 3.9 mmol/L (ref 3.5–5.1)
Sodium: 138 mmol/L (ref 135–145)

## 2019-02-17 LAB — HEPARIN LEVEL (UNFRACTIONATED): Heparin Unfractionated: 0.2 IU/mL — ABNORMAL LOW (ref 0.30–0.70)

## 2019-02-17 MED ORDER — GUAIFENESIN ER 600 MG PO TB12: 600.0000 mg | ORAL_TABLET | Freq: Two times a day (BID) | ORAL | 0 refills | Status: DC

## 2019-02-17 MED ORDER — NITROGLYCERIN 0.4 MG SL SUBL: 0.4000 mg | SUBLINGUAL_TABLET | SUBLINGUAL | 0 refills | Status: AC | PRN

## 2019-02-17 MED ORDER — CLOPIDOGREL BISULFATE 75 MG PO TABS: 81.0000 mg | ORAL_TABLET | Freq: Every day | ORAL | 0 refills | Status: AC

## 2019-02-17 MED ORDER — ISOSORBIDE MONONITRATE ER 30 MG PO TB24
30.0000 mg | ORAL_TABLET | Freq: Every day | ORAL | Status: DC
  Administered 2019-02-17: 30 mg via ORAL
  Filled 2019-02-17: qty 1

## 2019-02-17 MED ORDER — METOPROLOL SUCCINATE ER 50 MG PO TB24
25.0000 mg | ORAL_TABLET | Freq: Every day | ORAL | Status: DC
  Administered 2019-02-17: 25 mg via ORAL
  Filled 2019-02-17: qty 1

## 2019-02-17 MED ORDER — ENOXAPARIN SODIUM 40 MG/0.4ML ~~LOC~~ SOLN
40.0000 mg | SUBCUTANEOUS | Status: DC
  Administered 2019-02-17: 40 mg via SUBCUTANEOUS
  Filled 2019-02-17: qty 0.4

## 2019-02-17 MED ORDER — SIMVASTATIN 40 MG PO TABS: 40.0000 mg | ORAL_TABLET | Freq: Every day | ORAL | 0 refills | Status: DC

## 2019-02-17 NOTE — Progress Notes (Signed)
4  Discharged home with husband.

## 2019-02-17 NOTE — Progress Notes (Signed)
[facility] Cardiology  SUBJECTIVE: Patient laying in bed, denies chest pain or shortness of breath   Vitals:   02/17/19 0300 02/17/19 0400 02/17/19 0500 02/17/19 0522  BP: (!) 77/42 (!) 93/51 (!) 84/40 103/64  Pulse: 60 61 60 (!) 58  Resp: 17 19 18 17   Temp:      TempSrc:      SpO2: (!) 89% 91% 93% 92%  Weight:      Height:         Intake/Output Summary (Last 24 hours) at 02/17/2019 14/09/2018 Last data filed at 02/17/2019 0500 Gross per 24 hour  Intake 195 ml  Output -  Net 195 ml      PHYSICAL EXAM  General: Well developed, well nourished, in no acute distress HEENT:  Normocephalic and atramatic Neck:  No JVD.  Lungs: Clear bilaterally to auscultation and percussion. Heart: HRRR . Normal S1 and S2 without gallops or murmurs.  Abdomen: Bowel sounds are positive, abdomen soft and non-tender  Msk:  Back normal, normal gait. Normal strength and tone for age. Extremities: No clubbing, cyanosis or edema.   Neuro: Alert and oriented X 3. Psych:  Good affect, responds appropriately   LABS: Basic Metabolic Panel: Recent Labs    02/15/19 0849 02/17/19 0504  NA 139 138  K 4.0 3.9  CL 105 103  CO2 27 28  GLUCOSE 96 98  BUN 11 15  CREATININE 0.65 0.53  CALCIUM 8.8* 8.7*   Liver Function Tests: No results for input(s): AST, ALT, ALKPHOS, BILITOT, PROT, ALBUMIN in the last 72 hours. No results for input(s): LIPASE, AMYLASE in the last 72 hours. CBC: Recent Labs    02/16/19 0423 02/17/19 0504  WBC 7.1 7.8  HGB 12.3 12.1  HCT 37.2 37.9  MCV 87.3 90.5  PLT 190 202   Cardiac Enzymes: No results for input(s): CKTOTAL, CKMB, CKMBINDEX, TROPONINI in the last 72 hours. BNP: Invalid input(s): POCBNP D-Dimer: No results for input(s): DDIMER in the last 72 hours. Hemoglobin A1C: No results for input(s): HGBA1C in the last 72 hours. Fasting Lipid Panel: No results for input(s): CHOL, HDL, LDLCALC, TRIG, CHOLHDL, LDLDIRECT in the last 72 hours. Thyroid Function Tests: No  results for input(s): TSH, T4TOTAL, T3FREE, THYROIDAB in the last 72 hours.  Invalid input(s): FREET3 Anemia Panel: No results for input(s): VITAMINB12, FOLATE, FERRITIN, TIBC, IRON, RETICCTPCT in the last 72 hours.  No results found.   Echo LVEF 40 to 45%  TELEMETRY: Sinus rhythm:  ASSESSMENT AND PLAN:  Principal Problem:   Acute ST elevation myocardial infarction (STEMI) involving left anterior descending (LAD) coronary artery (HCC) Active Problems:   Hypertension   COPD (chronic obstructive pulmonary disease) (HCC)   Coronary artery disease   Depression   Anxiety   HLD (hyperlipidemia)    1.  Anterior ST elevation myocardial infarction 2.  Spontaneous coronary artery dissection of mid LAD with patent stents ostial/proximal/mid RCA, treated conservatively.  Patient experienced recurrent chest pain, restarted on heparin drip, now chest pain-free. 3.  Mildly reduced left ventricular function with apical wall akinesis 4.  Essential hypertension 5.  Hyperlipidemia  Recommendations  1.  Agree with overall current therapy 2.  DC heparin 3.  Continue dual antiplatelet therapy 4.  Resume home medications including metoprolol succinate 25 mg daily, isosorbide mononitrate 30 mg daily 5.  Continue simvastatin 40 mg daily 6.  Ambulate patient, if patient does well, may discharge home 7.  Follow-up with me in 1 week   14/07/20, MD,  PhD, [facility] 02/17/2019 8:21 AM

## 2019-02-17 NOTE — Discharge Instructions (Signed)
Angina ° °Angina is very bad discomfort or pain in the chest, neck, arm, jaw, or back. The discomfort is caused by a lack of blood in the middle layer of the heart wall (myocardium). °What are the causes? °This condition is caused by a buildup of fat and cholesterol (plaque) in your arteries (atherosclerosis). This buildup narrows the arteries and makes it hard for blood to flow. °What increases the risk? °You are more likely to develop this condition if: °· You have high levels of cholesterol in your blood. °· You have high blood pressure (hypertension). °· You have diabetes. °· You have a family history of heart disease. °· You are not active, or you do not exercise enough. °· You feel sad (depressed). °· You have been treated with high energy rays (radiation) on the left side of your chest. °Other risk factors are: °· Using tobacco. °· Being very overweight (obese). °· Eating a diet high in unhealthy fats (saturated fats). °· Having stress, or being exposed to things that cause stress. °· Using drugs, such as cocaine. °Women have a greater risk for angina if: °· They are older than 55. °· They have stopped having their period (are in postmenopause). °What are the signs or symptoms? °Common symptoms of this condition in both men and women may include: °· Chest pain, which may: °? Feel like a crushing or squeezing in the chest. °? Feel like a tightness, pressure, fullness, or heaviness in the chest. °? Last for more than a few minutes at a time. °? Stop and come back (recur) after a few minutes. °· Pain in the neck, arm, jaw, or back. °· Heartburn or upset stomach (indigestion) for no reason. °· Being short of breath. °· Feeling sick to your stomach (nauseous). °· Sudden cold sweats. °Women and people with diabetes may have other symptoms that are not usual, such as feeling: °· Tired (fatigue). °· Worried or nervous (anxious) for no reason. °· Weak for no reason. °· Dizzy or passing out (fainting). °How is this  treated? °This condition may be treated with: °· Medicines. These are given to: °? Prevent blood clots. °? Prevent heart attack. °? Relax blood vessels and improve blood flow to the heart (nitrates). °? Reduce blood pressure. °? Improve the pumping action of the heart. °? Reduce fat and cholesterol in the blood. °· A procedure to widen a narrowed or blocked artery in the heart (angioplasty). °· Surgery to allow blood to go around a blocked artery (coronary artery bypass surgery). °Follow these instructions at home: °Medicines °· Take over-the-counter and prescription medicines only as told by your doctor. °· Do not take these medicines unless your doctor says that you can: °? NSAIDs. These include: °§ Ibuprofen. °§ Naproxen. °? Vitamin supplements that have vitamin A, vitamin E, or both. °? Hormone therapy that contains estrogen with or without progestin. °Eating and drinking ° °· Eat a heart-healthy diet that includes: °? Lots of fresh fruits and vegetables. °? Whole grains. °? Low-fat (lean) protein. °? Low-fat dairy products. °· Follow instructions from your doctor about what you cannot eat or drink. °Activity °· Follow an exercise program that your doctor tells you. °· Talk with your doctor about joining a program to help improve the health of your heart (cardiac rehab). °· When you feel tired, take a break. Plan breaks if you know you are going to feel tired. °Lifestyle ° °· Do not use any products that contain nicotine or tobacco. This includes cigarettes, e-cigarettes, and   chewing tobacco. If you need help quitting, ask your doctor. °· If your doctor says you can drink alcohol: °? Limit how much you use to: °§ 0-1 drink a day for women who are not pregnant. °§ 0-2 drinks a day for men. °? Be aware of how much alcohol is in your drink. In the [place] one drink equals: °§ One 12 oz bottle of beer (355 mL). °§ One 5 oz glass of wine (148 mL). °§ One 1½ oz glass of hard liquor (44 mL). °General instructions °· Stay  at a healthy weight. If your doctor tells you to do so, work with him or her to lose weight. °· Learn to deal with stress. If you need help, ask your doctor. °· Keep your vaccines up to date. Get a flu shot every year. °· Talk with your doctor if you feel sad. Take a screening test to see if you are at risk for depression. °· Work with your doctor to manage any other health problems that you have. These may include diabetes or high blood pressure. °· Keep all follow-up visits as told by your doctor. This is important. °Get help right away if: °· You have pain in your chest, neck, arm, jaw, or back, and the pain: °? Lasts more than a few minutes. °? Comes back. °? Does not get better after you take medicine under your tongue (sublingual nitroglycerin). °? Keeps getting worse. °? Comes more often. °· You have any of these problems for no reason: °? Sweating a lot. °? Heartburn or upset stomach. °? Shortness of breath. °? Trouble breathing. °? Feeling sick to your stomach. °? Throwing up (vomiting). °? Feeling more tired than normal. °? Feeling nervous or worrying more than normal. °? Weakness. °· You are suddenly dizzy or light-headed. °· You pass out. °These symptoms may be an emergency. Do not wait to see if the symptoms will go away. Get medical help right away. Call your local emergency services (911 in the [place] Do not drive yourself to the hospital. °Summary °· Angina is very bad discomfort or pain in the chest, neck, arm, neck, or back. °· Symptoms include chest pain, heartburn or upset stomach for no reason, and shortness of breath. °· Women or people with diabetes may have symptoms that are not usual, such as feeling nervous or worried for no reason, weak for no reason, or tired. °· Take all medicines only as told by your doctor. °· You should eat a heart-healthy diet and follow an exercise program. °This information is not intended to replace advice given to you by your health care provider. Make sure you  discuss any questions you have with your health care provider. °Document Released: 08/16/2007 Document Revised: 10/15/2017 Document Reviewed: 10/15/2017 °Elsevier Patient Education © 2020 [employer] ° °

## 2019-02-18 NOTE — Discharge Summary (Signed)
[facility] Discharge Summary   Patient: Courtney Blanchard HKV:[id number]   PCP: Courtney Mina, MD DOB: 1955/08/26   Date of admission: 02/13/2019   Date of discharge: 02/17/2019     Discharge Diagnoses:  Principal Problem:   Acute ST elevation myocardial infarction (STEMI) involving left anterior descending (LAD) coronary artery (HCC) Active Problems:   Hypertension   COPD (chronic obstructive pulmonary disease) (HCC)   Coronary artery disease   Depression   Anxiety   HLD (hyperlipidemia)   Admitted From: home Disposition:  Home   Recommendations for Outpatient Follow-up:  1. PCP: please follow up in 1 week for blood pressure 2. Follow up LABS/TEST:  none  Follow-up Information    Courtney Mina, MD. Schedule an appointment as soon as possible for a visit in 1 week(s).   Specialty: Family Medicine Contact information: [address] [facility] [place] [address] [phone]        Courtney Millard, MD. Schedule an appointment as soon as possible for a visit in 1 week.   Specialty: Cardiology Why: APPOINTMENT SCHEDULED FOR TUES DEC 15 AT 315 WITH Courtney Blanchard Courtney Blanchard Contact information: [address] [facility] [place] [address] [phone]          Diet recommendation: Cardiac diet  Activity: The patient is advised to gradually reintroduce usual activities,as tolerated  Discharge Condition: good  Code Status: Full code   History of present illness: As per the H and P dictated on admission, "Courtney Blanchard is a 63 y.o. female with medical history significant of CAD, status post multiple stents in the distal mid and proximal RCA, hypertension, hyperlipidemia, diet-controlled diabetes, COPD, asthma, depression with anxiety, tremor, who presents with chest pain  Pt was in usual state of health until 02/12/2019 when she experienced intermittent episodes of substernal chest pain. Pt was brought to ED via  EMS. ECG in route revealed ST elevations in the anterior leads. At arrival to Emergency room, her chest pain improved with apparent resolution of ST elevation. Pt underwent urgent cardiac catheterization in the early morning. Currently, she states that she has mild substernal chest pain and mild shortness of breath.  She has intermittent mild dry cough.  No fever or chills.  Denies nausea vomiting, diarrhea, abdominal pain, symptoms of UTI or unilateral weakness."  Hospital Course:  Summary of her active problems in the hospital is as following. 1.  ST elevation MI. LAD dissection History of CAD. PVCs Essential hypertension Hyperlipidemia SP cardiac catheterization on 02/13/2019 which showed spontaneous dissection of the LAD. Repeat EKG on 02/15/2019 after complaints of chest pain shows ST elevation MI in lead I and aVL. Currently recommendation is for conservative measures with medical therapy. Patient started back on IV heparin as well as IV nitroglycerin gtt. Transferred to ICU. Cardiology recommends medical therapy for now. Continue metoprolol, continue simvastatin.  Continue dual antiplatelet. Echocardiogram shows 40 to 45% EF, no regional wall motion abnormality. Continue simvastatin Repeat EKG on 02/16/2019 personally evaluated does not show any evidence of significant ST elevation or pericarditis. Unfortunately due to soft BP unable to prescribe ARB/ACE inhibitor for now.  continue imdur and toprol  2.  Depression and anxiety Stable. Currently no active symptoms. Continue Xanax and Paxil.  3.  COPD Continue inhalers. No active exacerbation for now.  4.  Obesity Body mass index is 36.98 kg/m.  Dietary consultation.  5 poor appetite. Setting of ongoing cardiac issue. Hopefully will improve once she is out of this  acute episode.  Patient was ambulatory without any assistance. On the day of the discharge the patient's vitals were stable, and no other acute medical  condition were reported by patient. the patient was felt safe to be discharge at Home with no therapy needed on discharge.  Consultants: Cardiology  Procedures: Cardiac Catheterization  Echocardiogram   DISCHARGE MEDICATION: Allergies as of 02/17/2019      Reactions   Amoxicillin    Has patient had a PCN reaction causing immediate rash, facial/tongue/throat swelling, SOB or lightheadedness with hypotension: Yes Has patient had a PCN reaction causing severe rash involving mucus membranes or skin necrosis: No Has patient had a PCN reaction that required hospitalization: No Has patient had a PCN reaction occurring within the last 10 years: No If all of the above answers are "NO", then may proceed with Cephalosporin use. Sore throat   Atorvastatin    Tremors       Medication List    STOP taking these medications   doxycycline 100 MG capsule Commonly known as: VIBRAMYCIN     TAKE these medications   acetaminophen 500 MG tablet Commonly known as: TYLENOL Take 500-1,000 mg by mouth 2 (two) times daily as needed for moderate pain or headache.   ALPRAZolam 0.5 MG tablet Commonly known as: XANAX Take 0.5 mg by mouth 2 (two) times daily as needed for anxiety.   aspirin 81 MG tablet Take 81 mg by mouth daily.   atorvastatin 40 MG tablet Commonly known as: LIPITOR Take 1 tablet (40 mg total) by mouth daily at 6 PM.   clopidogrel 75 MG tablet Commonly known as: PLAVIX Take 1 tablet (75 mg total) by mouth daily.   DULoxetine 20 MG capsule Commonly known as: CYMBALTA Take 20 mg by mouth daily.   gabapentin 300 MG capsule Commonly known as: NEURONTIN Take 600 mg by mouth at bedtime.   guaiFENesin 600 MG 12 hr tablet Commonly known as: MUCINEX Take 1 tablet (600 mg total) by mouth 2 (two) times daily.   isosorbide mononitrate 30 MG 24 hr tablet Commonly known as: IMDUR Take 1 tablet (30 mg total) by mouth daily.   metoprolol succinate 25 MG 24 hr tablet Commonly known as:  TOPROL-XL Take 12.5 mg by mouth daily.   montelukast 10 MG tablet Commonly known as: SINGULAIR Take 10 mg by mouth daily.   nitroGLYCERIN 0.4 MG SL tablet Commonly known as: NITROSTAT Place 1 tablet (0.4 mg total) under the tongue every 5 (five) minutes as needed for chest pain.   PARoxetine 20 MG tablet Commonly known as: PAXIL Take 40 mg by mouth at bedtime.   simvastatin 40 MG tablet Commonly known as: ZOCOR Take 1 tablet (40 mg total) by mouth daily at 6 PM.      Allergies  Allergen Reactions   Amoxicillin     Has patient had a PCN reaction causing immediate rash, facial/tongue/throat swelling, SOB or lightheadedness with hypotension: Yes Has patient had a PCN reaction causing severe rash involving mucus membranes or skin necrosis: No Has patient had a PCN reaction that required hospitalization: No Has patient had a PCN reaction occurring within the last 10 years: No If all of the above answers are "NO", then may proceed with Cephalosporin use.  Sore throat    Atorvastatin     Tremors    Discharge Instructions    Diet - low sodium heart healthy   Complete by: As directed    Discharge instructions   Complete by: As  directed    It is important that you read the given instructions as well as go over your medication list with RN to help you understand your care after this hospitalization.  Please follow-up with PCP in 1-2 weeks.  Please note that NO REFILLS for any discharge medications will be authorized once you are discharged, as it is imperative that you return to your primary care physician (or establish a relationship with a primary care physician if you do not have one) for your aftercare needs so that they can reassess your need for medications and monitor your lab values.  Please request your primary care physician to go over all Hospital Tests and Procedure/Radiological results at the follow up. Please get all Hospital records sent to your PCP by signing  hospital release before you go home.    Do not take more than prescribed Pain, Sleep and Anxiety Medications.  You were cared for by a hospitalist during your hospital stay. If you have any questions about your discharge medications or the care you received while you were in the hospital after you are discharged, you can call the unit @ you were admitted to and ask to speak with the hospitalist Lynden Oxford. Ask for Hospitalist on call if the hospitalist that took care of you is not available.   Once you are discharged, your primary care physician will handle any further medical issues.  You Must read complete instructions/literature along with all the possible adverse reactions/side effects for all the Medicines you take and that have been prescribed to you. Take any new Medicines after you have completely understood and accept all the possible adverse reactions/side effects.  If you have smoked or chewed Tobacco in the last 2 yrs please STOP smoking If you drink alcohol, please safely reduce the use. Do not drive, operating heavy machinery, perform activities at heights, swimming or participation in water activities or provide baby sitting services under influence.  Wear Seat belts while driving.   Increase activity slowly   Complete by: As directed      Discharge Exam: Filed Weights   02/13/19 0600 02/14/19 1226 02/15/19 1300  Weight: 94.3 kg 91.9 kg 91.7 kg   Vitals:   02/17/19 0900 02/17/19 1033  BP: (!) 99/57   Pulse: 61 (!) 51  Resp: (!) 21   Temp:    SpO2: 95%    General: Appear in no distress, no Rash; Oral Mucosa Clear, moist. no Abnormal Mass Or lumps Cardiovascular: S1 and S2 Present, no Murmur, Respiratory: normal respiratory effort, Bilateral Air entry present and Clear to Auscultation, no Crackles, no wheezes Abdomen: Bowel Sound present, Soft and no tenderness, no hernia Extremities: no Pedal edema, no calf tenderness Neurology: alert and oriented to time,  place, and person affect appropriate.  The results of significant diagnostics from this hospitalization (including imaging, microbiology, ancillary and laboratory) are listed below for reference.    Significant Diagnostic Studies: No results found.  Microbiology: Recent Results (from the past 240 hour(s))  MRSA PCR Screening     Status: None   Collection Time: 02/13/19  6:30 AM   Specimen: Nasal Mucosa; Nasopharyngeal  Result Value Ref Range Status   MRSA by PCR NEGATIVE NEGATIVE Final    Comment:        The GeneXpert MRSA Assay (FDA approved for NASAL specimens only), is one component of a comprehensive MRSA colonization surveillance program. It is not intended to diagnose MRSA infection nor to guide or monitor treatment for MRSA  infections. Performed at [facility], [address] [place], [place] [address]   SARS Coronavirus 2 by RT PCR (hospital order, performed in [facility] hospital lab) Nasopharyngeal Nasopharyngeal Swab     Status: None   Collection Time: 02/13/19  6:30 AM   Specimen: Nasopharyngeal Swab  Result Value Ref Range Status   SARS Coronavirus 2 NEGATIVE NEGATIVE Final    Comment: (NOTE) SARS-CoV-2 target nucleic acids are NOT DETECTED. The SARS-CoV-2 RNA is generally detectable in upper and lower respiratory specimens during the acute phase of infection. The lowest concentration of SARS-CoV-2 viral copies this assay can detect is 250 copies / mL. A negative result does not preclude SARS-CoV-2 infection and should not be used as the sole basis for treatment or other patient management decisions.  A negative result may occur with improper specimen collection / handling, submission of specimen other than nasopharyngeal swab, presence of viral mutation(s) within the areas targeted by this assay, and inadequate number of viral copies (<250 copies / mL). A negative result must be combined with clinical observations, patient history, and  epidemiological information. Fact Sheet for Patients:   BoilerBrush.com.cyhttps://www.fda.gov/media/136312/download Fact Sheet for Healthcare Providers: https://pope.com/https://www.fda.gov/media/136313/download This test is not yet approved or cleared  by the [place] FDA and has been authorized for detection and/or diagnosis of SARS-CoV-2 by FDA under an Emergency Use Authorization (EUA).  This EUA will remain in effect (meaning this test can be used) for the duration of the COVID-19 declaration under Section 564(b)(1) of the Act, 21 U.S.C. section 360bbb-3(b)(1), unless the authorization is terminated or revoked sooner. Performed at [facility], [address] [place], [place] [address]      Labs: CBC: Recent Labs  Lab 02/13/19 [phone] 02/15/19 0849 02/16/19 0423 02/17/19 0504  WBC 4.5 4.7 7.1 7.8  NEUTROABS 1.4*  --   --   --   HGB 11.9* 11.5* 12.3 12.1  HCT 36.9 34.8* 37.2 37.9  MCV 89.1 87.9 87.3 90.5  PLT 199 167 190 202   Basic Metabolic Panel: Recent Labs  Lab 02/13/19 0415 02/15/19 0849 02/17/19 0504  NA 140 139 138  K 3.5 4.0 3.9  CL 107 105 103  CO2 24 27 28   GLUCOSE 97 96 98  BUN 15 11 15   CREATININE 0.64 0.65 0.53  CALCIUM 8.8* 8.8* 8.7*   Liver Function Tests: Recent Labs  Lab 02/13/19 0415  AST 25  ALT 20  ALKPHOS 86  BILITOT 0.6  PROT 6.5  ALBUMIN 3.7   No results for input(s): LIPASE, AMYLASE in the last 168 hours. No results for input(s): AMMONIA in the last 168 hours. Cardiac Enzymes: No results for input(s): CKTOTAL, CKMB, CKMBINDEX, TROPONINI in the last 168 hours. BNP (last 3 results) No results for input(s): BNP in the last 8760 hours. CBG: Recent Labs  Lab 02/13/19 0619 02/15/19 1246  GLUCAP 86 97    Time spent: 35 minutes  Signed:  Lynden OxfordPranav Nnenna Meador  [facility] 02/17/2019 8:34 AM

## 2019-02-19 ENCOUNTER — Inpatient Hospital Stay (HOSPITAL_COMMUNITY)
Admission: EM | Admit: 2019-02-19 | Discharge: 2019-02-21 | Disposition: A | Discharge status: Home / Self Care | Payer: Medicare HMO | Source: Home / Self Care | Attending: Internal Medicine | Admitting: Internal Medicine

## 2019-02-19 ENCOUNTER — Emergency Department: Payer: Medicare HMO

## 2019-02-19 ENCOUNTER — Encounter: Payer: Self-pay | Admitting: Emergency Medicine

## 2019-02-19 ENCOUNTER — Other Ambulatory Visit: Payer: Self-pay

## 2019-02-19 DIAGNOSIS — Z8249 Family history of ischemic heart disease and other diseases of the circulatory system: Secondary | ICD-10-CM

## 2019-02-19 DIAGNOSIS — Z823 Family history of stroke: Secondary | ICD-10-CM

## 2019-02-19 DIAGNOSIS — I25119 Atherosclerotic heart disease of native coronary artery with unspecified angina pectoris: Secondary | ICD-10-CM | POA: Diagnosis not present

## 2019-02-19 DIAGNOSIS — G629 Polyneuropathy, unspecified: Secondary | ICD-10-CM | POA: Diagnosis not present

## 2019-02-19 DIAGNOSIS — I200 Unstable angina: Secondary | ICD-10-CM | POA: Diagnosis not present

## 2019-02-19 DIAGNOSIS — E785 Hyperlipidemia, unspecified: Secondary | ICD-10-CM | POA: Diagnosis not present

## 2019-02-19 DIAGNOSIS — R079 Chest pain, unspecified: Secondary | ICD-10-CM | POA: Diagnosis not present

## 2019-02-19 DIAGNOSIS — R7989 Other specified abnormal findings of blood chemistry: Secondary | ICD-10-CM

## 2019-02-19 DIAGNOSIS — I2542 Coronary artery dissection: Secondary | ICD-10-CM

## 2019-02-19 DIAGNOSIS — Z7902 Long term (current) use of antithrombotics/antiplatelets: Secondary | ICD-10-CM | POA: Diagnosis not present

## 2019-02-19 DIAGNOSIS — E1140 Type 2 diabetes mellitus with diabetic neuropathy, unspecified: Secondary | ICD-10-CM | POA: Diagnosis present

## 2019-02-19 DIAGNOSIS — I10 Essential (primary) hypertension: Secondary | ICD-10-CM | POA: Diagnosis present

## 2019-02-19 DIAGNOSIS — R5383 Other fatigue: Secondary | ICD-10-CM | POA: Diagnosis not present

## 2019-02-19 DIAGNOSIS — I252 Old myocardial infarction: Secondary | ICD-10-CM | POA: Diagnosis not present

## 2019-02-19 DIAGNOSIS — R0602 Shortness of breath: Secondary | ICD-10-CM | POA: Diagnosis not present

## 2019-02-19 DIAGNOSIS — Z880 Allergy status to penicillin: Secondary | ICD-10-CM

## 2019-02-19 DIAGNOSIS — Z955 Presence of coronary angioplasty implant and graft: Secondary | ICD-10-CM

## 2019-02-19 DIAGNOSIS — Z79899 Other long term (current) drug therapy: Secondary | ICD-10-CM

## 2019-02-19 DIAGNOSIS — Z888 Allergy status to other drugs, medicaments and biological substances status: Secondary | ICD-10-CM

## 2019-02-19 DIAGNOSIS — I25110 Atherosclerotic heart disease of native coronary artery with unstable angina pectoris: Secondary | ICD-10-CM | POA: Diagnosis present

## 2019-02-19 DIAGNOSIS — J449 Chronic obstructive pulmonary disease, unspecified: Secondary | ICD-10-CM

## 2019-02-19 DIAGNOSIS — Z20828 Contact with and (suspected) exposure to other viral communicable diseases: Secondary | ICD-10-CM | POA: Diagnosis not present

## 2019-02-19 DIAGNOSIS — R778 Other specified abnormalities of plasma proteins: Secondary | ICD-10-CM

## 2019-02-19 DIAGNOSIS — F419 Anxiety disorder, unspecified: Secondary | ICD-10-CM | POA: Diagnosis not present

## 2019-02-19 DIAGNOSIS — F329 Major depressive disorder, single episode, unspecified: Secondary | ICD-10-CM | POA: Diagnosis present

## 2019-02-19 DIAGNOSIS — Z7982 Long term (current) use of aspirin: Secondary | ICD-10-CM

## 2019-02-19 LAB — COMPREHENSIVE METABOLIC PANEL
ALT: 22 U/L (ref 0–44)
AST: 27 U/L (ref 15–41)
Albumin: 3.8 g/dL (ref 3.5–5.0)
Alkaline Phosphatase: 100 U/L (ref 38–126)
Anion gap: 9 (ref 5–15)
BUN: 12 mg/dL (ref 8–23)
CO2: 26 mmol/L (ref 22–32)
Calcium: 8.7 mg/dL — ABNORMAL LOW (ref 8.9–10.3)
Chloride: 102 mmol/L (ref 98–111)
Creatinine, Ser: 0.65 mg/dL (ref 0.44–1.00)
GFR calc Af Amer: >60 mL/min (ref >60)
GFR calc non Af Amer: >60 mL/min (ref >60)
Glucose, Bld: 106 mg/dL — ABNORMAL HIGH (ref 70–99)
Potassium: 3.9 mmol/L (ref 3.5–5.1)
Sodium: 137 mmol/L (ref 135–145)
Total Bilirubin: 0.8 mg/dL (ref 0.3–1.2)
Total Protein: 6.7 g/dL (ref 6.5–8.1)

## 2019-02-19 LAB — HEPARIN LEVEL (UNFRACTIONATED)
Heparin Unfractionated: 0.19 IU/mL — ABNORMAL LOW (ref 0.30–0.70)
Heparin Unfractionated: 0.3 IU/mL (ref 0.30–0.70)

## 2019-02-19 LAB — APTT: aPTT: 35 seconds (ref 24–36)

## 2019-02-19 LAB — CBC
HCT: 36.9 % (ref 36.0–46.0)
Hemoglobin: 12.2 g/dL (ref 12.0–15.0)
MCH: 29 pg (ref 26.0–34.0)
MCHC: 33.1 g/dL (ref 30.0–36.0)
MCV: 87.9 fL (ref 80.0–100.0)
Platelets: 221 10*3/uL (ref 150–400)
RBC: 4.2 MIL/uL (ref 3.87–5.11)
RDW: 13.1 % (ref 11.5–15.5)
WBC: 5.5 10*3/uL (ref 4.0–10.5)
nRBC: 0 % (ref 0.0–0.2)

## 2019-02-19 LAB — PROTIME-INR
INR: 0.9 (ref 0.8–1.2)
Prothrombin Time: 12 seconds (ref 11.4–15.2)

## 2019-02-19 LAB — SARS CORONAVIRUS 2 (TAT 6-24 HRS): SARS Coronavirus 2: NEGATIVE

## 2019-02-19 LAB — TROPONIN I (HIGH SENSITIVITY)
Troponin I (High Sensitivity): 2199 ng/L (ref <18)
Troponin I (High Sensitivity): 1782 ng/L (ref <18)

## 2019-02-19 MED ORDER — ACETAMINOPHEN 325 MG PO TABS
650.0000 mg | ORAL_TABLET | Freq: Four times a day (QID) | ORAL | Status: DC | PRN
  Administered 2019-02-19 – 2019-02-20 (×2): 650 mg via ORAL
  Filled 2019-02-19: qty 2

## 2019-02-19 MED ORDER — ISOSORBIDE MONONITRATE ER 60 MG PO TB24
60.0000 mg | ORAL_TABLET | Freq: Every day | ORAL | Status: DC
  Administered 2019-02-20 – 2019-02-21 (×2): 60 mg via ORAL
  Filled 2019-02-19 (×3): qty 1

## 2019-02-19 MED ORDER — HEPARIN SODIUM (PORCINE) 5000 UNIT/ML IJ SOLN
4000.0000 [IU] | Freq: Once | INTRAMUSCULAR | Status: DC
  Filled 2019-02-19: qty 1

## 2019-02-19 MED ORDER — HEPARIN BOLUS VIA INFUSION
4000.0000 [IU] | Freq: Once | INTRAVENOUS | Status: AC
  Administered 2019-02-19: 4000 [IU] via INTRAVENOUS
  Filled 2019-02-19: qty 4000

## 2019-02-19 MED ORDER — ISOSORBIDE MONONITRATE ER 60 MG PO TB24
30.0000 mg | ORAL_TABLET | Freq: Every day | ORAL | Status: DC
  Administered 2019-02-19: 30 mg via ORAL
  Filled 2019-02-19: qty 1

## 2019-02-19 MED ORDER — SIMVASTATIN 20 MG PO TABS
40.0000 mg | ORAL_TABLET | Freq: Every day | ORAL | Status: DC
  Filled 2019-02-19: qty 1

## 2019-02-19 MED ORDER — DULOXETINE HCL 20 MG PO CPEP
20.0000 mg | ORAL_CAPSULE | Freq: Every day | ORAL | Status: DC
  Administered 2019-02-19 – 2019-02-21 (×3): 20 mg via ORAL
  Filled 2019-02-19 (×3): qty 1

## 2019-02-19 MED ORDER — SODIUM CHLORIDE 0.9% FLUSH
3.0000 mL | Freq: Once | INTRAVENOUS | Status: AC
  Administered 2019-02-19: 3 mL via INTRAVENOUS

## 2019-02-19 MED ORDER — HEPARIN BOLUS VIA INFUSION
2000.0000 [IU] | Freq: Once | INTRAVENOUS | Status: AC
  Administered 2019-02-19: 2000 [IU] via INTRAVENOUS
  Filled 2019-02-19: qty 2000

## 2019-02-19 MED ORDER — HEPARIN (PORCINE) 25000 UT/250ML-% IV SOLN: 14.0000 [IU]/kg/h | INTRAVENOUS | Status: DC

## 2019-02-19 MED ORDER — MORPHINE SULFATE (PF) 4 MG/ML IV SOLN
4.0000 mg | INTRAVENOUS | Status: DC | PRN
  Administered 2019-02-19 – 2019-02-20 (×3): 4 mg via INTRAVENOUS
  Filled 2019-02-19 (×3): qty 1

## 2019-02-19 MED ORDER — MONTELUKAST SODIUM 10 MG PO TABS
10.0000 mg | ORAL_TABLET | Freq: Every day | ORAL | Status: DC
  Administered 2019-02-19 – 2019-02-21 (×3): 10 mg via ORAL
  Filled 2019-02-19 (×3): qty 1

## 2019-02-19 MED ORDER — NITROGLYCERIN 0.4 MG SL SUBL
0.4000 mg | SUBLINGUAL_TABLET | SUBLINGUAL | Status: DC | PRN
  Administered 2019-02-19 (×2): 0.4 mg via SUBLINGUAL
  Filled 2019-02-19: qty 1

## 2019-02-19 MED ORDER — HEPARIN (PORCINE) 25000 UT/250ML-% IV SOLN
1050.0000 [IU]/h | INTRAVENOUS | Status: DC
  Administered 2019-02-19: 850 [IU]/h via INTRAVENOUS
  Administered 2019-02-20 – 2019-02-21 (×2): 1050 [IU]/h via INTRAVENOUS
  Filled 2019-02-19 (×4): qty 250

## 2019-02-19 MED ORDER — GUAIFENESIN ER 600 MG PO TB12
600.0000 mg | ORAL_TABLET | Freq: Two times a day (BID) | ORAL | Status: DC
  Administered 2019-02-19 – 2019-02-21 (×5): 600 mg via ORAL
  Filled 2019-02-19 (×6): qty 1

## 2019-02-19 MED ORDER — ACETAMINOPHEN 325 MG PO TABS
ORAL_TABLET | ORAL | Status: AC
  Filled 2019-02-19: qty 2

## 2019-02-19 MED ORDER — ONDANSETRON HCL 4 MG/2ML IJ SOLN
4.0000 mg | Freq: Once | INTRAMUSCULAR | Status: AC
  Administered 2019-02-19: 4 mg via INTRAVENOUS
  Filled 2019-02-19: qty 2

## 2019-02-19 MED ORDER — CLOPIDOGREL BISULFATE 75 MG PO TABS
81.0000 mg | ORAL_TABLET | Freq: Every day | ORAL | Status: DC
  Administered 2019-02-20 – 2019-02-21 (×2): 75 mg via ORAL
  Filled 2019-02-19 (×2): qty 1

## 2019-02-19 MED ORDER — MORPHINE SULFATE (PF) 4 MG/ML IV SOLN
4.0000 mg | Freq: Once | INTRAVENOUS | Status: AC
  Administered 2019-02-19: 4 mg via INTRAVENOUS
  Filled 2019-02-19: qty 1

## 2019-02-19 MED ORDER — ASPIRIN EC 81 MG PO TBEC
81.0000 mg | DELAYED_RELEASE_TABLET | Freq: Every day | ORAL | Status: DC
  Administered 2019-02-19 – 2019-02-21 (×3): 81 mg via ORAL
  Filled 2019-02-19 (×3): qty 1

## 2019-02-19 MED ORDER — GABAPENTIN 300 MG PO CAPS
600.0000 mg | ORAL_CAPSULE | Freq: Every day | ORAL | Status: DC
  Administered 2019-02-19 – 2019-02-20 (×2): 600 mg via ORAL
  Filled 2019-02-19 (×3): qty 2

## 2019-02-19 MED ORDER — ALPRAZOLAM 0.5 MG PO TABS
0.5000 mg | ORAL_TABLET | Freq: Two times a day (BID) | ORAL | Status: DC | PRN
  Administered 2019-02-19 – 2019-02-20 (×3): 0.5 mg via ORAL
  Filled 2019-02-19 (×3): qty 1

## 2019-02-19 MED ORDER — ASPIRIN 81 MG PO CHEW
324.0000 mg | CHEWABLE_TABLET | Freq: Once | ORAL | Status: AC
  Administered 2019-02-19: 324 mg via ORAL
  Filled 2019-02-19: qty 4

## 2019-02-19 NOTE — ED Notes (Signed)
Heparin verified by Stephen, RN 

## 2019-02-19 NOTE — H&P (Signed)
History and Physical    Courtney Blanchard OMV:[id number] DOB: December 01, 1955 DOA: 02/19/2019  I have briefly reviewed the patient's prior medical records in [place]  PCP: Maryland Pink, MD  Patient coming from: home  Chief Complaint: Chest pain  HPI: Courtney Blanchard is a 63 y.o. female with medical history significant of COPD, type 2 diabetes mellitus, hypertension, coronary artery disease recently admitted and discharged 2 days ago for a STEMI presents to the hospital with recurrent chest pain.  Patient was awakened from her sleep around 2 AM this morning with sudden onset pain/tightness in her chest radiating into her bilateral shoulders as well as her back.  This reminds her of her recent STEMI for which she was admitted last week.  During that admission she underwent the cardiac catheterization on 12/3 with previous stents are patent (mid, proximal RCA, ost RCA to prox RCA) and mid LAD to distal LAD lesion which is 70% stenosed.  She was also found to have one-vessel coronary artery disease with spontaneous coronary dissection mid to distal LAD.  Echo showed mildly reduced left ventricular function with apical dyskinesis (EF 40-45%).  Recommendations were for medical therapy.  Patient took 3 nitroglycerin at home with improvement in her chest pain however because he was still persistent and not went away completely decided to come back to the emergency room.  She denies any fever or chills.  He denies any abdominal pain, nausea or vomiting.  ED Course: In the emergency room she is afebrile, other vitals are stable and she is satting well on room air.  EKG shows sinus rhythm.  Labs show troponin of 2199 which is better than 08-6998 range when she left the hospital last week.  She was placed on heparin infusion, SARS-CoV-2 is pending, cardiology was consulted and we are asked to admit.  Review of Systems: All systems reviewed, and apart from HPI, all negative  Past Medical History:   Diagnosis Date  . Anxiety   . Asthma   . Bronchitis   . Chronic cough   . Complication of anesthesia    nausea and vomiting  . COPD (chronic obstructive pulmonary disease) ([place])   . Coronary artery disease   . Depression   . Diabetes mellitus without complication ([place])   . Diverticulosis   . Dyspnea   . Dysrhythmia   . Environmental and seasonal allergies   . Hypertension   . Lower extremity edema   . Tremors of nervous system   . Wheezing     Past Surgical History:  Procedure Laterality Date  . ABDOMINAL HYSTERECTOMY    . BARIATRIC SURGERY    . CARDIAC CATHETERIZATION     with stent  . CHOLECYSTECTOMY    . COLON SURGERY    . LEFT HEART CATH AND CORONARY ANGIOGRAPHY N/A 01/28/2018   Procedure: LEFT HEART CATH AND CORONARY ANGIOGRAPHY;  Surgeon: Teodoro Spray, MD;  Location: [facility];  Service: Cardiovascular;  Laterality: N/A;  . LEFT HEART CATH AND CORONARY ANGIOGRAPHY N/A 02/13/2019   Procedure: LEFT HEART CATH AND CORONARY ANGIOGRAPHY;  Surgeon: Isaias Cowman, MD;  Location: [facility];  Service: Cardiovascular;  Laterality: N/A;     reports that she has never smoked. She has never used smokeless tobacco. She reports that she does not drink alcohol or use drugs.  Allergies  Allergen Reactions  . Amoxicillin     Has patient had a PCN reaction causing immediate rash, facial/tongue/throat swelling, SOB or lightheadedness  with hypotension: Yes Has patient had a PCN reaction causing severe rash involving mucus membranes or skin necrosis: No Has patient had a PCN reaction that required hospitalization: No Has patient had a PCN reaction occurring within the last 10 years: No If all of the above answers are "NO", then may proceed with Cephalosporin use.  Sore throat   . Atorvastatin     Tremors     Family History  Problem Relation Age of Onset  . Hypertension Mother   . Heart disease Father   . CAD Father   . Heart disease Sister   .  Stroke Sister     Prior to Admission medications   Medication Sig Start Date End Date Taking? Authorizing Provider  acetaminophen (TYLENOL) 500 MG tablet Take 500-1,000 mg by mouth 2 (two) times daily as needed for moderate pain or headache.    Yes [provider]  ALPRAZolam (XANAX XR) 1 MG 24 hr tablet Take 1 mg by mouth daily. 02/11/19  Yes [provider]  aspirin 81 MG tablet Take 81 mg by mouth daily.    Yes [provider]  clopidogrel (PLAVIX) 75 MG tablet Take 1 tablet (75 mg total) by mouth daily. 02/17/19  Yes Rolly SalterPatel, Pranav M, MD  DULoxetine (CYMBALTA) 20 MG capsule Take 20 mg by mouth daily. 02/04/19  Yes [provider]  gabapentin (NEURONTIN) 300 MG capsule Take 600 mg by mouth at bedtime.    Yes [provider]  guaiFENesin (MUCINEX) 600 MG 12 hr tablet Take 1 tablet (600 mg total) by mouth 2 (two) times daily. 02/17/19  Yes Rolly SalterPatel, Pranav M, MD  isosorbide mononitrate (IMDUR) 30 MG 24 hr tablet Take 1 tablet (30 mg total) by mouth daily. 01/29/18 02/19/19 Yes Pyreddy, Vivien RotaPavan, MD  metoprolol succinate (TOPROL-XL) 25 MG 24 hr tablet Take 12.5 mg by mouth daily.    Yes [provider]  montelukast (SINGULAIR) 10 MG tablet Take 10 mg by mouth daily.    Yes [provider]  nitroGLYCERIN (NITROSTAT) 0.4 MG SL tablet Place 1 tablet (0.4 mg total) under the tongue every 5 (five) minutes as needed for chest pain. 02/17/19  Yes Rolly SalterPatel, Pranav M, MD  simvastatin (ZOCOR) 40 MG tablet Take 1 tablet (40 mg total) by mouth daily at 6 PM. 02/17/19  Yes Rolly SalterPatel, Pranav M, MD    Physical Exam: Vitals:   02/19/19 0615 02/19/19 0630 02/19/19 0700 02/19/19 0709  BP:  119/78 122/84   Pulse: 62 60 (!) 56   Resp: 14 14 16    Temp:    97.7 F (36.5 C)  TempSrc:    Oral  SpO2: 96% 95% 96%   Weight:      Height:        Constitutional: NAD, calm, comfortable Eyes: PERRL, lids and conjunctivae normal ENMT: Mucous membranes are moist.  Neck:  normal, supple Respiratory: clear to auscultation bilaterally, no wheezing, no crackles. Normal respiratory effort.  Cardiovascular: Regular rate and rhythm, no murmurs / rubs / gallops. No extremity edema. 2+ pedal pulses.  Abdomen: no tenderness, no masses palpated.  Musculoskeletal: no clubbing / cyanosis. Normal muscle tone.  Skin: no rashes Neurologic: CN 2-12 grossly intact. Strength 5/5 in all 4.  Psychiatric: Normal judgment and insight. Alert and oriented x 3. Normal mood.   Labs on Admission: I have personally reviewed following labs and imaging studies  CBC: Recent Labs  Lab 02/13/19 0415 02/15/19 0849 02/16/19 0423 02/17/19 0504 02/19/19 0515  WBC 4.5  4.7 7.1 7.8 5.5  NEUTROABS 1.4*  --   --   --   --   HGB 11.9* 11.5* 12.3 12.1 12.2  HCT 36.9 34.8* 37.2 37.9 36.9  MCV 89.1 87.9 87.3 90.5 87.9  PLT 199 167 190 202 221   Basic Metabolic Panel: Recent Labs  Lab 02/13/19 0415 02/15/19 0849 02/17/19 0504 02/19/19 0515  NA 140 139 138 137  K 3.5 4.0 3.9 3.9  CL 107 105 103 102  CO2 24 27 28 26   GLUCOSE 97 96 98 106*  BUN 15 11 15 12   CREATININE 0.64 0.65 0.53 0.65  CALCIUM 8.8* 8.8* 8.7* 8.7*   Liver Function Tests: Recent Labs  Lab 02/13/19 0415 02/19/19 0515  AST 25 27  ALT 20 22  ALKPHOS 86 100  BILITOT 0.6 0.8  PROT 6.5 6.7  ALBUMIN 3.7 3.8   Coagulation Profile: Recent Labs  Lab 02/13/19 0415 02/15/19 1030 02/19/19 0515  INR 1.0 0.9 0.9   BNP (last 3 results) No results for input(s): PROBNP in the last 8760 hours. CBG: Recent Labs  Lab 02/13/19 0619 02/15/19 1246  GLUCAP 86 97   Thyroid Function Tests: No results for input(s): TSH, T4TOTAL, FREET4, T3FREE, THYROIDAB in the last 72 hours. Urine analysis: No results found for: COLORURINE, APPEARANCEUR, LABSPEC, PHURINE, GLUCOSEU, HGBUR, BILIRUBINUR, KETONESUR, PROTEINUR, UROBILINOGEN, NITRITE, LEUKOCYTESUR   Radiological Exams on Admission: Dg Chest Port 1 View  Result Date:  02/19/2019 CLINICAL DATA:  Chest pain. Shortness of breath. Recent cardiac catheterization. EXAM: PORTABLE CHEST 1 VIEW COMPARISON:  01/26/2018 FINDINGS: The cardiomediastinal contours are normal. Subsegmental atelectasis at the left lung base. Pulmonary vasculature is normal. No consolidation, pleural effusion, or pneumothorax. No acute osseous abnormalities are seen. IMPRESSION: Subsegmental atelectasis at the left lung base. Electronically Signed   By: 14/11/2018 M.D.   On: 02/19/2019 05:37    EKG: Independently reviewed.  Sinus rhythm  Assessment/Plan  Principal Problem Non-STEMI in the setting of underlying coronary artery disease - patient's chest pain significantly improved on my evaluation, she was started on heparin infusion -Dr. Narda Rutherford consulted, he saw her last week as well -Keep n.p.o. until evaluated by cardiology  -continue dual antiplatelet agents, continue Imdur  Active Problems Hypertension -On Imdur, hold metoprolol until evaluated by cardiology  Depression -Continue Cymbalta  COPD -Stable, no wheezing  Hyperlipidemia -Continue statin   DVT prophylaxis: Heparin infusion Code Status: Full code Family Communication: No family at bedside Disposition Plan: Home when ready Bed Type: Telemetry Consults called: Cardiology Obs/Inp: Obs  14/11/2018, MD, PhD [facility]  Contact via [email or web address]  02/19/2019, 7:41 AM

## 2019-02-19 NOTE — Consult Note (Signed)
[facility] Cardiology  CARDIOLOGY CONSULT NOTE  Patient ID: RANDE DARIO MRN: [id number] DOB/AGE: 61-Nov-1957 61 y.o.  Admit date: 02/19/2019 Referring Physician Cruzita Lederer Primary Physician [facility] Primary Cardiologist Sherion Dooly Reason for Consultation chest pain  HPI: 63 year old female referred for evaluation of chest pain.  Patient was awakened in her sleep, with right-sided chest discomfort, radiating to her right shoulder.  He [facility] ED where ECG was nondiagnostic, with sinus rhythm at 68 bpm, with less than 1 mm ST elevation in leads I and aVL.  Initial troponin was 2199, with follow-up 1782.  Most recent troponin was 6987 on 02/15/2019.  Patient recently admitted 02/13/2019 with anterior ST elevation.  Cardiac catheterization revealed multiple patent stents proximal, mid and distal RCA, with evidence of spontaneous coronary artery dissection and mid LAD.  Patient was treated conservatively, started on dual antiplatelet therapy.  2D echocardiogram 02/13/2019 revealed mild reduced left ventricular function, with LVEF 40 to 45% with apical hypokinesis.  Review of systems complete and found to be negative unless listed above     Past Medical History:  Diagnosis Date  . Anxiety   . Asthma   . Bronchitis   . Chronic cough   . Complication of anesthesia    nausea and vomiting  . COPD (chronic obstructive pulmonary disease) ([place])   . Coronary artery disease   . Depression   . Diabetes mellitus without complication ([place])   . Diverticulosis   . Dyspnea   . Dysrhythmia   . Environmental and seasonal allergies   . Hypertension   . Lower extremity edema   . Tremors of nervous system   . Wheezing     Past Surgical History:  Procedure Laterality Date  . ABDOMINAL HYSTERECTOMY    . BARIATRIC SURGERY    . CARDIAC CATHETERIZATION     with stent  . CHOLECYSTECTOMY    . COLON SURGERY    . LEFT HEART CATH AND CORONARY ANGIOGRAPHY N/A 01/28/2018   Procedure: LEFT HEART CATH AND  CORONARY ANGIOGRAPHY;  Surgeon: Teodoro Spray, MD;  Location: [facility];  Service: Cardiovascular;  Laterality: N/A;  . LEFT HEART CATH AND CORONARY ANGIOGRAPHY N/A 02/13/2019   Procedure: LEFT HEART CATH AND CORONARY ANGIOGRAPHY;  Surgeon: Isaias Cowman, MD;  Location: [facility];  Service: Cardiovascular;  Laterality: N/A;    (Not in a hospital admission)  Social History   Socioeconomic History  . Marital status: Married    Spouse name: Not on file  . Number of children: Not on file  . Years of education: Not on file  . Highest education level: Not on file  Occupational History  . Not on file  Social Needs  . Financial resource strain: Not on file  . Food insecurity    Worry: Not on file    Inability: Not on file  . Transportation needs    Medical: Not on file    Non-medical: Not on file  Tobacco Use  . Smoking status: Never Smoker  . Smokeless tobacco: Never Used  Substance and Sexual Activity  . Alcohol use: No  . Drug use: No  . Sexual activity: Not Currently  Lifestyle  . Physical activity    Days per week: Not on file    Minutes per session: Not on file  . Stress: Not on file  Relationships  . Social Herbalist on phone: Not on file    Gets together: Not on file    Attends  religious service: Not on file    Active member of club or organization: Not on file    Attends meetings of clubs or organizations: Not on file    Relationship status: Not on file  . Intimate partner violence    Fear of current or ex partner: Not on file    Emotionally abused: Not on file    Physically abused: Not on file    Forced sexual activity: Not on file  Other Topics Concern  . Not on file  Social History Narrative  . Not on file    Family History  Problem Relation Age of Onset  . Hypertension Mother   . Heart disease Father   . CAD Father   . Heart disease Sister   . Stroke Sister       Review of systems complete and found to be  negative unless listed above      PHYSICAL EXAM  General: Well developed, well nourished, in no acute distress HEENT:  Normocephalic and atramatic Neck:  No JVD.  Lungs: Clear bilaterally to auscultation and percussion. Heart: HRRR . Normal S1 and S2 without gallops or murmurs.  Abdomen: Bowel sounds are positive, abdomen soft and non-tender  Msk:  Back normal, normal gait. Normal strength and tone for age. Extremities: No clubbing, cyanosis or edema.   Neuro: Alert and oriented X 3. Psych:  Good affect, responds appropriately  Labs:   Lab Results  Component Value Date   WBC 5.5 02/19/2019   HGB 12.2 02/19/2019   HCT 36.9 02/19/2019   MCV 87.9 02/19/2019   PLT 221 02/19/2019    Recent Labs  Lab 02/19/19 0515  NA 137  K 3.9  CL 102  CO2 26  BUN 12  CREATININE 0.65  CALCIUM 8.7*  PROT 6.7  BILITOT 0.8  ALKPHOS 100  ALT 22  AST 27  GLUCOSE 106*   Lab Results  Component Value Date   TROPONINI 1.09 (HH) 01/26/2018    Lab Results  Component Value Date   CHOL 244 (H) 02/13/2019   Lab Results  Component Value Date   HDL 50 02/13/2019   Lab Results  Component Value Date   LDLCALC 172 (H) 02/13/2019   Lab Results  Component Value Date   TRIG 112 02/13/2019   Lab Results  Component Value Date   CHOLHDL 4.9 02/13/2019   No results found for: LDLDIRECT    Radiology: Dg Chest Port 1 View  Result Date: 02/19/2019 CLINICAL DATA:  Chest pain. Shortness of breath. Recent cardiac catheterization. EXAM: PORTABLE CHEST 1 VIEW COMPARISON:  01/26/2018 FINDINGS: The cardiomediastinal contours are normal. Subsegmental atelectasis at the left lung base. Pulmonary vasculature is normal. No consolidation, pleural effusion, or pneumothorax. No acute osseous abnormalities are seen. IMPRESSION: Subsegmental atelectasis at the left lung base. Electronically Signed   By: Narda Rutherford M.D.   On: 02/19/2019 05:37    EKG: Sinus rhythm with less than 1 mm ST elevation in  leads I and aVL  ASSESSMENT AND PLAN:   1.  Chest pain, with typical and atypical features, nondiagnostic ECG, with elevated troponin, trending down 2.  Status post anterior STEMI 02/13/2019 with spontaneous coronary artery dissection mid LAD 3.  Mildly reduced left ventricular function, with LVEF 40 to 45%  Recommendations  1.  Continue medical therapy 2.  Continue heparin drip 3.  Continue dual antiplatelet therapy 4.  Uptitrate isosorbide mononitrate to 60 mg daily  Signed: Marcina Millard MD,PhD, [facility] 02/19/2019, 10:08 AM

## 2019-02-19 NOTE — ED Triage Notes (Signed)
Pt assisted out of vehicle into w/c with no distress noted, mask in place; pt dx with STEMI 12/3, had card cath and d/c 12/7; st began having upper CP at [place] by [facility]; took 4 NTG without relief

## 2019-02-19 NOTE — Progress Notes (Signed)
[place] for Heparin Indication: chest pain/ACS  Allergies  Allergen Reactions  . Amoxicillin     Has patient had a PCN reaction causing immediate rash, facial/tongue/throat swelling, SOB or lightheadedness with hypotension: Yes Has patient had a PCN reaction causing severe rash involving mucus membranes or skin necrosis: No Has patient had a PCN reaction that required hospitalization: No Has patient had a PCN reaction occurring within the last 10 years: No If all of the above answers are "NO", then may proceed with Cephalosporin use.  Sore throat   . Atorvastatin     Tremors     Patient Measurements: Height: 5\' 2"  (157.5 cm) Weight: 203 lb (92.1 kg) IBW/kg (Calculated) : 50.1 HEPARIN DW (KG): 71.5  Vital Signs: Temp: 97.7 F (36.5 C) (12/09 0709) Temp Source: Oral (12/09 0709) BP: 106/78 (12/09 1408) Pulse Rate: 59 (12/09 1408)  Labs: Recent Labs    02/17/19 0504 02/19/19 0515 02/19/19 0710 02/19/19 1200  HGB 12.1 12.2  --   --   HCT 37.9 36.9  --   --   PLT 202 221  --   --   APTT  --  35  --   --   LABPROT  --  12.0  --   --   INR  --  0.9  --   --   HEPARINUNFRC 0.20*  --   --  0.19*  CREATININE 0.53 0.65  --   --   TROPONINIHS  --  2,199* 1,782*  --    Estimated Creatinine Clearance: 78 mL/min (by C-G formula based on SCr of 0.65 mg/dL).  Medical History: Past Medical History:  Diagnosis Date  . Anxiety   . Asthma   . Bronchitis   . Chronic cough   . Complication of anesthesia    nausea and vomiting  . COPD (chronic obstructive pulmonary disease) ([place])   . Coronary artery disease   . Depression   . Diabetes mellitus without complication ([place])   . Diverticulosis   . Dyspnea   . Dysrhythmia   . Environmental and seasonal allergies   . Hypertension   . Lower extremity edema   . Tremors of nervous system   . Wheezing      Assessment: 63 year old female w/ recent cardiac cath and discharge 12/7. Returns  with chest pain and SOB. Pharmacy consulted to initiate and monitor heparin for CP.  Pt not on anticoagulants PTA per med list.  Goal of Therapy:  Heparin level 0.3-0.7 units/ml Monitor platelets by anticoagulation protocol: Yes   Plan:  12/9 1200 HL 0.19 subtherapeutic. Heparin 2000 unit bolus followed by increase in heparin drip to 1050 units/hr. HL at 2000. CBC daily while on heparin drip.  Tawnya Crook, PharmD 02/19/2019,2:55 PM

## 2019-02-19 NOTE — ED Notes (Addendum)
Pt c/o of central CP that radiates to right arm. PT also c/o SOB. Pt took 1st nitro at 0403 this morning and 2nd nitro and a xanax on the way to the hospital. Pt states it feels the exact same as her previous STEMI a few days ago. Pt states she feels tingling in her right hand and fingers. Pt states she also had an aortic dissection on the left side.

## 2019-02-19 NOTE — Progress Notes (Signed)
[place] for Heparin Indication: chest pain/ACS  Allergies  Allergen Reactions  . Amoxicillin     Has patient had a PCN reaction causing immediate rash, facial/tongue/throat swelling, SOB or lightheadedness with hypotension: Yes Has patient had a PCN reaction causing severe rash involving mucus membranes or skin necrosis: No Has patient had a PCN reaction that required hospitalization: No Has patient had a PCN reaction occurring within the last 10 years: No If all of the above answers are "NO", then may proceed with Cephalosporin use.  Sore throat   . Atorvastatin     Tremors     Patient Measurements: Height: 5\' 2"  (157.5 cm) Weight: 200 lb 11.2 oz (91 kg) IBW/kg (Calculated) : 50.1 HEPARIN DW (KG): 71.1  Vital Signs: Temp: 97.5 F (36.4 C) (12/09 1939) Temp Source: Oral (12/09 1939) BP: 105/62 (12/09 1939) Pulse Rate: 78 (12/09 1939)  Labs: Recent Labs    02/17/19 0504 02/19/19 0515 02/19/19 0710 02/19/19 1200 02/19/19 2045  HGB 12.1 12.2  --   --   --   HCT 37.9 36.9  --   --   --   PLT 202 221  --   --   --   APTT  --  35  --   --   --   LABPROT  --  12.0  --   --   --   INR  --  0.9  --   --   --   HEPARINUNFRC 0.20*  --   --  0.19* 0.30  CREATININE 0.53 0.65  --   --   --   TROPONINIHS  --  2,199* 1,782*  --   --    Estimated Creatinine Clearance: 77.5 mL/min (by C-G formula based on SCr of 0.65 mg/dL).  Medical History: Past Medical History:  Diagnosis Date  . Anxiety   . Asthma   . Bronchitis   . Chronic cough   . Complication of anesthesia    nausea and vomiting  . COPD (chronic obstructive pulmonary disease) ([place])   . Coronary artery disease   . Depression   . Diabetes mellitus without complication ([place])   . Diverticulosis   . Dyspnea   . Dysrhythmia   . Environmental and seasonal allergies   . Hypertension   . Lower extremity edema   . Tremors of nervous system   . Wheezing       Assessment: 63 year old female w/ recent cardiac cath and discharge 12/7. Returns with chest pain and SOB. Pharmacy consulted to initiate and monitor heparin for CP.  Pt not on anticoagulants PTA per med list.  Goal of Therapy:  Heparin level 0.3-0.7 units/ml Monitor platelets by anticoagulation protocol: Yes   Plan:  12/9 1200 HL 0.19 subtherapeutic. Heparin 2000 unit bolus followed by increase in heparin drip to 1050 units/hr. HL at 2000. CBC daily while on heparin drip.  12/9:  HL @ 2045 = 0.3  Will continue pt on current rate and recheck HL on 12/10 @ 0300.   Kemoni Quesenberry D, PharmD 02/19/2019,9:44 PM

## 2019-02-19 NOTE — ED Notes (Signed)
Patient complaining of worsening chest pain after ambulating to restroom. Reports pressure to middle of chest, radiating to right arm. MD notified.

## 2019-02-19 NOTE — ED Provider Notes (Signed)
[facility] Emergency Department Provider Note   ____________________________________________   First MD Initiated Contact with Patient 02/19/19 [phone]     (approximate)  I have reviewed the triage vital signs and the nursing notes.   HISTORY  Chief Complaint Chest Pain    HPI Courtney Blanchard is a 63 y.o. female who presents to the ED from home with a chief complaint of chest pain.  Patient with a history of CAD status post multiple stents, hypertension, hyperlipidemia, type 2 diabetes, COPD who recently had a STEMI on 12/3; cardiac cath performed with recommendations for medical management.  Reports upper chest pain starting approximately 4 AM associated with shortness of breath.  Took 4 nitroglycerin + Xanax prior to arrival without relief of symptoms.  Denies fever, cough, abdominal pain, vomiting, dizziness.    Cardiac cath by Dr. Darrold JunkerParaschos 02/13/19:  Previously placed Mid RCA stent (unknown type) is widely patent.  Previously placed Prox RCA stent (unknown type) is widely patent.  Previously placed Ost RCA to Prox RCA stent (unknown type) is widely patent.  Mid LAD to Dist LAD lesion is 70% stenosed.  1. One-vessel coronary artery disease with spontaneous coronary dissection mid to distal LAD 2. Patent stents mid proximal and ostial RCA 3. Mild reduced left ventricular function with apical dyskinesis    Past Medical History:  Diagnosis Date   Anxiety    Asthma    Bronchitis    Chronic cough    Complication of anesthesia    nausea and vomiting   COPD (chronic obstructive pulmonary disease) (HCC)    Coronary artery disease    Depression    Diabetes mellitus without complication (HCC)    Diverticulosis    Dyspnea    Dysrhythmia    Environmental and seasonal allergies    Hypertension    Lower extremity edema    Tremors of nervous system    Wheezing     Patient Active Problem List   Diagnosis Date Noted   Acute  ST elevation myocardial infarction (STEMI) involving left anterior descending (LAD) coronary artery (HCC) 02/13/2019   Hypertension    COPD (chronic obstructive pulmonary disease) (HCC)    Coronary artery disease    Depression    Anxiety    HLD (hyperlipidemia)    NSTEMI (non-ST elevated myocardial infarction) (HCC) 01/27/2018   Chest pain 08/31/2016    Past Surgical History:  Procedure Laterality Date   ABDOMINAL HYSTERECTOMY     BARIATRIC SURGERY     CARDIAC CATHETERIZATION     with stent   CHOLECYSTECTOMY     COLON SURGERY     LEFT HEART CATH AND CORONARY ANGIOGRAPHY N/A 01/28/2018   Procedure: LEFT HEART CATH AND CORONARY ANGIOGRAPHY;  Surgeon: Dalia HeadingFath, Kenneth A, MD;  Location: [facility] INVASIVE CV LAB;  Service: Cardiovascular;  Laterality: N/A;   LEFT HEART CATH AND CORONARY ANGIOGRAPHY N/A 02/13/2019   Procedure: LEFT HEART CATH AND CORONARY ANGIOGRAPHY;  Surgeon: Marcina MillardParaschos, Alexander, MD;  Location: [facility] INVASIVE CV LAB;  Service: Cardiovascular;  Laterality: N/A;    Prior to Admission medications   Medication Sig Start Date End Date Taking? Authorizing Provider  acetaminophen (TYLENOL) 500 MG tablet Take 500-1,000 mg by mouth 2 (two) times daily as needed for moderate pain or headache.     [provider]  ALPRAZolam Prudy Feeler(XANAX) 0.5 MG tablet Take 0.5 mg by mouth 2 (two) times daily as needed for anxiety.  07/04/16   [provider]  aspirin 81 MG tablet  Take 81 mg by mouth daily.     [provider]  atorvastatin (LIPITOR) 40 MG tablet Take 1 tablet (40 mg total) by mouth daily at 6 PM. Patient not taking: Reported on 02/01/2018 01/28/18 02/27/18  Saundra Shelling, MD  clopidogrel (PLAVIX) 75 MG tablet Take 1 tablet (75 mg total) by mouth daily. 02/17/19   Lavina Hamman, MD  DULoxetine (CYMBALTA) 20 MG capsule Take 20 mg by mouth daily. 02/04/19   [provider]  gabapentin (NEURONTIN) 300 MG capsule Take 600 mg by mouth at bedtime.      [provider]  guaiFENesin (MUCINEX) 600 MG 12 hr tablet Take 1 tablet (600 mg total) by mouth 2 (two) times daily. 02/17/19   Lavina Hamman, MD  isosorbide mononitrate (IMDUR) 30 MG 24 hr tablet Take 1 tablet (30 mg total) by mouth daily. 01/29/18 02/28/18  Saundra Shelling, MD  metoprolol succinate (TOPROL-XL) 25 MG 24 hr tablet Take 12.5 mg by mouth daily.     [provider]  montelukast (SINGULAIR) 10 MG tablet Take 10 mg by mouth daily.     [provider]  nitroGLYCERIN (NITROSTAT) 0.4 MG SL tablet Place 1 tablet (0.4 mg total) under the tongue every 5 (five) minutes as needed for chest pain. 02/17/19   Lavina Hamman, MD  PARoxetine (PAXIL) 20 MG tablet Take 40 mg by mouth at bedtime.  02/28/16   [provider]  simvastatin (ZOCOR) 40 MG tablet Take 1 tablet (40 mg total) by mouth daily at 6 PM. 02/17/19   Lavina Hamman, MD    Allergies Amoxicillin and Atorvastatin  Family History  Problem Relation Age of Onset   Hypertension Mother    Heart disease Father    CAD Father    Heart disease Sister    Stroke Sister     Social History Social History   Tobacco Use   Smoking status: Never Smoker   Smokeless tobacco: Never Used  Substance Use Topics   Alcohol use: No   Drug use: No    Review of Systems  Constitutional: No fever/chills Eyes: No visual changes. ENT: No sore throat. Cardiovascular: Positive for chest pain. Respiratory: Positive for shortness of breath. Gastrointestinal: No abdominal pain.  Positive for nausea, no vomiting.  No diarrhea.  No constipation. Genitourinary: Negative for dysuria. Musculoskeletal: Negative for back pain. Skin: Negative for rash. Neurological: Negative for headaches, focal weakness or numbness.   ____________________________________________   PHYSICAL EXAM:  VITAL SIGNS: ED Triage Vitals [02/19/19 0509]  Enc Vitals Group     BP      Pulse      Resp      Temp      Temp  src      SpO2      Weight 203 lb (92.1 kg)     Height 5\' 2"  (1.575 m)     Head Circumference      Peak Flow      Pain Score 8     Pain Loc      Pain Edu?      Excl. in [place]?     Constitutional: Alert and oriented.  Anxious appearing and in mild acute distress. Eyes: Conjunctivae are normal. PERRL. EOMI. Head: Atraumatic. Nose: No congestion/rhinnorhea. Mouth/Throat: Mucous membranes are moist.  Oropharynx non-erythematous. Neck: No stridor.   Cardiovascular: Normal rate, regular rhythm. Grossly normal heart sounds.  Good peripheral circulation. Respiratory: Normal respiratory effort.  No retractions. Lungs CTAB. Gastrointestinal: Soft  and nontender. No distention. No abdominal bruits. No CVA tenderness. Musculoskeletal: No lower extremity tenderness nor edema.  No joint effusions. Neurologic:  Normal speech and language. No gross focal neurologic deficits are appreciated.  Skin:  Skin is warm, dry and intact. No rash noted. Psychiatric: Mood and affect are normal. Speech and behavior are normal.  ____________________________________________   LABS (all labs ordered are listed, but only abnormal results are displayed)  Labs Reviewed  COMPREHENSIVE METABOLIC PANEL - Abnormal; Notable for the following components:      Result Value   Glucose, Bld 106 (*)    Calcium 8.7 (*)    All other components within normal limits  TROPONIN I (HIGH SENSITIVITY) - Abnormal; Notable for the following components:   Troponin I (High Sensitivity) 2,199 (*)    All other components within normal limits  SARS CORONAVIRUS 2 (TAT 6-24 HRS)  CBC  APTT  PROTIME-INR  HEPARIN LEVEL (UNFRACTIONATED)  TROPONIN I (HIGH SENSITIVITY)   ____________________________________________  EKG  ED ECG REPORT I, Lavonn Maxcy J, the attending physician, personally viewed and interpreted this ECG.   Date: 02/19/2019  EKG Time: 0516  Rate: 68  Rhythm: normal EKG, normal sinus rhythm  Axis: Normal   Intervals:none  ST&T Change: Nonspecific  ____________________________________________  RADIOLOGY  ED MD interpretation: No acute cardiopulmonary process  Official radiology report(s): Dg Chest Port 1 View  Result Date: 02/19/2019 CLINICAL DATA:  Chest pain. Shortness of breath. Recent cardiac catheterization. EXAM: PORTABLE CHEST 1 VIEW COMPARISON:  01/26/2018 FINDINGS: The cardiomediastinal contours are normal. Subsegmental atelectasis at the left lung base. Pulmonary vasculature is normal. No consolidation, pleural effusion, or pneumothorax. No acute osseous abnormalities are seen. IMPRESSION: Subsegmental atelectasis at the left lung base. Electronically Signed   By: Narda Rutherford M.D.   On: 02/19/2019 05:37    ____________________________________________   PROCEDURES  Procedure(s) performed (including Critical Care):  Procedures  CRITICAL CARE Performed by: Irean Hong   Total critical care time: 30 minutes  Critical care time was exclusive of separately billable procedures and treating other patients.  Critical care was necessary to treat or prevent imminent or life-threatening deterioration.  Critical care was time spent personally by me on the following activities: development of treatment plan with patient and/or surrogate as well as nursing, discussions with consultants, evaluation of patient's response to treatment, examination of patient, obtaining history from patient or surrogate, ordering and performing treatments and interventions, ordering and review of laboratory studies, ordering and review of radiographic studies, pulse oximetry and re-evaluation of patient's condition. ____________________________________________   INITIAL IMPRESSION / ASSESSMENT AND PLAN / ED COURSE  As part of my medical decision making, I reviewed the following data within the electronic MEDICAL RECORD NUMBER Nursing notes reviewed and incorporated, Labs reviewed, EKG interpreted, Old EKG  reviewed, Old chart reviewed, Radiograph reviewed, Discussed with admitting physician and Notes from prior ED visits     Courtney Blanchard was evaluated in Emergency Department on 02/19/2019 for the symptoms described in the history of present illness. She was evaluated in the context of the global COVID-19 pandemic, which necessitated consideration that the patient might be at risk for infection with the SARS-CoV-2 virus that causes COVID-19. Institutional protocols and algorithms that pertain to the evaluation of patients at risk for COVID-19 are in a state of rapid change based on information released by regulatory bodies including the CDC and federal and state organizations. These policies and algorithms were followed during the patient's care in the ED.  63 year old female with CAD status post recent MI who presents with recurrence of chest pain. Differential diagnosis includes, but is not limited to, ACS, aortic dissection, pulmonary embolism, cardiac tamponade, pneumothorax, pneumonia, pericarditis, myocarditis, GI-related causes including esophagitis/gastritis, and musculoskeletal chest wall pain.     Clinical Course as of Feb 19 643  Wed Feb 19, 2019  0604 Troponin 2199, down from almost 7000 4 days ago.  Will discuss with hospitalist to evaluate patient in emergency department for admission.   [JS]    Clinical Course User Index [JS] Irean Hong, MD     ____________________________________________   FINAL CLINICAL IMPRESSION(S) / ED DIAGNOSES  Final diagnoses:  Chest pain, unspecified type  Unstable angina [facility])     ED Discharge Orders    None       Note:  This document was prepared using Dragon voice recognition software and may include unintentional dictation errors.   Irean Hong, MD 02/19/19 [phone]

## 2019-02-19 NOTE — Progress Notes (Signed)
ANTICOAGULATION CONSULT NOTE - Initial Consult  Pharmacy Consult for Heparin Indication: chest pain/ACS  Allergies  Allergen Reactions  . Amoxicillin     Has patient had a PCN reaction causing immediate rash, facial/tongue/throat swelling, SOB or lightheadedness with hypotension: Yes Has patient had a PCN reaction causing severe rash involving mucus membranes or skin necrosis: No Has patient had a PCN reaction that required hospitalization: No Has patient had a PCN reaction occurring within the last 10 years: No If all of the above answers are "NO", then may proceed with Cephalosporin use.  Sore throat   . Atorvastatin     Tremors     Patient Measurements: Height: 5\' 2"  (157.5 cm) Weight: 203 lb (92.1 kg) IBW/kg (Calculated) : 50.1 HEPARIN DW (KG): 71.5  Vital Signs: Pulse Rate: 65 (12/09 0516)  Labs: Recent Labs    02/17/19 0504 02/19/19 0515  HGB 12.1 12.2  HCT 37.9 36.9  PLT 202 221  HEPARINUNFRC 0.20*  --   CREATININE 0.53  --    Estimated Creatinine Clearance: 78 mL/min (by C-G formula based on SCr of 0.53 mg/dL).  Medical History: Past Medical History:  Diagnosis Date  . Anxiety   . Asthma   . Bronchitis   . Chronic cough   . Complication of anesthesia    nausea and vomiting  . COPD (chronic obstructive pulmonary disease) ([place])   . Coronary artery disease   . Depression   . Diabetes mellitus without complication ([place])   . Diverticulosis   . Dyspnea   . Dysrhythmia   . Environmental and seasonal allergies   . Hypertension   . Lower extremity edema   . Tremors of nervous system   . Wheezing     Medications:  (Not in a hospital admission)   Assessment: Pharmacy asked to initiate and monitor Heparin for CP.  Pt not on anticoagulants PTA per med list.  Baseline labs ordered.   Goal of Therapy:  Heparin level 0.3-0.7 units/ml Monitor platelets by anticoagulation protocol: Yes   Plan:  Heparin 4000 unit bolus x 1 then infusion at 850  units/hr Check HL in 6 hrs  Vernica Wachtel A 02/19/2019,5:41 AM

## 2019-02-19 NOTE — Progress Notes (Signed)
Report called to floor Rn. Patient transporting via stretcher with heparin gtt going.

## 2019-02-20 DIAGNOSIS — R778 Other specified abnormalities of plasma proteins: Secondary | ICD-10-CM

## 2019-02-20 DIAGNOSIS — G629 Polyneuropathy, unspecified: Secondary | ICD-10-CM

## 2019-02-20 DIAGNOSIS — F329 Major depressive disorder, single episode, unspecified: Secondary | ICD-10-CM

## 2019-02-20 DIAGNOSIS — R5383 Other fatigue: Secondary | ICD-10-CM

## 2019-02-20 DIAGNOSIS — R079 Chest pain, unspecified: Secondary | ICD-10-CM

## 2019-02-20 DIAGNOSIS — I2542 Coronary artery dissection: Secondary | ICD-10-CM

## 2019-02-20 LAB — COMPREHENSIVE METABOLIC PANEL
ALT: 23 U/L (ref 0–44)
AST: 47 U/L — ABNORMAL HIGH (ref 15–41)
Albumin: 3.5 g/dL (ref 3.5–5.0)
Alkaline Phosphatase: 92 U/L (ref 38–126)
Anion gap: 9 (ref 5–15)
BUN: 10 mg/dL (ref 8–23)
CO2: 26 mmol/L (ref 22–32)
Calcium: 8.8 mg/dL — ABNORMAL LOW (ref 8.9–10.3)
Chloride: 103 mmol/L (ref 98–111)
Creatinine, Ser: 0.59 mg/dL (ref 0.44–1.00)
GFR calc Af Amer: >60 mL/min (ref >60)
GFR calc non Af Amer: >60 mL/min (ref >60)
Glucose, Bld: 98 mg/dL (ref 70–99)
Potassium: 4.3 mmol/L (ref 3.5–5.1)
Sodium: 138 mmol/L (ref 135–145)
Total Bilirubin: 0.7 mg/dL (ref 0.3–1.2)
Total Protein: 6.5 g/dL (ref 6.5–8.1)

## 2019-02-20 LAB — CBC
HCT: 36.4 % (ref 36.0–46.0)
Hemoglobin: 11.8 g/dL — ABNORMAL LOW (ref 12.0–15.0)
MCH: 29.4 pg (ref 26.0–34.0)
MCHC: 32.4 g/dL (ref 30.0–36.0)
MCV: 90.8 fL (ref 80.0–100.0)
Platelets: 235 10*3/uL (ref 150–400)
RBC: 4.01 MIL/uL (ref 3.87–5.11)
RDW: 13.2 % (ref 11.5–15.5)
WBC: 5.9 10*3/uL (ref 4.0–10.5)
nRBC: 0 % (ref 0.0–0.2)

## 2019-02-20 LAB — VITAMIN B12: Vitamin B-12: 1183 pg/mL — ABNORMAL HIGH (ref 180–914)

## 2019-02-20 LAB — TSH: TSH: 1.324 u[IU]/mL (ref 0.350–4.500)

## 2019-02-20 LAB — HEMOGLOBIN A1C
Hgb A1c MFr Bld: 5.8 % — ABNORMAL HIGH (ref 4.8–5.6)
Mean Plasma Glucose: 119.76 mg/dL

## 2019-02-20 LAB — HEPARIN LEVEL (UNFRACTIONATED): Heparin Unfractionated: 0.37 IU/mL (ref 0.30–0.70)

## 2019-02-20 MED ORDER — ONDANSETRON HCL 4 MG/2ML IJ SOLN
4.0000 mg | Freq: Four times a day (QID) | INTRAMUSCULAR | Status: DC | PRN
  Administered 2019-02-20: 4 mg via INTRAVENOUS
  Filled 2019-02-20: qty 2

## 2019-02-20 MED ORDER — SODIUM CHLORIDE 0.9 % IV BOLUS
250.0000 mL | Freq: Once | INTRAVENOUS | Status: AC
  Administered 2019-02-20: 250 mL via INTRAVENOUS

## 2019-02-20 MED ORDER — MAGNESIUM SULFATE 2 GM/50ML IV SOLN
2.0000 g | Freq: Once | INTRAVENOUS | Status: AC
  Administered 2019-02-20: 2 g via INTRAVENOUS
  Filled 2019-02-20: qty 50

## 2019-02-20 NOTE — Progress Notes (Signed)
[facility] Cardiology  SUBJECTIVE: Patient laying in bed, denies chest pain   Vitals:   02/19/19 1300 02/19/19 1408 02/19/19 1939 02/20/19 0327  BP: 130/88 106/78 105/62 (!) 109/57  Pulse: (!) 57 (!) 59 78 71  Resp: 19 20 18    Temp:   (!) 97.5 F (36.4 C) 98.5 F (36.9 C)  TempSrc:   Oral Oral  SpO2: 94% 97% 96% 96%  Weight:   91 kg 90.8 kg  Height:   5\' 2"  (1.575 m)      Intake/Output Summary (Last 24 hours) at 02/20/2019 0756 Last data filed at 02/20/2019 0406 Gross per 24 hour  Intake 55.5 ml  Output 300 ml  Net -244.5 ml      PHYSICAL EXAM  General: Well developed, well nourished, in no acute distress HEENT:  Normocephalic and atramatic Neck:  No JVD.  Lungs: Clear bilaterally to auscultation and percussion. Heart: HRRR . Normal S1 and S2 without gallops or murmurs.  Abdomen: Bowel sounds are positive, abdomen soft and non-tender  Msk:  Back normal, normal gait. Normal strength and tone for age. Extremities: No clubbing, cyanosis or edema.   Neuro: Alert and oriented X 3. Psych:  Good affect, responds appropriately   LABS: Basic Metabolic Panel: Recent Labs    02/19/19 0515 02/20/19 0253  NA 137 138  K 3.9 4.3  CL 102 103  CO2 26 26  GLUCOSE 106* 98  BUN 12 10  CREATININE 0.65 0.59  CALCIUM 8.7* 8.8*   Liver Function Tests: Recent Labs    02/19/19 0515 02/20/19 0253  AST 27 47*  ALT 22 23  ALKPHOS 100 92  BILITOT 0.8 0.7  PROT 6.7 6.5  ALBUMIN 3.8 3.5   No results for input(s): LIPASE, AMYLASE in the last 72 hours. CBC: Recent Labs    02/19/19 0515 02/20/19 0253  WBC 5.5 5.9  HGB 12.2 11.8*  HCT 36.9 36.4  MCV 87.9 90.8  PLT 221 235   Cardiac Enzymes: No results for input(s): CKTOTAL, CKMB, CKMBINDEX, TROPONINI in the last 72 hours. BNP: Invalid input(s): POCBNP D-Dimer: No results for input(s): DDIMER in the last 72 hours. Hemoglobin A1C: No results for input(s): HGBA1C in the last 72 hours. Fasting Lipid Panel: No results for  input(s): CHOL, HDL, LDLCALC, TRIG, CHOLHDL, LDLDIRECT in the last 72 hours. Thyroid Function Tests: No results for input(s): TSH, T4TOTAL, T3FREE, THYROIDAB in the last 72 hours.  Invalid input(s): FREET3 Anemia Panel: No results for input(s): VITAMINB12, FOLATE, FERRITIN, TIBC, IRON, RETICCTPCT in the last 72 hours.  DG Chest Port 1 View  Result Date: 02/19/2019 CLINICAL DATA:  Chest pain. Shortness of breath. Recent cardiac catheterization. EXAM: PORTABLE CHEST 1 VIEW COMPARISON:  01/26/2018 FINDINGS: The cardiomediastinal contours are normal. Subsegmental atelectasis at the left lung base. Pulmonary vasculature is normal. No consolidation, pleural effusion, or pneumothorax. No acute osseous abnormalities are seen. IMPRESSION: Subsegmental atelectasis at the left lung base. Electronically Signed   By: Keith Rake M.D.   On: 02/19/2019 05:37     Echo LVEF 40 to 45% by 2D echo 02/13/2019  TELEMETRY: Sinus rhythm:  ASSESSMENT AND PLAN:  Active Problems:   Chest pain    1.  Chest pain, with typical and atypical features, nondiagnostic ECG, with elevated troponin trending down (2199, 1782).  Troponin [id number] on on 02/15/2019. 2.  Anterior STEMI 02/13/2019 with spontaneous coronary artery dissection mid LAD 3.  Mildly reduced left ventricular function, with LVEF 40 to 45%  Recommendations  1.  Continue medical therapy 2.  Continue heparin drip, likely DC in a.m. 3.  Continue dual antiplatelet therapy uninterrupted for 1 year 4.  Increased isosorbide mononitrate to 60 mg daily   Courtney Millard, MD, PhD, [facility] 02/20/2019 7:56 AM

## 2019-02-20 NOTE — Progress Notes (Signed)
[place] for Heparin Indication: chest pain/ACS  Allergies  Allergen Reactions  . Amoxicillin     Has patient had a PCN reaction causing immediate rash, facial/tongue/throat swelling, SOB or lightheadedness with hypotension: Yes Has patient had a PCN reaction causing severe rash involving mucus membranes or skin necrosis: No Has patient had a PCN reaction that required hospitalization: No Has patient had a PCN reaction occurring within the last 10 years: No If all of the above answers are "NO", then may proceed with Cephalosporin use.  Sore throat   . Atorvastatin     Tremors     Patient Measurements: Height: 5\' 2"  (157.5 cm) Weight: 200 lb 1.6 oz (90.8 kg) IBW/kg (Calculated) : 50.1 HEPARIN DW (KG): 71.1  Vital Signs: Temp: 98.5 F (36.9 C) (12/10 0327) Temp Source: Oral (12/10 0327) BP: 109/57 (12/10 0327) Pulse Rate: 71 (12/10 0327)  Labs: Recent Labs    02/17/19 0504 02/19/19 0515 02/19/19 0710 02/19/19 1200 02/19/19 2045 02/20/19 0253  HGB 12.1 12.2  --   --   --  11.8*  HCT 37.9 36.9  --   --   --  36.4  PLT 202 221  --   --   --  235  APTT  --  35  --   --   --   --   LABPROT  --  12.0  --   --   --   --   INR  --  0.9  --   --   --   --   HEPARINUNFRC 0.20*  --   --  0.19* 0.30 0.37  CREATININE 0.53 0.65  --   --   --  0.59  TROPONINIHS  --  2,199* 1,782*  --   --   --    Estimated Creatinine Clearance: 77.4 mL/min (by C-G formula based on SCr of 0.59 mg/dL).  Medical History: Past Medical History:  Diagnosis Date  . Anxiety   . Asthma   . Bronchitis   . Chronic cough   . Complication of anesthesia    nausea and vomiting  . COPD (chronic obstructive pulmonary disease) ([place])   . Coronary artery disease   . Depression   . Diabetes mellitus without complication ([place])   . Diverticulosis   . Dyspnea   . Dysrhythmia   . Environmental and seasonal allergies   . Hypertension   . Lower extremity edema   .  Tremors of nervous system   . Wheezing      Assessment: 63 year old female w/ recent cardiac cath and discharge 12/7. Returns with chest pain and SOB. Pharmacy consulted to initiate and monitor heparin for CP.  Pt not on anticoagulants PTA per med list.  Goal of Therapy:  Heparin level 0.3-0.7 units/ml Monitor platelets by anticoagulation protocol: Yes   Plan:  12/9 1200 HL 0.19 subtherapeutic. Heparin 2000 unit bolus followed by increase in heparin drip to 1050 units/hr. HL at 2000. CBC daily while on heparin drip.  12/9:  HL @ 2045 = 0.3  Will continue pt on current rate and recheck HL on 12/10 @ 0300.   12/10 @ 0253 HL = 0.37, therapeutic.  Continue current rate and check HL daily  Ena Dawley, PharmD 02/20/2019,4:38 AM

## 2019-02-20 NOTE — Plan of Care (Signed)
Denies CP, SOB, N/V.  Does report occasional fluttering of her heart that is unsustained.   Problem: Education: Goal: Knowledge of General Education information will improve Description: Including pain rating scale, medication(s)/side effects and non-pharmacologic comfort measures Outcome: Progressing   Problem: Health Behavior/Discharge Planning: Goal: Ability to manage health-related needs will improve Outcome: Progressing   Problem: Clinical Measurements: Goal: Ability to maintain clinical measurements within normal limits will improve Outcome: Progressing Goal: Will remain free from infection Outcome: Progressing Goal: Diagnostic test results will improve Outcome: Progressing Goal: Respiratory complications will improve Outcome: Progressing Goal: Cardiovascular complication will be avoided Outcome: Progressing   Problem: Activity: Goal: Risk for activity intolerance will decrease Outcome: Progressing   Problem: Nutrition: Goal: Adequate nutrition will be maintained Outcome: Progressing   Problem: Coping: Goal: Level of anxiety will decrease Outcome: Progressing   Problem: Elimination: Goal: Will not experience complications related to bowel motility Outcome: Progressing Goal: Will not experience complications related to urinary retention Outcome: Progressing   Problem: Pain Managment: Goal: General experience of comfort will improve Outcome: Progressing   Problem: Safety: Goal: Ability to remain free from injury will improve Outcome: Progressing   Problem: Skin Integrity: Goal: Risk for impaired skin integrity will decrease Outcome: Progressing

## 2019-02-20 NOTE — Progress Notes (Signed)
Patient ID: Courtney Blanchard, female   DOB: 1955-09-08, 63 y.o.   MRN: [id number] [facility] PROGRESS NOTE  Courtney Blanchard VQQ:[id number] DOB: 1955-08-19 DOA: 02/19/2019 PCP: [place] Pink, MD  HPI/Subjective: Patient still feels fatigued.  She has been feeling this way for a while.  She is tired and short of breath with ambulation.  She does feel her heart beating fast and has skipped beats and some fluttering occasionally.  Objective: Vitals:   02/20/19 0855 02/20/19 1053  BP: 104/65 96/60  Pulse: 64 67  Resp: 16   Temp: 98.7 F (37.1 C)   SpO2: 93%     Intake/Output Summary (Last 24 hours) at 02/20/2019 1347 Last data filed at 02/20/2019 1248 Gross per 24 hour  Intake 295.5 ml  Output 500 ml  Net -204.5 ml   Filed Weights   02/19/19 0509 02/19/19 1939 02/20/19 0327  Weight: 92.1 kg 91 kg 90.8 kg    ROS: Review of Systems  Constitutional: Positive for malaise/fatigue. Negative for chills and fever.  Eyes: Negative for blurred vision.  Respiratory: Positive for shortness of breath. Negative for cough.   Cardiovascular: Positive for palpitations. Negative for chest pain.  Gastrointestinal: Negative for abdominal pain, constipation, diarrhea, nausea and vomiting.  Genitourinary: Negative for dysuria.  Musculoskeletal: Negative for joint pain.  Neurological: Negative for dizziness and headaches.   Exam: Physical Exam  Constitutional: She is oriented to person, place, and time.  HENT:  Nose: No mucosal edema.  Mouth/Throat: No oropharyngeal exudate or posterior oropharyngeal edema.  Eyes: Pupils are equal, round, and reactive to light. Conjunctivae, EOM and lids are normal.  Neck: No JVD present. Carotid bruit is not present. No thyroid mass and no thyromegaly present.  Cardiovascular: S1 normal and S2 normal. Exam reveals no gallop.  No murmur heard. Pulses:      Dorsalis pedis pulses are 2+ on the right side and 2+ on the left side.  Respiratory: No  respiratory distress. She has decreased breath sounds in the right lower field and the left lower field. She has no wheezes. She has no rhonchi. She has no rales.  GI: Soft. Bowel sounds are normal. There is no abdominal tenderness.  Musculoskeletal:     Cervical back: No edema.     Right ankle: No swelling.     Left ankle: No swelling.  Lymphadenopathy:    She has no cervical adenopathy.  Neurological: She is alert and oriented to person, place, and time. No cranial nerve deficit.  Skin: Skin is warm. No rash noted. Nails show no clubbing.  Psychiatric: She has a normal mood and affect.      Data Reviewed: Basic Metabolic Panel: Recent Labs  Lab 02/15/19 0849 02/17/19 0504 02/19/19 0515 02/20/19 0253  NA 139 138 137 138  K 4.0 3.9 3.9 4.3  CL 105 103 102 103  CO2 27 28 26 26   GLUCOSE 96 98 106* 98  BUN 11 15 12 10   CREATININE 0.65 0.53 0.65 0.59  CALCIUM 8.8* 8.7* 8.7* 8.8*   Liver Function Tests: Recent Labs  Lab 02/19/19 0515 02/20/19 0253  AST 27 47*  ALT 22 23  ALKPHOS 100 92  BILITOT 0.8 0.7  PROT 6.7 6.5  ALBUMIN 3.8 3.5   CBC: Recent Labs  Lab 02/15/19 0849 02/16/19 0423 02/17/19 0504 02/19/19 0515 02/20/19 0253  WBC 4.7 7.1 7.8 5.5 5.9  HGB 11.5* 12.3 12.1 12.2 11.8*  HCT 34.8* 37.2 37.9 36.9 36.4  MCV 87.9 87.3 90.5  87.9 90.8  PLT 167 190 202 221 235    CBG: Recent Labs  Lab 02/15/19 1246  GLUCAP 97    Recent Results (from the past 240 hour(s))  MRSA PCR Screening     Status: None   Collection Time: 02/13/19  6:30 AM   Specimen: Nasal Mucosa; Nasopharyngeal  Result Value Ref Range Status   MRSA by PCR NEGATIVE NEGATIVE Final    Comment:        The GeneXpert MRSA Assay (FDA approved for NASAL specimens only), is one component of a comprehensive MRSA colonization surveillance program. It is not intended to diagnose MRSA infection nor to guide or monitor treatment for MRSA infections. Performed at [facility], [address] [place], [place] [address]   SARS Coronavirus 2 by RT PCR (hospital order, performed in [facility] hospital lab) Nasopharyngeal Nasopharyngeal Swab     Status: None   Collection Time: 02/13/19  6:30 AM   Specimen: Nasopharyngeal Swab  Result Value Ref Range Status   SARS Coronavirus 2 NEGATIVE NEGATIVE Final    Comment: (NOTE) SARS-CoV-2 target nucleic acids are NOT DETECTED. The SARS-CoV-2 RNA is generally detectable in upper and lower respiratory specimens during the acute phase of infection. The lowest concentration of SARS-CoV-2 viral copies this assay can detect is 250 copies / mL. A negative result does not preclude SARS-CoV-2 infection and should not be used as the sole basis for treatment or other patient management decisions.  A negative result may occur with improper specimen collection / handling, submission of specimen other than nasopharyngeal swab, presence of viral mutation(s) within the areas targeted by this assay, and inadequate number of viral copies (<250 copies / mL). A negative result must be combined with clinical observations, patient history, and epidemiological information. Fact Sheet for Patients:   BoilerBrush.com.cy Fact Sheet for Healthcare Providers: https://pope.com/ This test is not yet approved or cleared  by the [place] FDA and has been authorized for detection and/or diagnosis of SARS-CoV-2 by FDA under an Emergency Use Authorization (EUA).  This EUA will remain in effect (meaning this test can be used) for the duration of the COVID-19 declaration under Section 564(b)(1) of the Act, 21 U.S.C. section 360bbb-3(b)(1), unless the authorization is terminated or revoked sooner. Performed at [facility], [address] [place], [place] [address]   SARS CORONAVIRUS 2 (TAT 6-24 HRS) Nasopharyngeal Nasopharyngeal Swab     Status: None   Collection Time: 02/19/19  5:43 AM    Specimen: Nasopharyngeal Swab  Result Value Ref Range Status   SARS Coronavirus 2 NEGATIVE NEGATIVE Final    Comment: (NOTE) SARS-CoV-2 target nucleic acids are NOT DETECTED. The SARS-CoV-2 RNA is generally detectable in upper and lower respiratory specimens during the acute phase of infection. Negative results do not preclude SARS-CoV-2 infection, do not rule out co-infections with other pathogens, and should not be used as the sole basis for treatment or other patient management decisions. Negative results must be combined with clinical observations, patient history, and epidemiological information. The expected result is Negative. Fact Sheet for Patients: HairSlick.no Fact Sheet for Healthcare Providers: quierodirigir.com This test is not yet approved or cleared by the [place] FDA and  has been authorized for detection and/or diagnosis of SARS-CoV-2 by FDA under an Emergency Use Authorization (EUA). This EUA will remain  in effect (meaning this test can be used) for the duration of the COVID-19 declaration under Section 56 4(b)(1) of the Act, 21 U.S.C. section 360bbb-3(b)(1),  unless the authorization is terminated or revoked sooner. Performed at [facility], [address] [place], [place] [address]      Studies: DG Chest Port 1 View  Result Date: 02/19/2019 CLINICAL DATA:  Chest pain. Shortness of breath. Recent cardiac catheterization. EXAM: PORTABLE CHEST 1 VIEW COMPARISON:  01/26/2018 FINDINGS: The cardiomediastinal contours are normal. Subsegmental atelectasis at the left lung base. Pulmonary vasculature is normal. No consolidation, pleural effusion, or pneumothorax. No acute osseous abnormalities are seen. IMPRESSION: Subsegmental atelectasis at the left lung base. Electronically Signed   By: Narda RutherfordMelanie  Sanford M.D.   On: 02/19/2019 05:37    Scheduled Meds: . aspirin EC  81 mg Oral Daily  . clopidogrel  75  mg Oral Daily  . DULoxetine  20 mg Oral Daily  . gabapentin  600 mg Oral QHS  . guaiFENesin  600 mg Oral BID  . isosorbide mononitrate  60 mg Oral Daily  . montelukast  10 mg Oral Daily  . simvastatin  40 mg Oral q1800   Continuous Infusions: . heparin 1,050 Units/hr (02/20/19 0328)    Assessment/Plan:  1. Chest pain with elevated troponin which is trending down.  Patient had a recent STEMI anteriorly on 02/13/2019 with spontaneous coronary artery dissection mid LAD.  Cardiology would like to do conservative management with heparin drip to tomorrow morning.  Patient on aspirin, Plavix and Zocor.  Patient has an allergy to Lipitor.  Blood pressure and heart rate on the lower side making beta-blocker hard to start. 2. Fatigue.  Check a TSH and B12 level 3. Depression on Cymbalta 4. Neuropathy on gabapentin  Code Status:     Code Status Orders  (From admission, onward)         Start     Ordered   02/19/19 0732  Full code  Continuous     02/19/19 0731        Code Status History    Date Active Date Inactive Code Status Order ID Comments User Context   02/13/2019 0819 02/17/2019 1604 Full Code [id number]  Lorretta Harpiu, Xilin, MD Inpatient   02/13/2019 [id number] 02/13/2019 0818 Full Code [id number]  Marcina MillardParaschos, Alexander, MD Inpatient   01/26/2018 1320 01/28/2018 1647 Full Code [id number]  Houston SirenSainani, Vivek J, MD Inpatient   08/31/2016 2002 09/01/2016 2215 Full Code [id number]  Houston SirenSainani, Vivek J, MD Inpatient   Advance Care Planning Activity     Family Communication: Spoke with husband on the phone Disposition Plan: Hopefully will be able to discharge tomorrow  Consultants:  Cardiology  Time spent: 28 minutes  [employer]  [facility]

## 2019-02-20 NOTE — Progress Notes (Signed)
Pt c/o "migraine-like" headache with bilious emesis of 300 mL.  Pain unrelieved with xanax and tylenol.  Morphine 4mg  IV given.  MD notified and awaiting an order for antiemetic.

## 2019-02-21 LAB — HEPARIN LEVEL (UNFRACTIONATED): Heparin Unfractionated: 0.36 IU/mL (ref 0.30–0.70)

## 2019-02-21 LAB — CBC
HCT: 37.5 % (ref 36.0–46.0)
Hemoglobin: 12.3 g/dL (ref 12.0–15.0)
MCH: 29 pg (ref 26.0–34.0)
MCHC: 32.8 g/dL (ref 30.0–36.0)
MCV: 88.4 fL (ref 80.0–100.0)
Platelets: 242 10*3/uL (ref 150–400)
RBC: 4.24 MIL/uL (ref 3.87–5.11)
RDW: 13.4 % (ref 11.5–15.5)
WBC: 5.7 10*3/uL (ref 4.0–10.5)
nRBC: 0 % (ref 0.0–0.2)

## 2019-02-21 MED ORDER — ISOSORBIDE MONONITRATE ER 60 MG PO TB24: 60.0000 mg | ORAL_TABLET | Freq: Every day | ORAL | 0 refills | Status: DC

## 2019-02-21 NOTE — Discharge Summary (Signed)
[facility] [place] [facility]   PATIENT NAME: Courtney Blanchard    MR#:  [id number]  DATE OF BIRTH:  1955-03-15  DATE OF ADMISSION:  02/19/2019 ADMITTING PHYSICIAN: Alford Highlandichard Kaiyu Mirabal, MD  DATE OF DISCHARGE: 02/21/2019 11:29 AM  PRIMARY CARE PHYSICIAN: Jerl MinaHedrick, James, MD    ADMISSION DIAGNOSIS:  Unstable angina (HCC) [I20.0] Chest pain, unspecified type [R07.9] Chest pain [R07.9]  DISCHARGE DIAGNOSIS:  Active Problems:   Chest pain   Elevated troponin   Spontaneous dissection of coronary artery   Fatigue   Neuropathy   SECONDARY DIAGNOSIS:   Past Medical History:  Diagnosis Date  . Anxiety   . Asthma   . Bronchitis   . Chronic cough   . Complication of anesthesia    nausea and vomiting  . COPD (chronic obstructive pulmonary disease) (HCC)   . Coronary artery disease   . Depression   . Diabetes mellitus without complication (HCC)   . Diverticulosis   . Dyspnea   . Dysrhythmia   . Environmental and seasonal allergies   . Hypertension   . Lower extremity edema   . Tremors of nervous system   . Wheezing     HOSPITAL COURSE:   1.  Chest pain with elevated troponin which is trending down.  Patient had a recent STEMI earlier in the month on 02/13/2019 with a spontaneous coronary artery dissection in the mid LAD.  Cardiology would like to do conservative management.  Patient was on heparin drip for 2 days.  Patient will continue on aspirin Plavix and Zocor.  Patient has an allergy to Lipitor.  Blood pressure and heart rate on the lower side making beta-blocker contraindicated at this time.  Cardiac rehab as outpatient.  Imdur dose increase. 2.  Fatigue.  TSH in normal range.  B12 level high.  Cardiac rehab as outpatient. 3.  Depression on Cymbalta 4.  Neuropathy on gabapentin 5.  Nausea vomiting with headache last night.  This has resolved.  Feeling fine currently  DISCHARGE CONDITIONS:   Satisfactory  CONSULTS OBTAINED:  Treatment Team:   Marcina MillardParaschos, Alexander, MD Dalia HeadingFath, Kenneth A, MD  DRUG ALLERGIES:   Allergies  Allergen Reactions  . Amoxicillin     Has patient had a PCN reaction causing immediate rash, facial/tongue/throat swelling, SOB or lightheadedness with hypotension: Yes Has patient had a PCN reaction causing severe rash involving mucus membranes or skin necrosis: No Has patient had a PCN reaction that required hospitalization: No Has patient had a PCN reaction occurring within the last 10 years: No If all of the above answers are "NO", then may proceed with Cephalosporin use.  Sore throat   . Atorvastatin     Tremors     DISCHARGE MEDICATIONS:   Allergies as of 02/21/2019      Reactions   Amoxicillin    Has patient had a PCN reaction causing immediate rash, facial/tongue/throat swelling, SOB or lightheadedness with hypotension: Yes Has patient had a PCN reaction causing severe rash involving mucus membranes or skin necrosis: No Has patient had a PCN reaction that required hospitalization: No Has patient had a PCN reaction occurring within the last 10 years: No If all of the above answers are "NO", then may proceed with Cephalosporin use. Sore throat   Atorvastatin    Tremors       Medication List    STOP taking these medications   metoprolol succinate 25 MG 24 hr tablet Commonly known as: TOPROL-XL     TAKE these  medications   acetaminophen 500 MG tablet Commonly known as: TYLENOL Take 500-1,000 mg by mouth 2 (two) times daily as needed for moderate pain or headache.   ALPRAZolam 1 MG 24 hr tablet Commonly known as: XANAX XR Take 1 mg by mouth daily.   aspirin 81 MG tablet Take 81 mg by mouth daily.   clopidogrel 75 MG tablet Commonly known as: PLAVIX Take 1 tablet (75 mg total) by mouth daily.   DULoxetine 20 MG capsule Commonly known as: CYMBALTA Take 20 mg by mouth daily.   gabapentin 300 MG capsule Commonly known as: NEURONTIN Take 600 mg by mouth at bedtime.    guaiFENesin 600 MG 12 hr tablet Commonly known as: MUCINEX Take 1 tablet (600 mg total) by mouth 2 (two) times daily.   isosorbide mononitrate 60 MG 24 hr tablet Commonly known as: IMDUR Take 1 tablet (60 mg total) by mouth daily. What changed:   medication strength  how much to take   montelukast 10 MG tablet Commonly known as: SINGULAIR Take 10 mg by mouth daily.   nitroGLYCERIN 0.4 MG SL tablet Commonly known as: NITROSTAT Place 1 tablet (0.4 mg total) under the tongue every 5 (five) minutes as needed for chest pain.   simvastatin 40 MG tablet Commonly known as: ZOCOR Take 1 tablet (40 mg total) by mouth daily at 6 PM.        DISCHARGE INSTRUCTIONS:   Follow-up PMD 5 days Follow-up cardiology 1 week  If you experience worsening of your admission symptoms, develop shortness of breath, life threatening emergency, suicidal or homicidal thoughts you must seek medical attention immediately by calling 911 or calling your MD immediately  if symptoms less severe.  You Must read complete instructions/literature along with all the possible adverse reactions/side effects for all the Medicines you take and that have been prescribed to you. Take any new Medicines after you have completely understood and accept all the possible adverse reactions/side effects.   Please note  You were cared for by a hospitalist during your hospital stay. If you have any questions about your discharge medications or the care you received while you were in the hospital after you are discharged, you can call the unit and asked to speak with the hospitalist on call if the hospitalist that took care of you is not available. Once you are discharged, your primary care physician will handle any further medical issues. Please note that NO REFILLS for any discharge medications will be authorized once you are discharged, as it is imperative that you return to your primary care physician (or establish a relationship  with a primary care physician if you do not have one) for your aftercare needs so that they can reassess your need for medications and monitor your lab values.    Today   CHIEF COMPLAINT:   Chief Complaint  Patient presents with  . Chest Pain    HISTORY OF PRESENT ILLNESS:  Courtney Blanchard  is a 63 y.o. female came in with chest pain   VITAL SIGNS:  Blood pressure 103/64, pulse (!) 58, temperature 98.1 F (36.7 C), temperature source Oral, resp. rate 17, height 5\' 2"  (1.575 m), weight 90.1 kg, SpO2 97 %.  I/O:    Intake/Output Summary (Last 24 hours) at 02/21/2019 1536 Last data filed at 02/21/2019 0900 Gross per 24 hour  Intake 420.46 ml  Output 1100 ml  Net -679.54 ml    PHYSICAL EXAMINATION:  GENERAL:  64 y.o.-year-old patient lying in  the bed with no acute distress.  EYES: Pupils equal, round, reactive to light and accommodation. No scleral icterus. Extraocular muscles intact.  HEENT: Head atraumatic, normocephalic. Oropharynx and nasopharynx clear.  NECK:  Supple, no jugular venous distention. No thyroid enlargement, no tenderness.  LUNGS: Normal breath sounds bilaterally, no wheezing, rales,rhonchi or crepitation. No use of accessory muscles of respiration.  CARDIOVASCULAR: S1, S2 normal. No murmurs, rubs, or gallops.  ABDOMEN: Soft, non-tender, non-distended. Bowel sounds present. No organomegaly or mass.  EXTREMITIES: No pedal edema, cyanosis, or clubbing.  NEUROLOGIC: Cranial nerves II through XII are intact. Muscle strength 5/5 in all extremities. Sensation intact. Gait not checked.  PSYCHIATRIC: The patient is alert and oriented x 3.  SKIN: No obvious rash, lesion, or ulcer.   DATA REVIEW:   CBC Recent Labs  Lab 02/21/19 0456  WBC 5.7  HGB 12.3  HCT 37.5  PLT 242    Chemistries  Recent Labs  Lab 02/20/19 0253  NA 138  K 4.3  CL 103  CO2 26  GLUCOSE 98  BUN 10  CREATININE 0.59  CALCIUM 8.8*  AST 47*  ALT 23  ALKPHOS 92  BILITOT 0.7     Microbiology Results  Results for orders placed or performed during the hospital encounter of 02/19/19  SARS CORONAVIRUS 2 (TAT 6-24 HRS) Nasopharyngeal Nasopharyngeal Swab     Status: None   Collection Time: 02/19/19  5:43 AM   Specimen: Nasopharyngeal Swab  Result Value Ref Range Status   SARS Coronavirus 2 NEGATIVE NEGATIVE Final    Comment: (NOTE) SARS-CoV-2 target nucleic acids are NOT DETECTED. The SARS-CoV-2 RNA is generally detectable in upper and lower respiratory specimens during the acute phase of infection. Negative results do not preclude SARS-CoV-2 infection, do not rule out co-infections with other pathogens, and should not be used as the sole basis for treatment or other patient management decisions. Negative results must be combined with clinical observations, patient history, and epidemiological information. The expected result is Negative. Fact Sheet for Patients: HairSlick.no Fact Sheet for Healthcare Providers: quierodirigir.com This test is not yet approved or cleared by the [place] FDA and  has been authorized for detection and/or diagnosis of SARS-CoV-2 by FDA under an Emergency Use Authorization (EUA). This EUA will remain  in effect (meaning this test can be used) for the duration of the COVID-19 declaration under Section 56 4(b)(1) of the Act, 21 U.S.C. section 360bbb-3(b)(1), unless the authorization is terminated or revoked sooner. Performed at [facility], [address], [place], [place] [address]      Management plans discussed with the patient, family (husband yesterday) and they are in agreement.  CODE STATUS:  Code Status History    Date Active Date Inactive Code Status Order ID Comments User Context   02/19/2019 0731 02/21/2019 1434 Full Code [id number]  Leatha Gilding, MD ED   02/13/2019 0819 02/17/2019 1604 Full Code [id number]  Lorretta Harp, MD Inpatient   02/13/2019 [phone]  02/13/2019 0818 Full Code [id number]  Marcina Millard, MD Inpatient   01/26/2018 1320 01/28/2018 1647 Full Code [id number]  Houston Siren, MD Inpatient   08/31/2016 2002 09/01/2016 2215 Full Code [id number]  Houston Siren, MD Inpatient   Advance Care Planning Activity      TOTAL TIME TAKING CARE OF THIS PATIENT: 34 minutes.    Alford Highland M.D on 02/21/2019 at 3:36 PM  Between 7am to 6pm - Pager - [phone]  After 6pm go to [email or web address] - password  [email or web address]  [facility]  CC: Primary care physician; Jerl Mina, MD

## 2019-02-21 NOTE — Plan of Care (Signed)
Discharge instructions provided to pt.  All questions addressed.  Understanding verified through teach back.  Awaiting transportation home via POV.  Problem: Activity: Goal: Risk for activity intolerance will decrease Outcome: Adequate for Discharge   Problem: Nutrition: Goal: Adequate nutrition will be maintained Outcome: Adequate for Discharge   Problem: Coping: Goal: Level of anxiety will decrease Outcome: Adequate for Discharge   Problem: Elimination: Goal: Will not experience complications related to bowel motility Outcome: Adequate for Discharge Goal: Will not experience complications related to urinary retention Outcome: Adequate for Discharge   Problem: Pain Managment: Goal: General experience of comfort will improve Outcome: Adequate for Discharge   Problem: Safety: Goal: Ability to remain free from injury will improve Outcome: Adequate for Discharge   Problem: Skin Integrity: Goal: Risk for impaired skin integrity will decrease Outcome: Adequate for Discharge   Problem: Education: Goal: Knowledge of General Education information will improve Description: Including pain rating scale, medication(s)/side effects and non-pharmacologic comfort measures Outcome: Adequate for Discharge   Problem: Health Behavior/Discharge Planning: Goal: Ability to manage health-related needs will improve Outcome: Adequate for Discharge   Problem: Clinical Measurements: Goal: Ability to maintain clinical measurements within normal limits will improve Outcome: Adequate for Discharge Goal: Will remain free from infection Outcome: Adequate for Discharge Goal: Diagnostic test results will improve Outcome: Adequate for Discharge Goal: Respiratory complications will improve Outcome: Adequate for Discharge Goal: Cardiovascular complication will be avoided Outcome: Adequate for Discharge

## 2019-02-21 NOTE — Progress Notes (Signed)
[place] for Heparin Indication: chest pain/ACS  Allergies  Allergen Reactions  . Amoxicillin     Has patient had a PCN reaction causing immediate rash, facial/tongue/throat swelling, SOB or lightheadedness with hypotension: Yes Has patient had a PCN reaction causing severe rash involving mucus membranes or skin necrosis: No Has patient had a PCN reaction that required hospitalization: No Has patient had a PCN reaction occurring within the last 10 years: No If all of the above answers are "NO", then may proceed with Cephalosporin use.  Sore throat   . Atorvastatin     Tremors     Patient Measurements: Height: 5\' 2"  (157.5 cm) Weight: 198 lb 11.2 oz (90.1 kg) IBW/kg (Calculated) : 50.1 HEPARIN DW (KG): 71.1  Vital Signs: Temp: 97.8 F (36.6 C) (12/11 0436) Temp Source: Oral (12/11 0436) BP: 105/67 (12/11 0436) Pulse Rate: 60 (12/11 0436)  Labs: Recent Labs    02/19/19 0515 02/19/19 0710 02/19/19 2045 02/20/19 0253 02/21/19 0456  HGB 12.2  --   --  11.8* 12.3  HCT 36.9  --   --  36.4 37.5  PLT 221  --   --  235 242  APTT 35  --   --   --   --   LABPROT 12.0  --   --   --   --   INR 0.9  --   --   --   --   HEPARINUNFRC  --   --  0.30 0.37 0.36  CREATININE 0.65  --   --  0.59  --   TROPONINIHS 2,199* 1,782*  --   --   --    Estimated Creatinine Clearance: 77.1 mL/min (by C-G formula based on SCr of 0.59 mg/dL).  Medical History: Past Medical History:  Diagnosis Date  . Anxiety   . Asthma   . Bronchitis   . Chronic cough   . Complication of anesthesia    nausea and vomiting  . COPD (chronic obstructive pulmonary disease) ([place])   . Coronary artery disease   . Depression   . Diabetes mellitus without complication ([place])   . Diverticulosis   . Dyspnea   . Dysrhythmia   . Environmental and seasonal allergies   . Hypertension   . Lower extremity edema   . Tremors of nervous system   . Wheezing       Assessment: 63 year old female w/ recent cardiac cath and discharge 12/7. Returns with chest pain and SOB. Pharmacy consulted to initiate and monitor heparin for CP.  Pt not on anticoagulants PTA per med list.  Goal of Therapy:  Heparin level 0.3-0.7 units/ml Monitor platelets by anticoagulation protocol: Yes   Plan:  12/9 1200 HL 0.19 subtherapeutic. Heparin 2000 unit bolus followed by increase in heparin drip to 1050 units/hr. HL at 2000. CBC daily while on heparin drip.  12/9:  HL @ 2045 = 0.3  Will continue pt on current rate and recheck HL on 12/10 @ 0300.   12/10 @ 0253 HL = 0.37, therapeutic.  Continue current rate and check HL daily 12/11 @ 0456 HL = 0.36, therapeutic.  Continue current rate and check HL daily.  CBC stable.    Ena Dawley, PharmD 02/21/2019,5:48 AM

## 2019-02-21 NOTE — Progress Notes (Signed)
Cardiovascular and Pulmonary Nurse Navigator Note:    63 year old female with history of significant chronic obstructive pulmonary disease, type 2 diabetes mellitus, hypertension, coronary artery disease recently admitted with dx of STEMI due to SCAD to distal LAD.  Patient presented to the ED wit chest pain / tightness radiating to both her shoulders and back.  Patient described this pain as similar to her recent STEMI when she was admitted last week.  During the previous admission patient underwent Cardiac Cath which revealed patent stents to mid, proximal right coronary artery, ostial right coronary artery to proximal coronary artery and mid left anterior descending artery which was 70% stenosed.  Patient was also found to have spontaneous coronary artery dissection mid to distal left anterior descending artery.  Echo revealed EF of 40-45%.  Patient reports taking 3 nitroglycerin tablets prior to coming to hospital.    Patient admitted with diagnosis of chest pain with elevated troponin.  Dr. Saralyn Pilar consulted.  Patient was treated with conservative therapy with heparin drip.  In addition, patient on Aspirin, Plavix, and Zocor.  Patient is allergic to Lipitor.  No Beta Blocker due to low blood pressure.    During last admission when patient had STEMI relating to SCAD, Dr. Saralyn Pilar did not refer patient to outpatient Cardiac Rehab, as Dr. Saralyn Pilar indicated SCAD is not the same etiology as MI from ACS.  Today Dr. Eloise Levels, Hospitalist, referred patient to outpatient Cardiac Rehab at [facility].  This RN reached out via secure chat to Dr. Saralyn Pilar to inform him of Dr. Marshia Ly referral to Cardiac Rehab for this patient.  Patient is for discharge home today.  Dr. Saralyn Pilar would like to cancel order for Cardiac Rehab at this time. Plan is for ongoing outpatient evaluation and treatment of patient's chest pain / angina.  Patient contacted by phone and informed of this.  Patient verbalized understanding and  appreciative of my call.    Roanna Epley, RN, BSN, [facility]  Cardiovascular & Pulmonary Nurse Navigator  Direct Line: [phone]  Department Phone #: [phone] Fax: [phone]  Email Address: [email or web address]

## 2019-02-21 NOTE — Progress Notes (Signed)
[facility] Cardiology  SUBJECTIVE: Patient laying in bed, reports feeling much better, denies chest pain   Vitals:   02/20/19 1453 02/20/19 1943 02/21/19 0436 02/21/19 0733  BP: 95/71 103/71 105/67 103/64  Pulse: 72 78 60 (!) 58  Resp: 18 16 18 17   Temp: 98 F (36.7 C) 97.9 F (36.6 C) 97.8 F (36.6 C) 98.1 F (36.7 C)  TempSrc: Oral Oral Oral Oral  SpO2: 97% 96% 96% 97%  Weight:   90.1 kg   Height:         Intake/Output Summary (Last 24 hours) at 02/21/2019 0851 Last data filed at 02/21/2019 0438 Gross per 24 hour  Intake 540.46 ml  Output 1300 ml  Net -759.54 ml      PHYSICAL EXAM  General: Well developed, well nourished, in no acute distress HEENT:  Normocephalic and atramatic Neck:  No JVD.  Lungs: Clear bilaterally to auscultation and percussion. Heart: HRRR . Normal S1 and S2 without gallops or murmurs.  Abdomen: Bowel sounds are positive, abdomen soft and non-tender  Msk:  Back normal, normal gait. Normal strength and tone for age. Extremities: No clubbing, cyanosis or edema.   Neuro: Alert and oriented X 3. Psych:  Good affect, responds appropriately   LABS: Basic Metabolic Panel: Recent Labs    02/19/19 0515 02/20/19 0253  NA 137 138  K 3.9 4.3  CL 102 103  CO2 26 26  GLUCOSE 106* 98  BUN 12 10  CREATININE 0.65 0.59  CALCIUM 8.7* 8.8*   Liver Function Tests: Recent Labs    02/19/19 0515 02/20/19 0253  AST 27 47*  ALT 22 23  ALKPHOS 100 92  BILITOT 0.8 0.7  PROT 6.7 6.5  ALBUMIN 3.8 3.5   No results for input(s): LIPASE, AMYLASE in the last 72 hours. CBC: Recent Labs    02/20/19 0253 02/21/19 0456  WBC 5.9 5.7  HGB 11.8* 12.3  HCT 36.4 37.5  MCV 90.8 88.4  PLT 235 242   Cardiac Enzymes: No results for input(s): CKTOTAL, CKMB, CKMBINDEX, TROPONINI in the last 72 hours. BNP: Invalid input(s): POCBNP D-Dimer: No results for input(s): DDIMER in the last 72 hours. Hemoglobin A1C: Recent Labs    02/20/19 0253  HGBA1C 5.8*    Fasting Lipid Panel: No results for input(s): CHOL, HDL, LDLCALC, TRIG, CHOLHDL, LDLDIRECT in the last 72 hours. Thyroid Function Tests: Recent Labs    02/20/19 0253  TSH 1.324   Anemia Panel: Recent Labs    02/19/19 0849  VITAMINB12 1,183*    No results found.   Echo LVEF 40 to 45%  TELEMETRY: Sinus rhythm:  ASSESSMENT AND PLAN:  Active Problems:   Chest pain   Elevated troponin   Spontaneous dissection of coronary artery   Fatigue   Neuropathy    1.  Chest pain, with typical and atypical features, nondiagnostic ECG, with elevated troponin that is trending down (2199, 1792).  Bone and on 02/15/2019 was 6987.  Patient denies chest pain today, reports feeling much better. 2.  Anterior STEMI 02/13/2019 with spontaneous coronary artery dissection mid LAD 3.  Mildly reduced left ventricular function, with LVEF 40 to 45%  Recommendations  1.  Medical therapy 2.  DC heparin 3.  Continue dual antiplatelet therapy uninterrupted for 1 year 4.  Isosorbide mononitrate increased to 60 mg daily 5.  May discharge home later today from cardiovascular perspective 6.  Follow-up with me in 1 week  Sign off for now, please call if any questions  Marcina Millard, MD, PhD, [facility] 02/21/2019 8:51 AM

## 2019-02-26 ENCOUNTER — Other Ambulatory Visit: Payer: Medicare HMO

## 2019-02-26 DIAGNOSIS — E785 Hyperlipidemia, unspecified: Secondary | ICD-10-CM | POA: Diagnosis not present

## 2019-02-26 DIAGNOSIS — I252 Old myocardial infarction: Secondary | ICD-10-CM | POA: Diagnosis not present

## 2019-02-26 DIAGNOSIS — I2510 Atherosclerotic heart disease of native coronary artery without angina pectoris: Secondary | ICD-10-CM | POA: Diagnosis not present

## 2019-02-26 DIAGNOSIS — I2102 ST elevation (STEMI) myocardial infarction involving left anterior descending coronary artery: Secondary | ICD-10-CM | POA: Diagnosis not present

## 2019-02-26 DIAGNOSIS — M542 Cervicalgia: Secondary | ICD-10-CM | POA: Diagnosis not present

## 2019-02-26 DIAGNOSIS — R110 Nausea: Secondary | ICD-10-CM | POA: Diagnosis not present

## 2019-03-20 DIAGNOSIS — R52 Pain, unspecified: Secondary | ICD-10-CM | POA: Diagnosis not present

## 2019-03-20 DIAGNOSIS — R5383 Other fatigue: Secondary | ICD-10-CM | POA: Diagnosis not present

## 2019-04-02 ENCOUNTER — Ambulatory Visit
Admission: RE | Admit: 2019-04-02 | Discharge: 2019-04-02 | Disposition: A | Discharge status: Home / Self Care | Payer: Medicare HMO | Source: Ambulatory Visit | Attending: Family Medicine | Admitting: Family Medicine

## 2019-04-02 DIAGNOSIS — R922 Inconclusive mammogram: Secondary | ICD-10-CM

## 2019-04-02 DIAGNOSIS — R928 Other abnormal and inconclusive findings on diagnostic imaging of breast: Secondary | ICD-10-CM | POA: Diagnosis not present

## 2019-04-02 DIAGNOSIS — N644 Mastodynia: Secondary | ICD-10-CM | POA: Diagnosis not present

## 2019-04-09 DIAGNOSIS — I25118 Atherosclerotic heart disease of native coronary artery with other forms of angina pectoris: Secondary | ICD-10-CM | POA: Diagnosis not present

## 2019-04-09 DIAGNOSIS — E785 Hyperlipidemia, unspecified: Secondary | ICD-10-CM | POA: Diagnosis not present

## 2019-04-09 DIAGNOSIS — R5383 Other fatigue: Secondary | ICD-10-CM | POA: Diagnosis not present

## 2019-04-09 DIAGNOSIS — I2102 ST elevation (STEMI) myocardial infarction involving left anterior descending coronary artery: Secondary | ICD-10-CM | POA: Diagnosis not present

## 2019-05-12 DIAGNOSIS — G894 Chronic pain syndrome: Secondary | ICD-10-CM | POA: Diagnosis not present

## 2019-05-12 DIAGNOSIS — I83813 Varicose veins of bilateral lower extremities with pain: Secondary | ICD-10-CM | POA: Diagnosis not present

## 2019-05-12 DIAGNOSIS — M791 Myalgia, unspecified site: Secondary | ICD-10-CM | POA: Diagnosis not present

## 2019-06-27 DIAGNOSIS — I83813 Varicose veins of bilateral lower extremities with pain: Secondary | ICD-10-CM | POA: Diagnosis not present

## 2019-06-27 DIAGNOSIS — Z7902 Long term (current) use of antithrombotics/antiplatelets: Secondary | ICD-10-CM | POA: Diagnosis not present

## 2019-06-27 DIAGNOSIS — M791 Myalgia, unspecified site: Secondary | ICD-10-CM | POA: Diagnosis not present

## 2019-06-27 DIAGNOSIS — Z79899 Other long term (current) drug therapy: Secondary | ICD-10-CM | POA: Diagnosis not present

## 2019-06-27 DIAGNOSIS — G894 Chronic pain syndrome: Secondary | ICD-10-CM | POA: Diagnosis not present

## 2019-06-27 DIAGNOSIS — I252 Old myocardial infarction: Secondary | ICD-10-CM | POA: Diagnosis not present

## 2019-07-01 ENCOUNTER — Telehealth (INDEPENDENT_AMBULATORY_CARE_PROVIDER_SITE_OTHER): Payer: Self-pay | Admitting: Nurse Practitioner

## 2019-07-01 NOTE — Telephone Encounter (Signed)
Patient called saying she has severe pain in her legs. She has an appt with FB on Monday 07/07/19. But wants to know if she could be seen sooner. This is a new patient referred by [facility]. Please advise.

## 2019-07-01 NOTE — Telephone Encounter (Signed)
Unfortunately the schedule is full the next week.  Also the patient is being seen for varicose veins so even if she was seen sooner any intervention that we would do (if possible) would be based on insurance approval.  Typically we also only prescribe ibuprofen for pain related to varicose veins so she can try 800 mg every 6 to 8 hours.  If her pain is too severe to wait she can see if her PCP can get her in tomorrow or she can be seen at urgent.

## 2019-07-01 NOTE — Telephone Encounter (Signed)
Spoke with the patient and gave the recommendations from Sheppard Plumber NP and per the patient she was given Flexeril and she has been taking this. Patient stated she was going to try taking the Ibuprofen that was suggested by the NP.

## 2019-07-01 NOTE — Telephone Encounter (Signed)
See notes below

## 2019-07-03 ENCOUNTER — Other Ambulatory Visit (INDEPENDENT_AMBULATORY_CARE_PROVIDER_SITE_OTHER): Payer: Self-pay | Admitting: Nurse Practitioner

## 2019-07-03 DIAGNOSIS — I83819 Varicose veins of unspecified lower extremities with pain: Secondary | ICD-10-CM

## 2019-07-07 ENCOUNTER — Ambulatory Visit (INDEPENDENT_AMBULATORY_CARE_PROVIDER_SITE_OTHER): Payer: Medicare HMO

## 2019-07-07 ENCOUNTER — Other Ambulatory Visit: Payer: Self-pay

## 2019-07-07 ENCOUNTER — Ambulatory Visit (INDEPENDENT_AMBULATORY_CARE_PROVIDER_SITE_OTHER): Payer: Medicare HMO | Admitting: Nurse Practitioner

## 2019-07-07 ENCOUNTER — Encounter (INDEPENDENT_AMBULATORY_CARE_PROVIDER_SITE_OTHER): Payer: Self-pay | Admitting: Nurse Practitioner

## 2019-07-07 VITALS — BP 115/77 | HR 76 | Resp 16 | Ht 62.0 in | Wt 186.4 lb

## 2019-07-07 DIAGNOSIS — G629 Polyneuropathy, unspecified: Secondary | ICD-10-CM | POA: Diagnosis not present

## 2019-07-07 DIAGNOSIS — I83819 Varicose veins of unspecified lower extremities with pain: Secondary | ICD-10-CM

## 2019-07-07 DIAGNOSIS — I10 Essential (primary) hypertension: Secondary | ICD-10-CM | POA: Diagnosis not present

## 2019-07-09 DIAGNOSIS — R05 Cough: Secondary | ICD-10-CM | POA: Diagnosis not present

## 2019-07-09 DIAGNOSIS — J019 Acute sinusitis, unspecified: Secondary | ICD-10-CM | POA: Diagnosis not present

## 2019-07-09 DIAGNOSIS — Z03818 Encounter for observation for suspected exposure to other biological agents ruled out: Secondary | ICD-10-CM | POA: Diagnosis not present

## 2019-07-09 DIAGNOSIS — R0600 Dyspnea, unspecified: Secondary | ICD-10-CM | POA: Diagnosis not present

## 2019-07-09 DIAGNOSIS — J029 Acute pharyngitis, unspecified: Secondary | ICD-10-CM | POA: Diagnosis not present

## 2019-07-10 ENCOUNTER — Encounter (INDEPENDENT_AMBULATORY_CARE_PROVIDER_SITE_OTHER): Payer: Self-pay | Admitting: Nurse Practitioner

## 2019-07-10 NOTE — Progress Notes (Signed)
Subjective:    Patient ID: Courtney Blanchard, female    DOB: 1956-02-18, 64 y.o.   MRN: [id number] Chief Complaint  Patient presents with  . New Patient (Initial Visit)    ref [place] varicose veins    The patient is seen for evaluation of symptomatic varicose veins. The patient relates burning and stinging which worsened steadily throughout the course of the day, particularly with standing. The patient also notes an aching and throbbing pain over the varicosities, particularly with prolonged dependent positions. The symptoms are significantly improved with elevation.  The patient also notes that during hot weather the symptoms are greatly intensified. The patient states the pain from the varicose veins interferes with work, daily exercise, shopping and household maintenance. At this point, the symptoms are persistent and severe enough that they're having a negative impact on lifestyle and are interfering with daily activities.  There is no history of DVT, PE or superficial thrombophlebitis. There is no history of ulceration or hemorrhage.  Patient has had a previous ablation/stripping years ago.  The patient is unsure of which.  The patient has been wearing medical grade 1 compression stockings for a number of years since her procedure.  Stockings have been helpful but recently the pain has worsened despite compression.  Patient has pain that she also describes as pain that travels and is almost constant.  Also her restless leg syndrome has been much worse.  Today the patient has no evidence of DVT or superficial venous thrombosis bilaterally.  The patient has venous reflux in the right great saphenous vein starting at the proximal thigh extending to the mid thigh.  The vein diameters measure from 0.35 cm to 0.38.  There is a noted large varicosity off of the mid great saphenous vein as well.  The left lower extremity has evidence of a prior ablation or stripping in the great saphenous vein.   However, there is a great cluster of varicosities seen just off of the saphenofemoral junction within the groin..   Review of Systems  Cardiovascular: Positive for leg swelling.       Large varicosities between 3 to 4 mm in the bilateral lower extremities.  Very large cluster in the left groin area  Skin: Positive for rash.  Neurological: Positive for numbness.       Tenderness on palpation       Objective:   Physical Exam Vitals reviewed.  Constitutional:      Appearance: Normal appearance.  Cardiovascular:     Rate and Rhythm: Normal rate and regular rhythm.     Pulses: Normal pulses.     Heart sounds: Normal heart sounds.  Pulmonary:     Effort: Pulmonary effort is normal.     Breath sounds: Normal breath sounds.  Skin:    Capillary Refill: Capillary refill takes less than 2 seconds.  Neurological:     Mental Status: She is alert and oriented to person, place, and time.  Psychiatric:        Mood and Affect: Mood normal.        Behavior: Behavior normal.        Thought Content: Thought content normal.        Judgment: Judgment normal.     BP 115/77 (BP Location: Left Arm)   Pulse 76   Resp 16   Ht 5\' 2"  (1.575 m)   Wt 186 lb 6.4 oz (84.6 kg)   BMI 34.09 kg/m   Past Medical History:  Diagnosis Date  .  Anxiety   . Asthma   . Bronchitis   . Chronic cough   . Complication of anesthesia    nausea and vomiting  . COPD (chronic obstructive pulmonary disease) ([facility])   . Coronary artery disease   . Depression   . Diabetes mellitus without complication ([facility])   . Diverticulosis   . Dyspnea   . Dysrhythmia   . Environmental and seasonal allergies   . Hypertension   . Lower extremity edema   . Tremors of nervous system   . Wheezing     Social History   Socioeconomic History  . Marital status: Married    Spouse name: Not on file  . Number of children: Not on file  . Years of education: Not on file  . Highest education level: Not on file  Occupational  History  . Not on file  Tobacco Use  . Smoking status: Never Smoker  . Smokeless tobacco: Never Used  Substance and Sexual Activity  . Alcohol use: No  . Drug use: No  . Sexual activity: Not Currently  Other Topics Concern  . Not on file  Social History Narrative  . Not on file   Social Determinants of Health   Financial Resource Strain: Low Risk   . Difficulty of Paying Living Expenses: Not hard at all  Food Insecurity: No Food Insecurity  . Worried About Programme researcher, broadcasting/film/video in the Last Year: Never true  . Ran Out of Food in the Last Year: Never true  Transportation Needs: No Transportation Needs  . Lack of Transportation (Medical): No  . Lack of Transportation (Non-Medical): No  Physical Activity: Inactive  . Days of Exercise per Week: 0 days  . Minutes of Exercise per Session: 0 min  Stress: Stress Concern Present  . Feeling of Stress : To some extent  Social Connections: Unknown  . Frequency of Communication with Friends and Family: Once a week  . Frequency of Social Gatherings with Friends and Family: Once a week  . Attends Religious Services: Not on file  . Active Member of Clubs or Organizations: Not on file  . Attends Banker Meetings: Not on file  . Marital Status: Not on file  Intimate Partner Violence: Not At Risk  . Fear of Current or Ex-Partner: No  . Emotionally Abused: No  . Physically Abused: No  . Sexually Abused: No    Past Surgical History:  Procedure Laterality Date  . ABDOMINAL HYSTERECTOMY    . BARIATRIC SURGERY    . CARDIAC CATHETERIZATION     with stent  . CHOLECYSTECTOMY    . COLON SURGERY    . LEFT HEART CATH AND CORONARY ANGIOGRAPHY N/A 01/28/2018   Procedure: LEFT HEART CATH AND CORONARY ANGIOGRAPHY;  Surgeon: Dalia Heading, MD;  Location: [facility] INVASIVE CV LAB;  Service: Cardiovascular;  Laterality: N/A;  . LEFT HEART CATH AND CORONARY ANGIOGRAPHY N/A 02/13/2019   Procedure: LEFT HEART CATH AND CORONARY ANGIOGRAPHY;   Surgeon: Marcina Millard, MD;  Location: [facility] INVASIVE CV LAB;  Service: Cardiovascular;  Laterality: N/A;    Family History  Problem Relation Age of Onset  . Hypertension Mother   . Heart disease Father   . CAD Father   . Heart disease Sister   . Stroke Sister   . Breast cancer Neg Hx     Allergies  Allergen Reactions  . Amoxicillin     Has patient had a PCN reaction causing immediate rash, facial/tongue/throat swelling, SOB or lightheadedness  with hypotension: Yes Has patient had a PCN reaction causing severe rash involving mucus membranes or skin necrosis: No Has patient had a PCN reaction that required hospitalization: No Has patient had a PCN reaction occurring within the last 10 years: No If all of the above answers are "NO", then may proceed with Cephalosporin use.  Sore throat   . Atorvastatin     Tremors        Assessment & Plan:   1. Varicose veins with pain Recommend  I have reviewed my previous  discussion with the patient regarding  varicose veins and why they cause symptoms. Patient will continue  wearing graduated compression stockings class 1 on a daily basis, beginning first thing in the morning and removing them in the evening.    In addition, behavioral modification including elevation during the day was again discussed and this will continue.  The patient has utilized over the counter pain medications and has been exercising.  However, at this time conservative therapy has not alleviated the patient's symptoms of leg pain and swelling  Recommend: laser ablation of the right great saphenous veins to eliminate the symptoms of pain and swelling of the lower extremities caused by the severe superficial venous reflux disease.   2. Essential hypertension Blood pressure control is important for vascular health.  Patient appropriate medications.  No changes made today.  3. Neuropathy While varicose veins do not cause neuropathy concern for worsening  some symptoms.  There also will worsen restless leg symptoms.  Patient will remain on her medications no changes made today.   Current Outpatient Medications on File Prior to Visit  Medication Sig Dispense Refill  . acetaminophen (TYLENOL) 500 MG tablet Take 500-1,000 mg by mouth 2 (two) times daily as needed for moderate pain or headache.     . ALPRAZolam (XANAX XR) 1 MG 24 hr tablet Take 1 mg by mouth daily.    Marland Kitchen aspirin 81 MG tablet Take 81 mg by mouth daily.     . clopidogrel (PLAVIX) 75 MG tablet Take 1 tablet (75 mg total) by mouth daily. 30 tablet 0  . DULoxetine (CYMBALTA) 20 MG capsule Take 20 mg by mouth daily.    Marland Kitchen gabapentin (NEURONTIN) 300 MG capsule Take 600 mg by mouth at bedtime.     Marland Kitchen guaiFENesin (MUCINEX) 600 MG 12 hr tablet Take 1 tablet (600 mg total) by mouth 2 (two) times daily. 30 tablet 0  . isosorbide mononitrate (IMDUR) 60 MG 24 hr tablet Take 1 tablet (60 mg total) by mouth daily. 30 tablet 0  . montelukast (SINGULAIR) 10 MG tablet Take 10 mg by mouth daily.     . nitroGLYCERIN (NITROSTAT) 0.4 MG SL tablet Place 1 tablet (0.4 mg total) under the tongue every 5 (five) minutes as needed for chest pain. 30 tablet 0  . rOPINIRole (REQUIP) 0.25 MG tablet     . simvastatin (ZOCOR) 40 MG tablet Take 1 tablet (40 mg total) by mouth daily at 6 PM. (Patient not taking: Reported on 07/07/2019) 30 tablet 0   No current facility-administered medications on file prior to visit.    There are no Patient Instructions on file for this visit. No follow-ups on file.   Kris Hartmann, NP

## 2019-08-12 ENCOUNTER — Other Ambulatory Visit: Payer: Self-pay

## 2019-08-12 ENCOUNTER — Ambulatory Visit (INDEPENDENT_AMBULATORY_CARE_PROVIDER_SITE_OTHER): Payer: Medicare HMO | Admitting: Vascular Surgery

## 2019-08-12 ENCOUNTER — Encounter (INDEPENDENT_AMBULATORY_CARE_PROVIDER_SITE_OTHER): Payer: Self-pay | Admitting: Vascular Surgery

## 2019-08-12 DIAGNOSIS — I83811 Varicose veins of right lower extremities with pain: Secondary | ICD-10-CM

## 2019-08-12 NOTE — Progress Notes (Signed)
Courtney Blanchard is a 64 y.o. female who presents with symptomatic venous reflux  Past Medical History:  Diagnosis Date   Anxiety    Asthma    Bronchitis    Chronic cough    Complication of anesthesia    nausea and vomiting   COPD (chronic obstructive pulmonary disease) ([facility])    Coronary artery disease    Depression    Diabetes mellitus without complication ([facility])    Diverticulosis    Dyspnea    Dysrhythmia    Environmental and seasonal allergies    Hypertension    Lower extremity edema    Tremors of nervous system    Wheezing     Past Surgical History:  Procedure Laterality Date   ABDOMINAL HYSTERECTOMY     BARIATRIC SURGERY     CARDIAC CATHETERIZATION     with stent   CHOLECYSTECTOMY     COLON SURGERY     LEFT HEART CATH AND CORONARY ANGIOGRAPHY N/A 01/28/2018   Procedure: LEFT HEART CATH AND CORONARY ANGIOGRAPHY;  Surgeon: Dalia Heading, MD;  Location: [facility] INVASIVE CV LAB;  Service: Cardiovascular;  Laterality: N/A;   LEFT HEART CATH AND CORONARY ANGIOGRAPHY N/A 02/13/2019   Procedure: LEFT HEART CATH AND CORONARY ANGIOGRAPHY;  Surgeon: Marcina Millard, MD;  Location: [facility] INVASIVE CV LAB;  Service: Cardiovascular;  Laterality: N/A;     Current Outpatient Medications:    acetaminophen (TYLENOL) 500 MG tablet, Take 500-1,000 mg by mouth 2 (two) times daily as needed for moderate pain or headache. , Disp: , Rfl:    ALPRAZolam (XANAX XR) 1 MG 24 hr tablet, Take 1 mg by mouth daily., Disp: , Rfl:    aspirin 81 MG tablet, Take 81 mg by mouth daily. , Disp: , Rfl:    clopidogrel (PLAVIX) 75 MG tablet, Take 1 tablet (75 mg total) by mouth daily., Disp: 30 tablet, Rfl: 0   DULoxetine (CYMBALTA) 20 MG capsule, Take 20 mg by mouth daily., Disp: , Rfl:    gabapentin (NEURONTIN) 300 MG capsule, Take 600 mg by mouth at bedtime. , Disp: , Rfl:    isosorbide mononitrate (IMDUR) 60 MG 24 hr tablet, Take 1 tablet (60 mg total) by mouth daily.,  Disp: 30 tablet, Rfl: 0   montelukast (SINGULAIR) 10 MG tablet, Take 10 mg by mouth daily. , Disp: , Rfl:    nitroGLYCERIN (NITROSTAT) 0.4 MG SL tablet, Place 1 tablet (0.4 mg total) under the tongue every 5 (five) minutes as needed for chest pain., Disp: 30 tablet, Rfl: 0   rOPINIRole (REQUIP) 0.25 MG tablet, , Disp: , Rfl:    guaiFENesin (MUCINEX) 600 MG 12 hr tablet, Take 1 tablet (600 mg total) by mouth 2 (two) times daily. (Patient not taking: Reported on 08/12/2019), Disp: 30 tablet, Rfl: 0   simvastatin (ZOCOR) 40 MG tablet, Take 1 tablet (40 mg total) by mouth daily at 6 PM. (Patient not taking: Reported on 08/12/2019), Disp: 30 tablet, Rfl: 0  Allergies  Allergen Reactions   Amoxicillin     Has patient had a PCN reaction causing immediate rash, facial/tongue/throat swelling, SOB or lightheadedness with hypotension: Yes Has patient had a PCN reaction causing severe rash involving mucus membranes or skin necrosis: No Has patient had a PCN reaction that required hospitalization: No Has patient had a PCN reaction occurring within the last 10 years: No If all of the above answers are "NO", then may proceed with Cephalosporin use.  Sore throat    Atorvastatin  Tremors      Varicose veins of leg with pain, right     PLAN: The patient's right lower extremity was sterilely prepped and draped. The ultrasound machine was used to visualize the saphenous vein throughout its course. A segment just above the knee was selected for access. The saphenous vein was accessed with a mild amount of difficulty using ultrasound guidance with a micropuncture needle. A 0.018 wire was then placed beyond the saphenofemoral junction and the needle was removed. The 65 cm sheath was then placed over the wire and the wire and dilator were removed. The laser fiber was then placed through the sheath and its tip was placed approximately 4-5 centimeters below the saphenofemoral junction. Tumescent anesthesia  was then created with a dilute lidocaine solution. Laser energy was then delivered with constant withdrawal of the sheath and laser fiber. Approximately 1168 joules of energy were delivered over a length of 27 centimeters using a 1470 Hz VenaCure machine at 7 W. Sterile dressings were placed. The patient tolerated the procedure well without obvious complications.   Follow-up in 1 week with post-laser duplex.

## 2019-08-14 ENCOUNTER — Ambulatory Visit (INDEPENDENT_AMBULATORY_CARE_PROVIDER_SITE_OTHER): Payer: Medicare HMO

## 2019-08-14 ENCOUNTER — Other Ambulatory Visit: Payer: Self-pay

## 2019-08-14 DIAGNOSIS — I83811 Varicose veins of right lower extremities with pain: Secondary | ICD-10-CM | POA: Diagnosis not present

## 2019-09-12 ENCOUNTER — Ambulatory Visit (INDEPENDENT_AMBULATORY_CARE_PROVIDER_SITE_OTHER): Payer: Medicare HMO | Admitting: Vascular Surgery

## 2019-09-12 ENCOUNTER — Other Ambulatory Visit: Payer: Self-pay

## 2019-09-12 ENCOUNTER — Encounter (INDEPENDENT_AMBULATORY_CARE_PROVIDER_SITE_OTHER): Payer: Self-pay | Admitting: Vascular Surgery

## 2019-09-12 VITALS — BP 121/79 | HR 69 | Ht 62.0 in | Wt 183.0 lb

## 2019-09-12 DIAGNOSIS — N809 Endometriosis, unspecified: Secondary | ICD-10-CM | POA: Insufficient documentation

## 2019-09-12 DIAGNOSIS — G43909 Migraine, unspecified, not intractable, without status migrainosus: Secondary | ICD-10-CM | POA: Insufficient documentation

## 2019-09-12 DIAGNOSIS — I10 Essential (primary) hypertension: Secondary | ICD-10-CM

## 2019-09-12 DIAGNOSIS — J449 Chronic obstructive pulmonary disease, unspecified: Secondary | ICD-10-CM | POA: Diagnosis not present

## 2019-09-12 DIAGNOSIS — J45909 Unspecified asthma, uncomplicated: Secondary | ICD-10-CM | POA: Insufficient documentation

## 2019-09-12 DIAGNOSIS — Z955 Presence of coronary angioplasty implant and graft: Secondary | ICD-10-CM | POA: Insufficient documentation

## 2019-09-12 DIAGNOSIS — T7840XA Allergy, unspecified, initial encounter: Secondary | ICD-10-CM | POA: Insufficient documentation

## 2019-09-12 DIAGNOSIS — I83813 Varicose veins of bilateral lower extremities with pain: Secondary | ICD-10-CM

## 2019-09-12 DIAGNOSIS — R030 Elevated blood-pressure reading, without diagnosis of hypertension: Secondary | ICD-10-CM | POA: Insufficient documentation

## 2019-09-12 DIAGNOSIS — E119 Type 2 diabetes mellitus without complications: Secondary | ICD-10-CM | POA: Insufficient documentation

## 2019-09-12 NOTE — Progress Notes (Signed)
MRN : [id number]  Lenore CordiaVickie A Wittke is a 64 y.o. (08-30-1955) female who presents with chief complaint of  Chief Complaint  Patient presents with  . Follow-up    4 wk post laser  .  History of Present Illness: Patient returns today in follow up of her venous disease.  Her right leg is feeling much better after right great saphenous vein laser ablation about a month ago.  She had a successful ablation without DVT.  She does have some large residual varicosities in the right leg that are still painful and bothersome on a daily basis.  She is also had a previous stripping and ligation of the left great saphenous vein but has very large varicosities which are painful and bothersome to her daily on the left leg.  Current Outpatient Medications  Medication Sig Dispense Refill  . acetaminophen (TYLENOL) 500 MG tablet Take 500-1,000 mg by mouth 2 (two) times daily as needed for moderate pain or headache.     . ALPRAZolam (XANAX XR) 1 MG 24 hr tablet Take 1 mg by mouth daily.    Marland Kitchen. aspirin 81 MG tablet Take 81 mg by mouth daily.     . baclofen (LIORESAL) 10 MG tablet     . clopidogrel (PLAVIX) 75 MG tablet Take 1 tablet (75 mg total) by mouth daily. 30 tablet 0  . cyclobenzaprine (FLEXERIL) 5 MG tablet     . DULoxetine (CYMBALTA) 20 MG capsule Take 20 mg by mouth daily.    Marland Kitchen. gabapentin (NEURONTIN) 300 MG capsule Take 600 mg by mouth at bedtime.     Marland Kitchen. guaiFENesin (MUCINEX) 600 MG 12 hr tablet Take 1 tablet (600 mg total) by mouth 2 (two) times daily. 30 tablet 0  . isosorbide mononitrate (IMDUR) 60 MG 24 hr tablet Take 1 tablet (60 mg total) by mouth daily. 30 tablet 0  . montelukast (SINGULAIR) 10 MG tablet Take 10 mg by mouth daily.     . nitroGLYCERIN (NITROSTAT) 0.4 MG SL tablet Place 1 tablet (0.4 mg total) under the tongue every 5 (five) minutes as needed for chest pain. 30 tablet 0  . rOPINIRole (REQUIP) 0.25 MG tablet     . simvastatin (ZOCOR) 40 MG tablet Take 1 tablet (40 mg total) by  mouth daily at 6 PM. 30 tablet 0   No current facility-administered medications for this visit.    Past Medical History:  Diagnosis Date  . Anxiety   . Asthma   . Bronchitis   . Chronic cough   . Complication of anesthesia    nausea and vomiting  . COPD (chronic obstructive pulmonary disease) (HCC)   . Coronary artery disease   . Depression   . Diabetes mellitus without complication (HCC)   . Diverticulosis   . Dyspnea   . Dysrhythmia   . Environmental and seasonal allergies   . Hypertension   . Lower extremity edema   . Tremors of nervous system   . Wheezing     Past Surgical History:  Procedure Laterality Date  . ABDOMINAL HYSTERECTOMY    . BARIATRIC SURGERY    . CARDIAC CATHETERIZATION     with stent  . CHOLECYSTECTOMY    . COLON SURGERY    . LEFT HEART CATH AND CORONARY ANGIOGRAPHY N/A 01/28/2018   Procedure: LEFT HEART CATH AND CORONARY ANGIOGRAPHY;  Surgeon: Dalia HeadingFath, Kenneth A, MD;  Location: [facility];  Service: Cardiovascular;  Laterality: N/A;  . LEFT HEART CATH AND CORONARY  ANGIOGRAPHY N/A 02/13/2019   Procedure: LEFT HEART CATH AND CORONARY ANGIOGRAPHY;  Surgeon: Marcina Millard, MD;  Location: [facility];  Service: Cardiovascular;  Laterality: N/A;     Social History   Tobacco Use  . Smoking status: Never Smoker  . Smokeless tobacco: Never Used  Vaping Use  . Vaping Use: Never used  Substance Use Topics  . Alcohol use: No  . Drug use: No      Family History  Problem Relation Age of Onset  . Hypertension Mother   . Heart disease Father   . CAD Father   . Heart disease Sister   . Stroke Sister   . Breast cancer Neg Hx      Allergies  Allergen Reactions  . Amoxicillin     Has patient had a PCN reaction causing immediate rash, facial/tongue/throat swelling, SOB or lightheadedness with hypotension: Yes Has patient had a PCN reaction causing severe rash involving mucus membranes or skin necrosis: No Has patient had a  PCN reaction that required hospitalization: No Has patient had a PCN reaction occurring within the last 10 years: No If all of the above answers are "NO", then may proceed with Cephalosporin use.  Sore throat   . Atorvastatin     Tremors      REVIEW OF SYSTEMS (Negative unless checked)  Constitutional: [] Weight loss  [] Fever  [] Chills Cardiac: [] Chest pain   [] Chest pressure   [] Palpitations   [] Shortness of breath when laying flat   [] Shortness of breath at rest   [] Shortness of breath with exertion. Vascular:  [x] Pain in legs with walking   [x] Pain in legs at rest   [] Pain in legs when laying flat   [] Claudication   [] Pain in feet when walking  [] Pain in feet at rest  [] Pain in feet when laying flat   [] History of DVT   [] Phlebitis   [x] Swelling in legs   [x] Varicose veins   [] Non-healing ulcers Pulmonary:   [] Uses home oxygen   [] Productive cough   [] Hemoptysis   [] Wheeze  [] COPD   [] Asthma Neurologic:  [] Dizziness  [] Blackouts   [] Seizures   [] History of stroke   [] History of TIA  [] Aphasia   [] Temporary blindness   [] Dysphagia   [] Weakness or numbness in arms   [] Weakness or numbness in legs Musculoskeletal:  [] Arthritis   [] Joint swelling   [] Joint pain   [] Low back pain Hematologic:  [] Easy bruising  [] Easy bleeding   [] Hypercoagulable state   [] Anemic   Gastrointestinal:  [] Blood in stool   [] Vomiting blood  [] Gastroesophageal reflux/heartburn   [] Abdominal pain Genitourinary:  [] Chronic kidney disease   [] Difficult urination  [] Frequent urination  [] Burning with urination   [] Hematuria Skin:  [] Rashes   [] Ulcers   [] Wounds Psychological:  [] History of anxiety   []  History of major depression.  Physical Examination  BP 121/79   Pulse 69   Ht 5\' 2"  (1.575 m)   Wt 183 lb (83 kg)   BMI 33.47 kg/m  Gen:  WD/WN, NAD Head: [place]/AT, No temporalis wasting. Ear/Nose/Throat: Hearing grossly intact, nares w/o erythema or drainage Eyes: Conjunctiva clear. Sclera non-icteric Neck:  Supple.  Trachea midline Pulmonary:  Good air movement, no use of accessory muscles.  Cardiac: RRR, no JVD Vascular:  Vessel Right Left  Radial Palpable Palpable                   Musculoskeletal: M/S 5/5 throughout.  No deformity or atrophy.  Residual varicosities in  the right leg measuring 3 to 4 mm. 4-5 millimeters varicosities in the left lower extremity.  1+ bilateral lower extremity edema. Neurologic: Sensation grossly intact in extremities.  Symmetrical.  Speech is fluent.  Psychiatric: Judgment intact, Mood & affect appropriate for pt's clinical situation. Dermatologic: No rashes or ulcers noted.  No cellulitis or open wounds.       Labs No results found for this or any previous visit (from the past 2160 hour(s)).  Radiology VAS [place] LASER ABLATION OF SUPERFICIAL VEI  Result Date: 08/19/2019  Lower Venous Study Indications: Post Ablation Rt GSV.  Performing Technologist: Debbe Bales RVS  Examination Guidelines: A complete evaluation includes B-mode imaging, spectral Doppler, color Doppler, and power Doppler as needed of all accessible portions of each vessel. Bilateral testing is considered an integral part of a complete examination. Limited examinations for reoccurring indications may be performed as noted. The reflux portion of the exam is performed with the patient in reverse Trendelenburg. Significant venous reflux is defined as ?500 ms in the superficial venous system, and >1 second in the deep venous system.  +---------+---------------+---------+-----------+----------+--------------+ RIGHT    CompressibilityPhasicitySpontaneityPropertiesThrombus Aging +---------+---------------+---------+-----------+----------+--------------+ CFV      Full           Yes      Yes                                 +---------+---------------+---------+-----------+----------+--------------+ SFJ      Full           Yes      Yes                                  +---------+---------------+---------+-----------+----------+--------------+ FV Prox  Full           Yes      Yes                                 +---------+---------------+---------+-----------+----------+--------------+ FV Mid   Full           Yes      Yes                                 +---------+---------------+---------+-----------+----------+--------------+ FV DistalFull           Yes      Yes                                 +---------+---------------+---------+-----------+----------+--------------+ PFV      Full           Yes      Yes                                 +---------+---------------+---------+-----------+----------+--------------+ POP      Full           Yes      Yes                                 +---------+---------------+---------+-----------+----------+--------------+    Right Reflux Technical Findings: The Right GSV appears to be  occluded via Ablation in the Right Thigh area.  Summary: Right: - There is no evidence of deep vein thrombosis in the lower extremity. - There is no evidence of chronic venous insufficiency. - There is no evidence of superficial venous thrombosis.  *See table(s) above for measurements and observations. Electronically signed by Festus Barren MD on 08/19/2019 at 5:01:27 PM.    Final     Assessment/Plan  Hypertension blood pressure control important in reducing the progression of atherosclerotic disease. On appropriate oral medications.   Varicose veins of leg with pain, bilateral The patient has undergone successful laser ablation of the right great saphenous vein but she does still have residual varicosities which cause pain.  Foam sclerotherapy would be an excellent option to treat the residual varicosities.  On the left leg, she has had a previous vein stripping but has very large varicosities are also causing pain.  Foam sclerotherapy would also be an excellent option to treat these varicosities on the left leg as well.  Risks  and benefits of sclerotherapy were discussed and she desire to proceed.    Festus Barren, MD  09/12/2019 12:39 PM    This note was created with Dragon medical transcription system.  Any errors from dictation are purely unintentional

## 2019-09-12 NOTE — Patient Instructions (Signed)
Sclerotherapy  Sclerotherapy is a procedure that is done to improve the appearance of varicose veins and spider veins and to help relieve aching, swelling, cramping, and pain in the legs. Varicose veins are veins that have become enlarged, bulging, and twisted due to a damaged valve that causes blood to collect (pool) in the veins. Spider veins are small varicose veins. Sclerotherapy usually works best for smaller spider and varicose veins. This procedure involves injecting a chemical into the vein to close it off. You may need more than one treatment to close a vein all the way. Sclerotherapy is usually performed on the legs because that is where varicose and spider veins most often occur. Tell a health care provider about:  Any allergies you have.  All medicines you are taking, including vitamins, herbs, eye drops, creams, and over-the-counter medicines.  Any blood disorders you have.  Any surgeries you have had.  Any medical conditions you have.  Whether you are pregnant or may be pregnant. What are the risks? Generally, this is a safe procedure. However, problems may occur, including:  Infection.  Bleeding.  Allergic reactions to medicines or dyes.  Blood clots.  Nerve damage.  Bruising and scarring.  Darkened skin around the area. What happens before the procedure?  Do not use lotions or creams on your legs unless your health care provider approves.  Follow instructions from your health care provider about eating and drinking restrictions.  Do not use any products that contain nicotine or tobacco, such as cigarettes and e-cigarettes. If you need help quitting, ask your health care provider.  Ask your health care provider about: ? Changing or stopping your regular medicines. This is especially important if you are taking diabetes medicines or blood thinners. ? Taking medicines such as aspirin and ibuprofen. These medicines can thin your blood. Do not take these  medicines before your procedure if your health care provider instructs you not to.  You may have an ultrasound of the affected area to check for blood clots and to check blood flow.  In rare cases, you may have an X-ray procedure to check how blood flows through your veins (angiogram). For an angiogram, a dye is injected to outline your veins on X-rays. What happens during the procedure?  To lower your risk of infection: ? Your health care team will wash or sanitize their hands. ? Your skin will be washed with soap. ? Hair may be removed from the treatment area.  A small, thin needle will be used to inject a chemical (sclerosant) into your varicose vein. The sclerosant will irritate the lining of the vein and cause the vein to close below the injection site. You may feel some stinging, burning, or irritation.  The injection may be repeated for more than one varicose vein.  The injection area will be wrapped with elastic bandages. The procedure may vary among health care providers and hospitals. What happens after the procedure?  Your injection area will be wrapped with elastic bandages. If there is bleeding, the bandages may be changed.  Do not drive until your health care provider approves. You may need to wait 1-2 days before driving.  You will need to wear compression stockings for about a week, or as long as your health care provider recommends. Summary  Sclerotherapy is a procedure that is done to improve the appearance of varicose veins and spider veins and to help relieve aching, swelling, cramping, and pain in the legs.  A small, thin needle is   used to inject a chemical (sclerosant) into a spider vein or varicose vein to close it off.  Elastic bandages will be wrapped around the injection area after the procedure. This information is not intended to replace advice given to you by your health care provider. Make sure you discuss any questions you have with your health care  provider. Document Revised: 06/21/2018 Document Reviewed: 04/18/2016 Elsevier Patient Education  2020 [employer]  

## 2019-09-12 NOTE — Assessment & Plan Note (Signed)
blood pressure control important in reducing the progression of atherosclerotic disease. On appropriate oral medications.  

## 2019-09-12 NOTE — Assessment & Plan Note (Signed)
The patient has undergone successful laser ablation of the right great saphenous vein but she does still have residual varicosities which cause pain.  Foam sclerotherapy would be an excellent option to treat the residual varicosities.  On the left leg, she has had a previous vein stripping but has very large varicosities are also causing pain.  Foam sclerotherapy would also be an excellent option to treat these varicosities on the left leg as well.  Risks and benefits of sclerotherapy were discussed and she desire to proceed.

## 2019-10-02 DIAGNOSIS — E119 Type 2 diabetes mellitus without complications: Secondary | ICD-10-CM | POA: Diagnosis not present

## 2019-10-03 DIAGNOSIS — Z0100 Encounter for examination of eyes and vision without abnormal findings: Secondary | ICD-10-CM | POA: Diagnosis not present

## 2019-10-23 DIAGNOSIS — I25118 Atherosclerotic heart disease of native coronary artery with other forms of angina pectoris: Secondary | ICD-10-CM | POA: Diagnosis not present

## 2019-10-23 DIAGNOSIS — E1165 Type 2 diabetes mellitus with hyperglycemia: Secondary | ICD-10-CM | POA: Diagnosis not present

## 2019-10-23 DIAGNOSIS — F439 Reaction to severe stress, unspecified: Secondary | ICD-10-CM | POA: Diagnosis not present

## 2019-10-23 DIAGNOSIS — E785 Hyperlipidemia, unspecified: Secondary | ICD-10-CM | POA: Diagnosis not present

## 2019-10-23 DIAGNOSIS — F3342 Major depressive disorder, recurrent, in full remission: Secondary | ICD-10-CM | POA: Diagnosis not present

## 2019-10-28 ENCOUNTER — Ambulatory Visit (INDEPENDENT_AMBULATORY_CARE_PROVIDER_SITE_OTHER): Payer: Medicare HMO | Admitting: Vascular Surgery

## 2019-11-03 DIAGNOSIS — E785 Hyperlipidemia, unspecified: Secondary | ICD-10-CM | POA: Diagnosis not present

## 2019-11-03 DIAGNOSIS — Z955 Presence of coronary angioplasty implant and graft: Secondary | ICD-10-CM | POA: Diagnosis not present

## 2019-11-03 DIAGNOSIS — I25118 Atherosclerotic heart disease of native coronary artery with other forms of angina pectoris: Secondary | ICD-10-CM | POA: Diagnosis not present

## 2019-11-03 DIAGNOSIS — R002 Palpitations: Secondary | ICD-10-CM | POA: Diagnosis not present

## 2019-11-03 DIAGNOSIS — I2102 ST elevation (STEMI) myocardial infarction involving left anterior descending coronary artery: Secondary | ICD-10-CM | POA: Diagnosis not present

## 2019-11-25 ENCOUNTER — Ambulatory Visit (INDEPENDENT_AMBULATORY_CARE_PROVIDER_SITE_OTHER): Payer: Medicare HMO | Admitting: Vascular Surgery

## 2019-12-09 ENCOUNTER — Ambulatory Visit (INDEPENDENT_AMBULATORY_CARE_PROVIDER_SITE_OTHER): Payer: Medicare HMO | Admitting: Vascular Surgery

## 2019-12-09 ENCOUNTER — Other Ambulatory Visit: Payer: Self-pay

## 2019-12-09 ENCOUNTER — Encounter (INDEPENDENT_AMBULATORY_CARE_PROVIDER_SITE_OTHER): Payer: Self-pay | Admitting: Vascular Surgery

## 2019-12-09 VITALS — BP 122/78 | HR 64 | Resp 16 | Wt 183.6 lb

## 2019-12-09 DIAGNOSIS — I83813 Varicose veins of bilateral lower extremities with pain: Secondary | ICD-10-CM

## 2019-12-09 DIAGNOSIS — I83812 Varicose veins of left lower extremities with pain: Secondary | ICD-10-CM | POA: Diagnosis not present

## 2019-12-09 NOTE — Progress Notes (Signed)
Courtney Blanchard is a 64 y.o.female who presents with painful varicose veins of the left leg  Past Medical History:  Diagnosis Date  . Anxiety   . Asthma   . Bronchitis   . Chronic cough   . Complication of anesthesia    nausea and vomiting  . COPD (chronic obstructive pulmonary disease) (HCC)   . Coronary artery disease   . Depression   . Diabetes mellitus without complication (HCC)   . Diverticulosis   . Dyspnea   . Dysrhythmia   . Environmental and seasonal allergies   . Hypertension   . Lower extremity edema   . Tremors of nervous system   . Wheezing     Past Surgical History:  Procedure Laterality Date  . ABDOMINAL HYSTERECTOMY    . BARIATRIC SURGERY    . CARDIAC CATHETERIZATION     with stent  . CHOLECYSTECTOMY    . COLON SURGERY    . LEFT HEART CATH AND CORONARY ANGIOGRAPHY N/A 01/28/2018   Procedure: LEFT HEART CATH AND CORONARY ANGIOGRAPHY;  Surgeon: Dalia Heading, MD;  Location: [facility];  Service: Cardiovascular;  Laterality: N/A;  . LEFT HEART CATH AND CORONARY ANGIOGRAPHY N/A 02/13/2019   Procedure: LEFT HEART CATH AND CORONARY ANGIOGRAPHY;  Surgeon: Marcina Millard, MD;  Location: [facility];  Service: Cardiovascular;  Laterality: N/A;    Current Outpatient Medications  Medication Sig Dispense Refill  . acetaminophen (TYLENOL) 500 MG tablet Take 500-1,000 mg by mouth 2 (two) times daily as needed for moderate pain or headache.     . ALPRAZolam (XANAX XR) 1 MG 24 hr tablet Take 1 mg by mouth daily.    Marland Kitchen aspirin 81 MG tablet Take 81 mg by mouth daily.     . baclofen (LIORESAL) 10 MG tablet     . clopidogrel (PLAVIX) 75 MG tablet Take 1 tablet (75 mg total) by mouth daily. 30 tablet 0  . cyclobenzaprine (FLEXERIL) 5 MG tablet     . DULoxetine (CYMBALTA) 20 MG capsule Take 20 mg by mouth daily.    Marland Kitchen gabapentin (NEURONTIN) 300 MG capsule Take 600 mg by mouth at bedtime.     Marland Kitchen guaiFENesin (MUCINEX) 600 MG 12 hr tablet Take 1  tablet (600 mg total) by mouth 2 (two) times daily. 30 tablet 0  . isosorbide mononitrate (IMDUR) 60 MG 24 hr tablet Take 1 tablet (60 mg total) by mouth daily. 30 tablet 0  . montelukast (SINGULAIR) 10 MG tablet Take 10 mg by mouth daily.     . nitroGLYCERIN (NITROSTAT) 0.4 MG SL tablet Place 1 tablet (0.4 mg total) under the tongue every 5 (five) minutes as needed for chest pain. 30 tablet 0  . rOPINIRole (REQUIP) 0.25 MG tablet     . simvastatin (ZOCOR) 40 MG tablet Take 1 tablet (40 mg total) by mouth daily at 6 PM. 30 tablet 0   No current facility-administered medications for this visit.    Allergies  Allergen Reactions  . Amoxicillin     Has patient had a PCN reaction causing immediate rash, facial/tongue/throat swelling, SOB or lightheadedness with hypotension: Yes Has patient had a PCN reaction causing severe rash involving mucus membranes or skin necrosis: No Has patient had a PCN reaction that required hospitalization: No Has patient had a PCN reaction occurring within the last 10 years: No If all of the above answers are "NO", then may proceed with Cephalosporin use.  Sore throat   . Atorvastatin  Tremors     Indication: Patient presents with symptomatic varicose veins of the left lower extremity.  Procedure: Foam sclerotherapy was performed on the left lower extremity. Using ultrasound guidance, 5 mL of foam Sotradecol was used to inject the varicosities of the left lower extremity. Compression wraps were placed. The patient tolerated the procedure well.

## 2019-12-10 DIAGNOSIS — Z1152 Encounter for screening for COVID-19: Secondary | ICD-10-CM | POA: Diagnosis not present

## 2019-12-10 DIAGNOSIS — Z03818 Encounter for observation for suspected exposure to other biological agents ruled out: Secondary | ICD-10-CM | POA: Diagnosis not present

## 2019-12-23 ENCOUNTER — Ambulatory Visit (INDEPENDENT_AMBULATORY_CARE_PROVIDER_SITE_OTHER): Payer: Medicare HMO | Admitting: Vascular Surgery

## 2020-01-27 ENCOUNTER — Ambulatory Visit (INDEPENDENT_AMBULATORY_CARE_PROVIDER_SITE_OTHER): Payer: Medicare HMO | Admitting: Vascular Surgery

## 2020-01-29 IMAGING — MG DIGITAL DIAGNOSTIC BILAT W/ TOMO W/ CAD
8 of 14 series · 8 of 40 positions shown · non-contrast
Comparison: None.

CLINICAL DATA: The patient complains of pain in the subareolar
region of the left breast.

EXAM:
DIGITAL DIAGNOSTIC BILATERAL MAMMOGRAM WITH CAD AND TOMO
ULTRASOUND LEFT BREAST

[L CC synth-2D (1 of 2)]
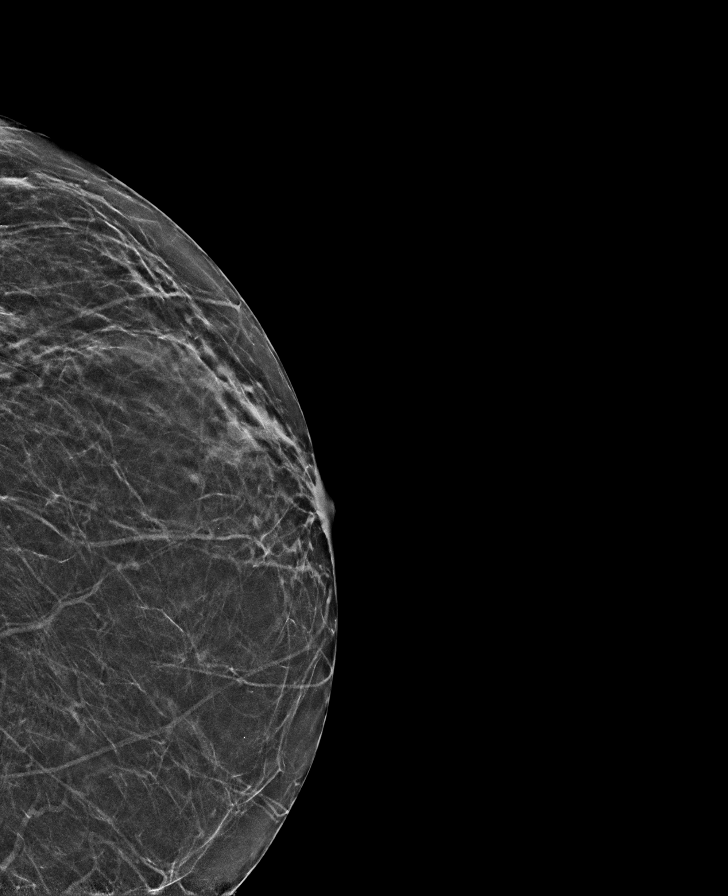

[R MLO synth-2D (1 of 2)]
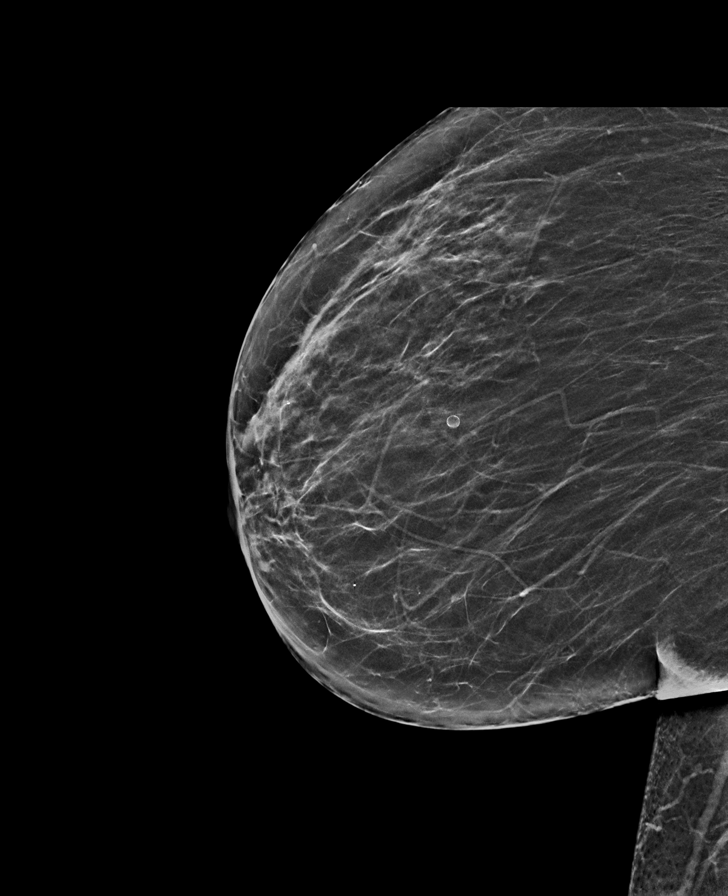

[R CC synth-2D]
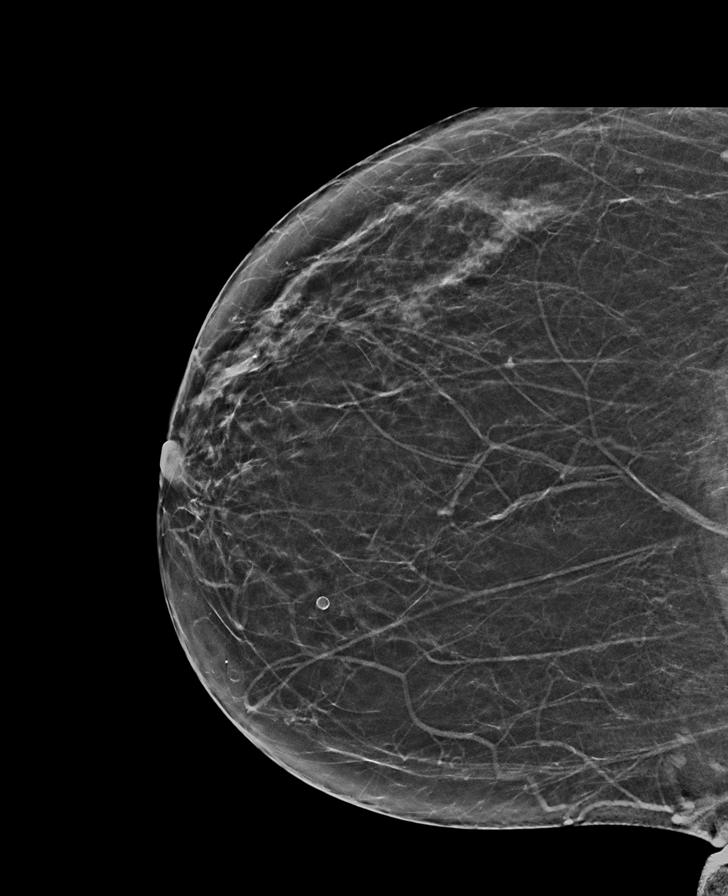

[L MLO synth-2D (1 of 2)]
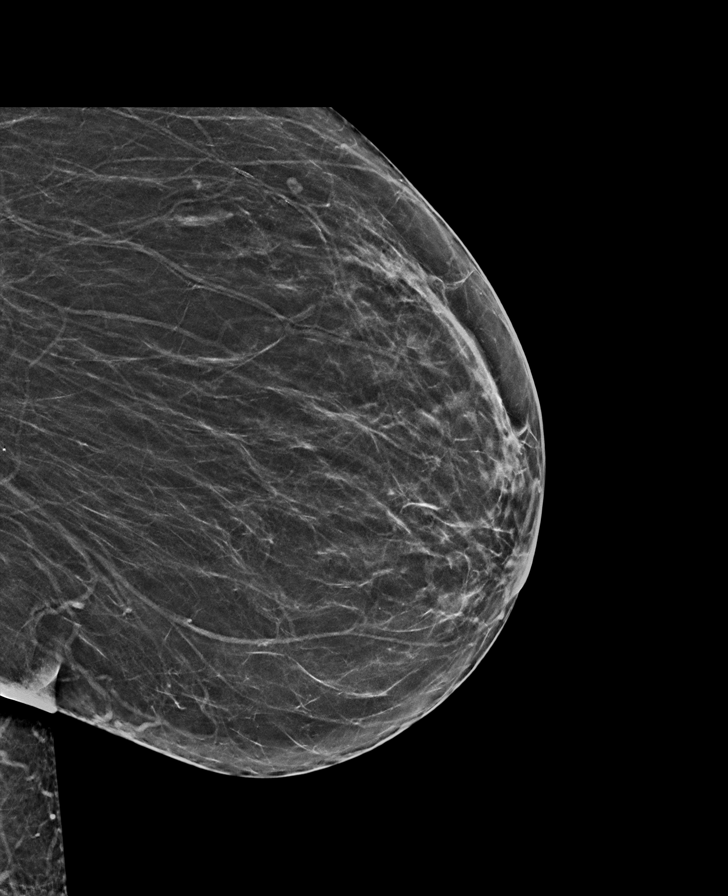

[R MLO synth-2D (2 of 2)]
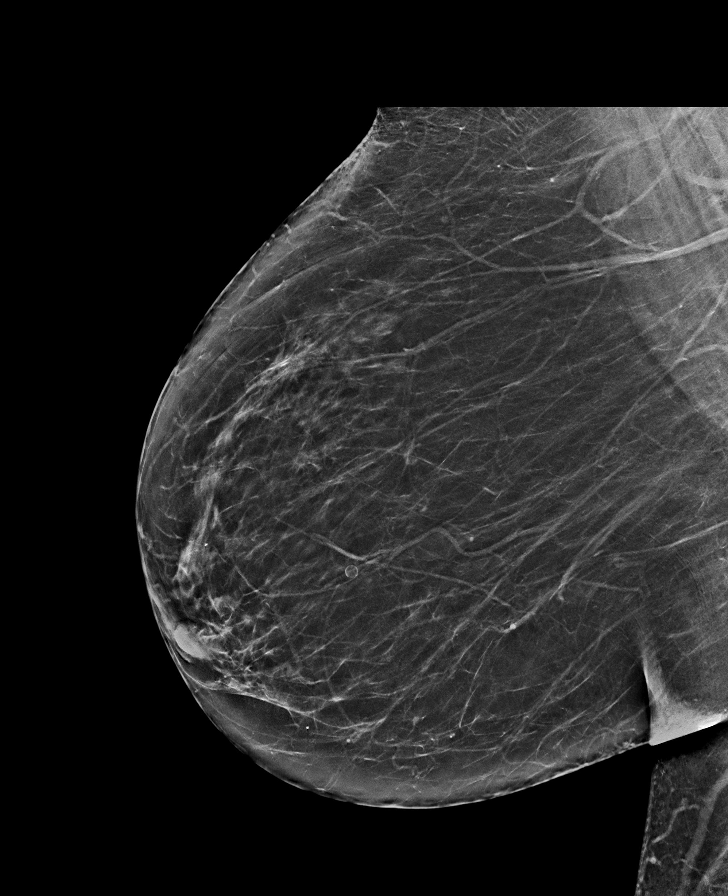

[L CC synth-2D (2 of 2)]
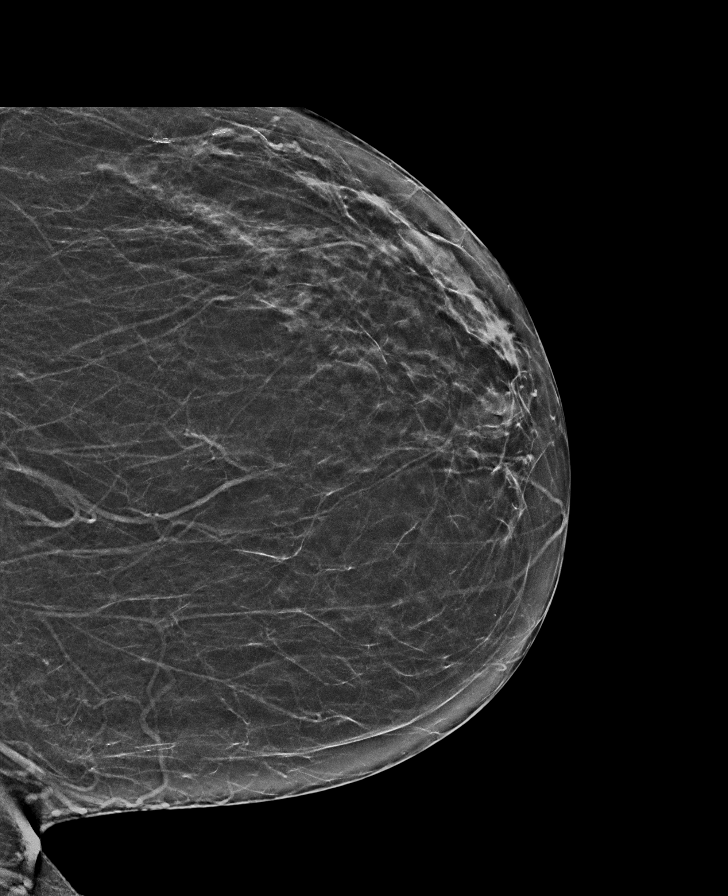

[L MLO synth-2D (2 of 2)]
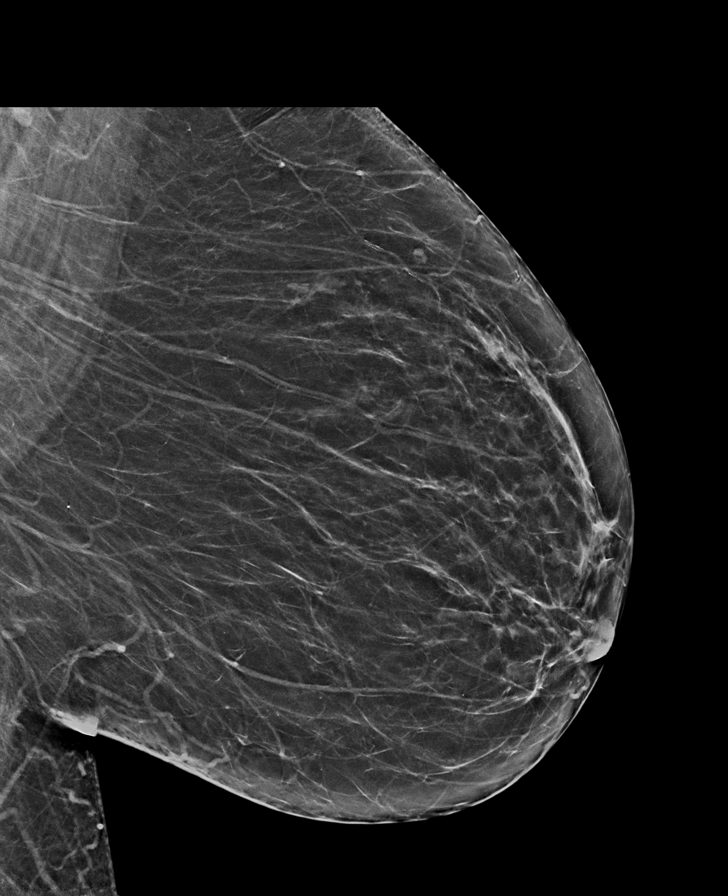

[R MLO tomo · tomo slice 29/56.0]
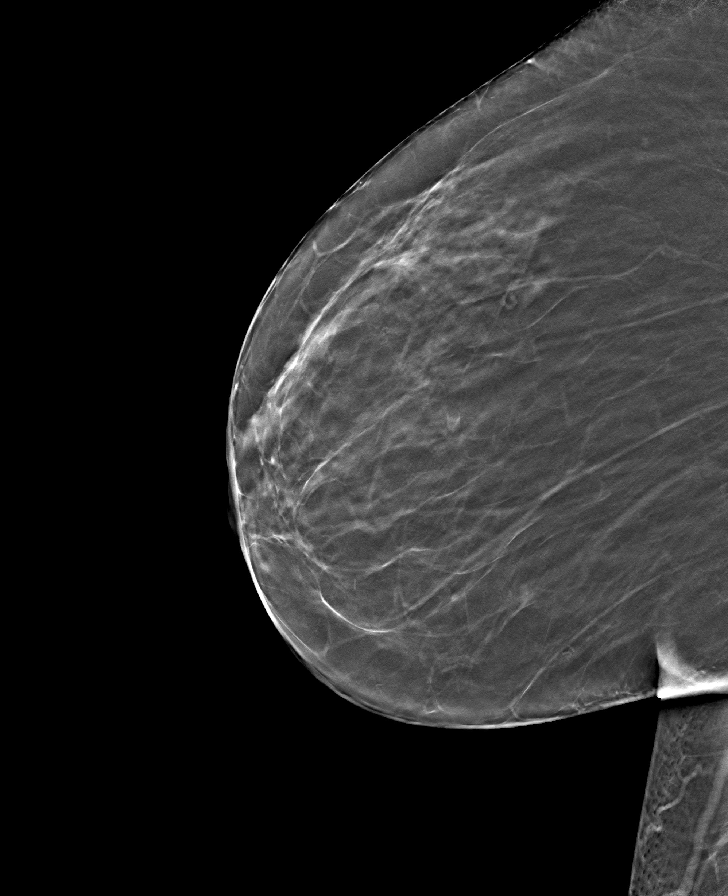

[8 of 40 positions shown; findings below may reference images not displayed]

ACR Breast Density Category b: There are scattered areas of
fibroglandular density.
FINDINGS: No suspicious mass, malignant type microcalcifications or distortion
detected in either breast.

Mammographic images were processed with CAD.

On physical exam, I do not palpate a mass in the subareolar region
of the left breast.

Targeted ultrasound is performed, showing normal tissue in the
subareolar region of the left breast. No ductal dilatation, mass or
abnormal shadowing detected.
IMPRESSION: No evidence of malignancy in either breast.

RECOMMENDATION:
Bilateral screening mammogram in 1 year is recommended.

I have discussed the findings and recommendations with the patient.
If applicable, a reminder letter will be sent to the patient
regarding the next appointment.

BI-RADS CATEGORY  1: Negative.

## 2020-03-10 DIAGNOSIS — I10 Essential (primary) hypertension: Secondary | ICD-10-CM | POA: Diagnosis not present

## 2020-03-10 DIAGNOSIS — R002 Palpitations: Secondary | ICD-10-CM | POA: Diagnosis not present

## 2020-03-10 DIAGNOSIS — Z23 Encounter for immunization: Secondary | ICD-10-CM | POA: Diagnosis not present

## 2020-03-31 DIAGNOSIS — R059 Cough, unspecified: Secondary | ICD-10-CM | POA: Diagnosis not present

## 2020-03-31 DIAGNOSIS — R5383 Other fatigue: Secondary | ICD-10-CM | POA: Diagnosis not present

## 2020-06-03 DIAGNOSIS — E785 Hyperlipidemia, unspecified: Secondary | ICD-10-CM | POA: Diagnosis not present

## 2020-06-03 DIAGNOSIS — I10 Essential (primary) hypertension: Secondary | ICD-10-CM | POA: Diagnosis not present

## 2020-06-03 DIAGNOSIS — J449 Chronic obstructive pulmonary disease, unspecified: Secondary | ICD-10-CM | POA: Diagnosis not present

## 2020-06-03 DIAGNOSIS — I25118 Atherosclerotic heart disease of native coronary artery with other forms of angina pectoris: Secondary | ICD-10-CM | POA: Diagnosis not present

## 2020-06-03 DIAGNOSIS — Z23 Encounter for immunization: Secondary | ICD-10-CM | POA: Diagnosis not present

## 2020-06-03 DIAGNOSIS — I2102 ST elevation (STEMI) myocardial infarction involving left anterior descending coronary artery: Secondary | ICD-10-CM | POA: Diagnosis not present

## 2020-06-03 DIAGNOSIS — R002 Palpitations: Secondary | ICD-10-CM | POA: Diagnosis not present

## 2020-06-03 DIAGNOSIS — Z955 Presence of coronary angioplasty implant and graft: Secondary | ICD-10-CM | POA: Diagnosis not present

## 2020-07-19 DIAGNOSIS — M25512 Pain in left shoulder: Secondary | ICD-10-CM | POA: Diagnosis not present

## 2020-07-19 DIAGNOSIS — I10 Essential (primary) hypertension: Secondary | ICD-10-CM | POA: Diagnosis not present

## 2020-07-19 DIAGNOSIS — F3342 Major depressive disorder, recurrent, in full remission: Secondary | ICD-10-CM | POA: Diagnosis not present

## 2020-07-19 DIAGNOSIS — F419 Anxiety disorder, unspecified: Secondary | ICD-10-CM | POA: Diagnosis not present

## 2020-07-19 DIAGNOSIS — Z0000 Encounter for general adult medical examination without abnormal findings: Secondary | ICD-10-CM | POA: Diagnosis not present

## 2020-07-19 DIAGNOSIS — E785 Hyperlipidemia, unspecified: Secondary | ICD-10-CM | POA: Diagnosis not present

## 2020-07-19 DIAGNOSIS — E119 Type 2 diabetes mellitus without complications: Secondary | ICD-10-CM | POA: Diagnosis not present

## 2020-07-22 ENCOUNTER — Other Ambulatory Visit: Payer: Self-pay | Admitting: Family Medicine

## 2020-07-22 DIAGNOSIS — Z1231 Encounter for screening mammogram for malignant neoplasm of breast: Secondary | ICD-10-CM

## 2020-07-27 ENCOUNTER — Encounter (INDEPENDENT_AMBULATORY_CARE_PROVIDER_SITE_OTHER): Payer: Self-pay | Admitting: Vascular Surgery

## 2020-07-27 ENCOUNTER — Other Ambulatory Visit: Payer: Self-pay

## 2020-07-27 ENCOUNTER — Ambulatory Visit (INDEPENDENT_AMBULATORY_CARE_PROVIDER_SITE_OTHER): Payer: Medicare HMO | Admitting: Vascular Surgery

## 2020-07-27 VITALS — BP 122/79 | HR 60 | Resp 16 | Wt 185.4 lb

## 2020-07-27 DIAGNOSIS — I83812 Varicose veins of left lower extremities with pain: Secondary | ICD-10-CM | POA: Diagnosis not present

## 2020-07-27 DIAGNOSIS — I83813 Varicose veins of bilateral lower extremities with pain: Secondary | ICD-10-CM

## 2020-07-27 NOTE — Progress Notes (Signed)
Courtney Blanchard is a 65 y.o.female who presents with painful varicose veins of the left leg  Past Medical History:  Diagnosis Date  . Anxiety   . Asthma   . Bronchitis   . Chronic cough   . Complication of anesthesia    nausea and vomiting  . COPD (chronic obstructive pulmonary disease) (HCC)   . Coronary artery disease   . Depression   . Diabetes mellitus without complication (HCC)   . Diverticulosis   . Dyspnea   . Dysrhythmia   . Environmental and seasonal allergies   . Hypertension   . Lower extremity edema   . Tremors of nervous system   . Wheezing     Past Surgical History:  Procedure Laterality Date  . ABDOMINAL HYSTERECTOMY    . BARIATRIC SURGERY    . CARDIAC CATHETERIZATION     with stent  . CHOLECYSTECTOMY    . COLON SURGERY    . LEFT HEART CATH AND CORONARY ANGIOGRAPHY N/A 01/28/2018   Procedure: LEFT HEART CATH AND CORONARY ANGIOGRAPHY;  Surgeon: Dalia Heading, MD;  Location: [facility];  Service: Cardiovascular;  Laterality: N/A;  . LEFT HEART CATH AND CORONARY ANGIOGRAPHY N/A 02/13/2019   Procedure: LEFT HEART CATH AND CORONARY ANGIOGRAPHY;  Surgeon: Marcina Millard, MD;  Location: [facility];  Service: Cardiovascular;  Laterality: N/A;    Current Outpatient Medications  Medication Sig Dispense Refill  . acetaminophen (TYLENOL) 500 MG tablet Take 500-1,000 mg by mouth 2 (two) times daily as needed for moderate pain or headache.     . ALPRAZolam (XANAX XR) 1 MG 24 hr tablet Take 1 mg by mouth daily.    Marland Kitchen aspirin 81 MG tablet Take 81 mg by mouth daily.     . baclofen (LIORESAL) 10 MG tablet     . clopidogrel (PLAVIX) 75 MG tablet Take 1 tablet (75 mg total) by mouth daily. 30 tablet 0  . cyclobenzaprine (FLEXERIL) 5 MG tablet     . DULoxetine (CYMBALTA) 20 MG capsule Take 20 mg by mouth daily.    Marland Kitchen gabapentin (NEURONTIN) 300 MG capsule Take 600 mg by mouth at bedtime.     Marland Kitchen guaiFENesin (MUCINEX) 600 MG 12 hr tablet Take 1  tablet (600 mg total) by mouth 2 (two) times daily. 30 tablet 0  . isosorbide mononitrate (IMDUR) 60 MG 24 hr tablet Take 1 tablet (60 mg total) by mouth daily. 30 tablet 0  . montelukast (SINGULAIR) 10 MG tablet Take 10 mg by mouth daily.     . nitroGLYCERIN (NITROSTAT) 0.4 MG SL tablet Place 1 tablet (0.4 mg total) under the tongue every 5 (five) minutes as needed for chest pain. 30 tablet 0  . predniSONE (DELTASONE) 5 MG tablet Taper 5,5,4,4,3,3,2,2,1,1,off    . rOPINIRole (REQUIP) 0.25 MG tablet     . simvastatin (ZOCOR) 40 MG tablet Take 1 tablet (40 mg total) by mouth daily at 6 PM. 30 tablet 0   No current facility-administered medications for this visit.    Allergies  Allergen Reactions  . Amoxicillin     Has patient had a PCN reaction causing immediate rash, facial/tongue/throat swelling, SOB or lightheadedness with hypotension: Yes Has patient had a PCN reaction causing severe rash involving mucus membranes or skin necrosis: No Has patient had a PCN reaction that required hospitalization: No Has patient had a PCN reaction occurring within the last 10 years: No If all of the above answers are "NO", then may  proceed with Cephalosporin use.  Sore throat   . Atorvastatin     Tremors     Indication: Patient presents with symptomatic varicose veins of the left lower extremity.  Procedure: Foam sclerotherapy was performed on the left lower extremity. Using ultrasound guidance, 5 mL of foam Sotradecol was used to inject the varicosities of the left lower extremity. Compression wraps were placed. The patient tolerated the procedure well.

## 2020-07-29 ENCOUNTER — Telehealth (INDEPENDENT_AMBULATORY_CARE_PROVIDER_SITE_OTHER): Payer: Self-pay

## 2020-07-29 NOTE — Telephone Encounter (Signed)
Patient called back and was given the recommendations from Sheppard Plumber NP. Patient can not take the Ibuprofen due to being on Aspirin therapy and prednisone. Patient was advised to wear compression hose and elevated as much as possible and also to walk which will help with blood flow as well.

## 2020-07-29 NOTE — Telephone Encounter (Signed)
Sounds like there was some cramping due to irritation.  Patient should take some ibuprofen.  She should also apply some ice to the area

## 2020-08-17 ENCOUNTER — Ambulatory Visit (INDEPENDENT_AMBULATORY_CARE_PROVIDER_SITE_OTHER): Payer: Medicare HMO | Admitting: Vascular Surgery

## 2020-08-18 ENCOUNTER — Encounter (INDEPENDENT_AMBULATORY_CARE_PROVIDER_SITE_OTHER): Payer: Self-pay | Admitting: Vascular Surgery

## 2020-08-24 ENCOUNTER — Other Ambulatory Visit: Payer: Self-pay

## 2020-08-24 ENCOUNTER — Encounter (INDEPENDENT_AMBULATORY_CARE_PROVIDER_SITE_OTHER): Payer: Self-pay | Admitting: Vascular Surgery

## 2020-08-24 ENCOUNTER — Ambulatory Visit (INDEPENDENT_AMBULATORY_CARE_PROVIDER_SITE_OTHER): Payer: Medicare HMO | Admitting: Vascular Surgery

## 2020-08-24 VITALS — BP 112/73 | HR 59 | Ht 62.0 in | Wt 185.0 lb

## 2020-08-24 DIAGNOSIS — I83812 Varicose veins of left lower extremities with pain: Secondary | ICD-10-CM | POA: Diagnosis not present

## 2020-08-24 DIAGNOSIS — I83813 Varicose veins of bilateral lower extremities with pain: Secondary | ICD-10-CM

## 2020-08-24 NOTE — Progress Notes (Signed)
Courtney Blanchard is a 65 y.o.female who presents with painful varicose veins of the left leg  Past Medical History:  Diagnosis Date   Anxiety    Asthma    Bronchitis    Chronic cough    Complication of anesthesia    nausea and vomiting   COPD (chronic obstructive pulmonary disease) (HCC)    Coronary artery disease    Depression    Diabetes mellitus without complication (HCC)    Diverticulosis    Dyspnea    Dysrhythmia    Environmental and seasonal allergies    Hypertension    Lower extremity edema    Tremors of nervous system    Wheezing     Past Surgical History:  Procedure Laterality Date   ABDOMINAL HYSTERECTOMY     BARIATRIC SURGERY     CARDIAC CATHETERIZATION     with stent   CHOLECYSTECTOMY     COLON SURGERY     LEFT HEART CATH AND CORONARY ANGIOGRAPHY N/A 01/28/2018   Procedure: LEFT HEART CATH AND CORONARY ANGIOGRAPHY;  Surgeon: Dalia Heading, MD;  Location: [facility];  Service: Cardiovascular;  Laterality: N/A;   LEFT HEART CATH AND CORONARY ANGIOGRAPHY N/A 02/13/2019   Procedure: LEFT HEART CATH AND CORONARY ANGIOGRAPHY;  Surgeon: Marcina Millard, MD;  Location: [facility];  Service: Cardiovascular;  Laterality: N/A;    Current Outpatient Medications  Medication Sig Dispense Refill   acetaminophen (TYLENOL) 500 MG tablet Take 500-1,000 mg by mouth 2 (two) times daily as needed for moderate pain or headache.      ALPRAZolam (XANAX XR) 1 MG 24 hr tablet Take 1 mg by mouth daily.     aspirin 81 MG tablet Take 81 mg by mouth daily.      baclofen (LIORESAL) 10 MG tablet      baclofen (LIORESAL) 10 MG tablet Take by mouth.     clopidogrel (PLAVIX) 75 MG tablet Take 1 tablet (75 mg total) by mouth daily. 30 tablet 0   cyclobenzaprine (FLEXERIL) 5 MG tablet      DULoxetine (CYMBALTA) 20 MG capsule Take 20 mg by mouth daily.     gabapentin (NEURONTIN) 300 MG capsule Take 600 mg by mouth at bedtime.      gabapentin (NEURONTIN) 300 MG  capsule Take 1 capsule by mouth 3 (three) times daily.     guaiFENesin (MUCINEX) 600 MG 12 hr tablet Take 1 tablet (600 mg total) by mouth 2 (two) times daily. 30 tablet 0   isosorbide mononitrate (IMDUR) 60 MG 24 hr tablet Take 1 tablet (60 mg total) by mouth daily. 30 tablet 0   montelukast (SINGULAIR) 10 MG tablet Take 10 mg by mouth daily.      nitroGLYCERIN (NITROSTAT) 0.4 MG SL tablet Place 1 tablet (0.4 mg total) under the tongue every 5 (five) minutes as needed for chest pain. 30 tablet 0   predniSONE (DELTASONE) 5 MG tablet Taper 5,5,4,4,3,3,2,2,1,1,off     rOPINIRole (REQUIP) 0.25 MG tablet      simvastatin (ZOCOR) 40 MG tablet Take 1 tablet (40 mg total) by mouth daily at 6 PM. 30 tablet 0   No current facility-administered medications for this visit.    Allergies  Allergen Reactions   Amoxicillin     Has patient had a PCN reaction causing immediate rash, facial/tongue/throat swelling, SOB or lightheadedness with hypotension: Yes Has patient had a PCN reaction causing severe rash involving mucus membranes or skin necrosis: No Has patient had  a PCN reaction that required hospitalization: No Has patient had a PCN reaction occurring within the last 10 years: No If all of the above answers are "NO", then may proceed with Cephalosporin use.  Sore throat    Atorvastatin     Tremors     Indication: Patient presents with symptomatic varicose veins of the left lower extremity.  Procedure: Foam sclerotherapy was performed on the left lower extremity. Using ultrasound guidance, 5 mL of foam Sotradecol was used to inject the varicosities of the left lower extremity. Compression wraps were placed. The patient tolerated the procedure well.

## 2020-08-31 ENCOUNTER — Emergency Department (HOSPITAL_COMMUNITY): Payer: Medicare HMO

## 2020-08-31 ENCOUNTER — Other Ambulatory Visit: Payer: Self-pay

## 2020-08-31 ENCOUNTER — Emergency Department (HOSPITAL_COMMUNITY)
Admission: EM | Admit: 2020-08-31 | Discharge: 2020-09-01 | Disposition: A | Discharge status: Home / Self Care | Payer: Medicare HMO | Attending: Emergency Medicine | Admitting: Emergency Medicine

## 2020-08-31 DIAGNOSIS — J449 Chronic obstructive pulmonary disease, unspecified: Secondary | ICD-10-CM | POA: Insufficient documentation

## 2020-08-31 DIAGNOSIS — J45909 Unspecified asthma, uncomplicated: Secondary | ICD-10-CM | POA: Insufficient documentation

## 2020-08-31 DIAGNOSIS — R079 Chest pain, unspecified: Secondary | ICD-10-CM

## 2020-08-31 DIAGNOSIS — Z7982 Long term (current) use of aspirin: Secondary | ICD-10-CM | POA: Diagnosis not present

## 2020-08-31 DIAGNOSIS — I2510 Atherosclerotic heart disease of native coronary artery without angina pectoris: Secondary | ICD-10-CM | POA: Diagnosis not present

## 2020-08-31 DIAGNOSIS — Z955 Presence of coronary angioplasty implant and graft: Secondary | ICD-10-CM | POA: Insufficient documentation

## 2020-08-31 DIAGNOSIS — E119 Type 2 diabetes mellitus without complications: Secondary | ICD-10-CM | POA: Diagnosis not present

## 2020-08-31 DIAGNOSIS — R0789 Other chest pain: Secondary | ICD-10-CM | POA: Diagnosis not present

## 2020-08-31 DIAGNOSIS — R0602 Shortness of breath: Secondary | ICD-10-CM | POA: Diagnosis not present

## 2020-08-31 DIAGNOSIS — R61 Generalized hyperhidrosis: Secondary | ICD-10-CM | POA: Insufficient documentation

## 2020-08-31 DIAGNOSIS — R110 Nausea: Secondary | ICD-10-CM | POA: Insufficient documentation

## 2020-08-31 DIAGNOSIS — I10 Essential (primary) hypertension: Secondary | ICD-10-CM | POA: Diagnosis not present

## 2020-08-31 LAB — BASIC METABOLIC PANEL
Anion gap: 4 — ABNORMAL LOW (ref 5–15)
BUN: 14 mg/dL (ref 8–23)
CO2: 27 mmol/L (ref 22–32)
Calcium: 8.6 mg/dL — ABNORMAL LOW (ref 8.9–10.3)
Chloride: 107 mmol/L (ref 98–111)
Creatinine, Ser: 0.72 mg/dL (ref 0.44–1.00)
GFR, Estimated: >60 mL/min (ref >60)
Glucose, Bld: 84 mg/dL (ref 70–99)
Potassium: 4.2 mmol/L (ref 3.5–5.1)
Sodium: 138 mmol/L (ref 135–145)

## 2020-08-31 LAB — CBC
HCT: 38.2 % (ref 36.0–46.0)
Hemoglobin: 11.8 g/dL — ABNORMAL LOW (ref 12.0–15.0)
MCH: 27.5 pg (ref 26.0–34.0)
MCHC: 30.9 g/dL (ref 30.0–36.0)
MCV: 89 fL (ref 80.0–100.0)
Platelets: 283 10*3/uL (ref 150–400)
RBC: 4.29 MIL/uL (ref 3.87–5.11)
RDW: 13.5 % (ref 11.5–15.5)
WBC: 4.9 10*3/uL (ref 4.0–10.5)
nRBC: 0 % (ref 0.0–0.2)

## 2020-08-31 LAB — TROPONIN I (HIGH SENSITIVITY)
Troponin I (High Sensitivity): 5 ng/L (ref <18)
Troponin I (High Sensitivity): 5 ng/L (ref <18)

## 2020-08-31 NOTE — ED Notes (Signed)
Patient in restroom when called for VS

## 2020-08-31 NOTE — ED Triage Notes (Signed)
Pt reports 1600 sudden onset of diaphoresis and nausea. Pt went to lay down and chest pain started. Took two nitro with complete resolution of pain. Hx of NSTEMI and aortic dissection. Pt currently pain free.

## 2020-09-01 NOTE — ED Provider Notes (Signed)
[facility]   CSN: [id number] Arrival date & time: 08/31/20  1744     History Chief Complaint  Patient presents with   Chest Pain    Courtney Blanchard is a 65 y.o. female.  Patient presents to the emergency department for evaluation of chest pain.  Patient reports that just before 4 PM she developed nausea and felt slightly sweaty.  She went to lie down and then developed chest pain.  Patient reports that she had an anterior pain that radiated to the left and right side of the chest.  There was mild shortness of breath.  Patient took 2 nitroglycerin tablets and pain resolved.  Pain was present for approximately 15 minutes total.  Patient has not had any pain now for 8 hours.      Past Medical History:  Diagnosis Date   Anxiety    Asthma    Bronchitis    Chronic cough    Complication of anesthesia    nausea and vomiting   COPD (chronic obstructive pulmonary disease) (HCC)    Coronary artery disease    Depression    Diabetes mellitus without complication (HCC)    Diverticulosis    Dyspnea    Dysrhythmia    Environmental and seasonal allergies    Hypertension    Lower extremity edema    Tremors of nervous system    Wheezing     Patient Active Problem List   Diagnosis Date Noted   Allergy 09/12/2019   Asthma without status asthmaticus 09/12/2019   Borderline blood pressure 09/12/2019   Diabetes mellitus (HCC) 09/12/2019   Endometriosis 09/12/2019   Migraines 09/12/2019   S/P coronary artery stent placement 09/12/2019   Varicose veins of leg with pain, bilateral 09/12/2019   Varicose veins of leg with pain, right 08/12/2019   Elevated troponin    Spontaneous dissection of coronary artery    Fatigue    Neuropathy    Acute ST elevation myocardial infarction (STEMI) involving left anterior descending (LAD) coronary artery (HCC) 02/13/2019   Hypertension    COPD (chronic obstructive pulmonary disease) (HCC)     Coronary artery disease    Depression    Anxiety    HLD (hyperlipidemia)    Heart palpitations 05/13/2018   NSTEMI (non-ST elevated myocardial infarction) (HCC) 01/27/2018   Chest pain 08/31/2016    Past Surgical History:  Procedure Laterality Date   ABDOMINAL HYSTERECTOMY     BARIATRIC SURGERY     CARDIAC CATHETERIZATION     with stent   CHOLECYSTECTOMY     COLON SURGERY     LEFT HEART CATH AND CORONARY ANGIOGRAPHY N/A 01/28/2018   Procedure: LEFT HEART CATH AND CORONARY ANGIOGRAPHY;  Surgeon: Dalia HeadingFath, Kenneth A, MD;  Location: [facility];  Service: Cardiovascular;  Laterality: N/A;   LEFT HEART CATH AND CORONARY ANGIOGRAPHY N/A 02/13/2019   Procedure: LEFT HEART CATH AND CORONARY ANGIOGRAPHY;  Surgeon: Marcina MillardParaschos, Alexander, MD;  Location: [facility];  Service: Cardiovascular;  Laterality: N/A;     OB History   No obstetric history on file.     Family History  Problem Relation Age of Onset   Hypertension Mother    Heart disease Father    CAD Father    Heart disease Sister    Stroke Sister    Breast cancer Neg Hx     Social History   Tobacco Use   Smoking status: Never   Smokeless tobacco: Never  Vaping Use   Vaping Use: Never used  Substance Use Topics   Alcohol use: No   Drug use: No    Home Medications Prior to Admission medications   Medication Sig Start Date End Date Taking? Authorizing Provider  acetaminophen (TYLENOL) 500 MG tablet Take 500-1,000 mg by mouth 2 (two) times daily as needed for moderate pain or headache.     [provider]  ALPRAZolam (XANAX XR) 1 MG 24 hr tablet Take 1 mg by mouth daily. 02/11/19   [provider]  aspirin 81 MG tablet Take 81 mg by mouth daily.     [provider]  baclofen (LIORESAL) 10 MG tablet  07/04/19   [provider]  baclofen (LIORESAL) 10 MG tablet Take by mouth. 08/23/20 09/22/20  [provider]  clopidogrel (PLAVIX) 75 MG tablet Take 1 tablet (75 mg  total) by mouth daily. 02/17/19   Rolly Salter, MD  cyclobenzaprine (FLEXERIL) 5 MG tablet  06/27/19   [provider]  DULoxetine (CYMBALTA) 20 MG capsule Take 20 mg by mouth daily. 02/04/19   [provider]  gabapentin (NEURONTIN) 300 MG capsule Take 600 mg by mouth at bedtime.     [provider]  gabapentin (NEURONTIN) 300 MG capsule Take 1 capsule by mouth 3 (three) times daily. 08/03/20 08/03/21  [provider]  guaiFENesin (MUCINEX) 600 MG 12 hr tablet Take 1 tablet (600 mg total) by mouth 2 (two) times daily. 02/17/19   Rolly Salter, MD  isosorbide mononitrate (IMDUR) 60 MG 24 hr tablet Take 1 tablet (60 mg total) by mouth daily. 02/21/19   Alford Highland, MD  montelukast (SINGULAIR) 10 MG tablet Take 10 mg by mouth daily.     [provider]  nitroGLYCERIN (NITROSTAT) 0.4 MG SL tablet Place 1 tablet (0.4 mg total) under the tongue every 5 (five) minutes as needed for chest pain. 02/17/19   Rolly Salter, MD  predniSONE (DELTASONE) 5 MG tablet Taper 5,5,4,4,3,3,2,2,1,1,off 07/19/20   [provider]  rOPINIRole (REQUIP) 0.25 MG tablet  05/20/19   [provider]  simvastatin (ZOCOR) 40 MG tablet Take 1 tablet (40 mg total) by mouth daily at 6 PM. 02/17/19   Rolly Salter, MD    Allergies    Amoxicillin and Atorvastatin  Review of Systems   Review of Systems  Respiratory:  Positive for shortness of breath.   Cardiovascular:  Positive for chest pain.  Gastrointestinal:  Positive for nausea.  All other systems reviewed and are negative.  Physical Exam Updated Vital Signs BP 138/84   Pulse (!) 55   Temp 97.6 F (36.4 C) (Oral)   Resp 16   SpO2 100%   Physical Exam Vitals and nursing note reviewed.  Constitutional:      General: She is not in acute distress.    Appearance: Normal appearance. She is well-developed.  HENT:     Head: Normocephalic and atraumatic.     Right Ear: Hearing normal.     Left Ear:  Hearing normal.     Nose: Nose normal.  Eyes:     Conjunctiva/sclera: Conjunctivae normal.     Pupils: Pupils are equal, round, and reactive to light.  Cardiovascular:     Rate and Rhythm: Regular rhythm.     Heart sounds: S1 normal and S2 normal. No murmur heard.   No friction rub. No gallop.  Pulmonary:     Effort: Pulmonary effort is normal. No respiratory distress.  Breath sounds: Normal breath sounds.  Chest:     Chest wall: No tenderness.  Abdominal:     General: Bowel sounds are normal.     Palpations: Abdomen is soft.     Tenderness: There is no abdominal tenderness. There is no guarding or rebound. Negative signs include Murphy's sign and McBurney's sign.     Hernia: No hernia is present.  Musculoskeletal:        General: Normal range of motion.     Cervical back: Normal range of motion and neck supple.  Skin:    General: Skin is warm and dry.     Findings: No rash.  Neurological:     Mental Status: She is alert and oriented to person, place, and time.     GCS: GCS eye subscore is 4. GCS verbal subscore is 5. GCS motor subscore is 6.     Cranial Nerves: No cranial nerve deficit.     Sensory: No sensory deficit.     Coordination: Coordination normal.  Psychiatric:        Speech: Speech normal.        Behavior: Behavior normal.        Thought Content: Thought content normal.    ED Results / Procedures / Treatments   Labs (all labs ordered are listed, but only abnormal results are displayed) Labs Reviewed  BASIC METABOLIC PANEL - Abnormal; Notable for the following components:      Result Value   Calcium 8.6 (*)    Anion gap 4 (*)    All other components within normal limits  CBC - Abnormal; Notable for the following components:   Hemoglobin 11.8 (*)    All other components within normal limits  TROPONIN I (HIGH SENSITIVITY)  TROPONIN I (HIGH SENSITIVITY)    EKG EKG Interpretation  Date/Time:  Tuesday August 31 2020 18:05:29 EDT Ventricular Rate:   62 PR Interval:  132 QRS Duration: 72 QT Interval:  396 QTC Calculation: 401 R Axis:   57 Text Interpretation: Normal sinus rhythm Normal ECG Confirmed by Gilda Crease [id number] on 09/01/2020 12:12:56 AM  Radiology DG Chest 2 View  Result Date: 08/31/2020 CLINICAL DATA:  Chest pain EXAM: CHEST - 2 VIEW COMPARISON:  02/19/2019 FINDINGS: The heart size and mediastinal contours are within normal limits. Both lungs are clear. The visualized skeletal structures are unremarkable. IMPRESSION: No active cardiopulmonary disease. Electronically Signed   By: Jasmine Pang M.D.   On: 08/31/2020 18:21    Procedures Procedures   Medications Ordered in ED Medications - No data to display  ED Course  I have reviewed the triage vital signs and the nursing notes.  Pertinent labs & imaging results that were available during my care of the patient were reviewed by me and considered in my medical decision making (see chart for details).    MDM Rules/Calculators/A&P                          Patient appears well at this time.  She is at her baseline, no chest pain, nausea, diaphoresis or shortness of breath at this time.  She did have an episode of this more than 8 hours ago.  It resolved after approximately 15 minutes and 2 nitroglycerin tablets.  Patient does have a known history of CAD.  This includes multiple stents and a spontaneous dissection of the LAD just over a year ago.  No intervention was performed at that time.  Patient is EKG is normal.  She has had 2 high-sensitivity troponins that are normal.  Patient denies any recent chest pain leading up to this, no crescendo type angina symptoms.  He does, however, feel that the symptoms felt somewhat similar to her heart attack but simply did not last.  Discussed with Dr. Noemi Chapel, on-call for cardiology.  Specifically discussed the possibility of requiring hospitalization versus outpatient follow-up.  Dr. Noemi Chapel did not feel that the  patient requires hospitalization at this time.  Work-up is reassuring.  Recommendation is prompt outpatient follow-up with her cardiologist.  Patient was counseled that she needs to return to the ER for any recurrence of her symptoms.  She understands this and will call her doctor in the morning.  Final Clinical Impression(s) / ED Diagnoses Final diagnoses:  Chest pain, unspecified type    Rx / DC Orders ED Discharge Orders     None        Lanore Renderos, Canary Brim, MD 09/01/20 [phone]

## 2020-09-02 DIAGNOSIS — Z955 Presence of coronary angioplasty implant and graft: Secondary | ICD-10-CM | POA: Diagnosis not present

## 2020-09-02 DIAGNOSIS — I83813 Varicose veins of bilateral lower extremities with pain: Secondary | ICD-10-CM | POA: Diagnosis not present

## 2020-09-02 DIAGNOSIS — E785 Hyperlipidemia, unspecified: Secondary | ICD-10-CM | POA: Diagnosis not present

## 2020-09-02 DIAGNOSIS — I2102 ST elevation (STEMI) myocardial infarction involving left anterior descending coronary artery: Secondary | ICD-10-CM | POA: Diagnosis not present

## 2020-09-02 DIAGNOSIS — I10 Essential (primary) hypertension: Secondary | ICD-10-CM | POA: Diagnosis not present

## 2020-09-02 DIAGNOSIS — E1169 Type 2 diabetes mellitus with other specified complication: Secondary | ICD-10-CM | POA: Diagnosis not present

## 2020-09-02 DIAGNOSIS — R002 Palpitations: Secondary | ICD-10-CM | POA: Diagnosis not present

## 2020-09-02 DIAGNOSIS — I83811 Varicose veins of right lower extremities with pain: Secondary | ICD-10-CM | POA: Diagnosis not present

## 2020-09-02 DIAGNOSIS — I25118 Atherosclerotic heart disease of native coronary artery with other forms of angina pectoris: Secondary | ICD-10-CM | POA: Diagnosis not present

## 2020-09-10 ENCOUNTER — Ambulatory Visit (INDEPENDENT_AMBULATORY_CARE_PROVIDER_SITE_OTHER): Payer: Medicare HMO | Admitting: Vascular Surgery

## 2020-09-10 ENCOUNTER — Other Ambulatory Visit: Payer: Self-pay

## 2020-09-10 VITALS — BP 103/64 | HR 59 | Ht 62.0 in | Wt 182.0 lb

## 2020-09-10 DIAGNOSIS — I83813 Varicose veins of bilateral lower extremities with pain: Secondary | ICD-10-CM

## 2020-09-10 DIAGNOSIS — I83812 Varicose veins of left lower extremities with pain: Secondary | ICD-10-CM | POA: Diagnosis not present

## 2020-09-10 NOTE — Progress Notes (Signed)
Courtney Blanchard is a 65 y.o.female who presents with painful varicose veins of the left leg  Past Medical History:  Diagnosis Date   Anxiety    Asthma    Bronchitis    Chronic cough    Complication of anesthesia    nausea and vomiting   COPD (chronic obstructive pulmonary disease) (HCC)    Coronary artery disease    Depression    Diabetes mellitus without complication (HCC)    Diverticulosis    Dyspnea    Dysrhythmia    Environmental and seasonal allergies    Hypertension    Lower extremity edema    Tremors of nervous system    Wheezing     Past Surgical History:  Procedure Laterality Date   ABDOMINAL HYSTERECTOMY     BARIATRIC SURGERY     CARDIAC CATHETERIZATION     with stent   CHOLECYSTECTOMY     COLON SURGERY     LEFT HEART CATH AND CORONARY ANGIOGRAPHY N/A 01/28/2018   Procedure: LEFT HEART CATH AND CORONARY ANGIOGRAPHY;  Surgeon: Dalia Heading, MD;  Location: [facility];  Service: Cardiovascular;  Laterality: N/A;   LEFT HEART CATH AND CORONARY ANGIOGRAPHY N/A 02/13/2019   Procedure: LEFT HEART CATH AND CORONARY ANGIOGRAPHY;  Surgeon: Marcina Millard, MD;  Location: [facility];  Service: Cardiovascular;  Laterality: N/A;    Current Outpatient Medications  Medication Sig Dispense Refill   acetaminophen (TYLENOL) 500 MG tablet Take 500-1,000 mg by mouth 2 (two) times daily as needed for moderate pain or headache.      ALPRAZolam (XANAX XR) 1 MG 24 hr tablet Take 1 mg by mouth daily.     aspirin 81 MG tablet Take 81 mg by mouth daily.      baclofen (LIORESAL) 10 MG tablet      baclofen (LIORESAL) 10 MG tablet Take by mouth.     clopidogrel (PLAVIX) 75 MG tablet Take 1 tablet (75 mg total) by mouth daily. 30 tablet 0   cyclobenzaprine (FLEXERIL) 5 MG tablet      DULoxetine (CYMBALTA) 20 MG capsule Take 20 mg by mouth daily.     DULoxetine (CYMBALTA) 20 MG capsule Take by mouth.     gabapentin (NEURONTIN) 300 MG capsule Take 600 mg by  mouth at bedtime.      gabapentin (NEURONTIN) 300 MG capsule Take 1 capsule by mouth 3 (three) times daily.     guaiFENesin (MUCINEX) 600 MG 12 hr tablet Take 1 tablet (600 mg total) by mouth 2 (two) times daily. 30 tablet 0   isosorbide mononitrate (IMDUR) 60 MG 24 hr tablet Take 1 tablet (60 mg total) by mouth daily. 30 tablet 0   metoprolol succinate (TOPROL-XL) 25 MG 24 hr tablet Take 25 mg by mouth daily.     montelukast (SINGULAIR) 10 MG tablet Take 10 mg by mouth daily.      nitroGLYCERIN (NITROSTAT) 0.4 MG SL tablet Place 1 tablet (0.4 mg total) under the tongue every 5 (five) minutes as needed for chest pain. 30 tablet 0   predniSONE (DELTASONE) 5 MG tablet Taper 5,5,4,4,3,3,2,2,1,1,off     rOPINIRole (REQUIP) 0.25 MG tablet      simvastatin (ZOCOR) 40 MG tablet Take 1 tablet (40 mg total) by mouth daily at 6 PM. 30 tablet 0   traZODone (DESYREL) 50 MG tablet Take 50 mg by mouth at bedtime.     No current facility-administered medications for this visit.    Allergies  Allergen Reactions   Amoxicillin     Has patient had a PCN reaction causing immediate rash, facial/tongue/throat swelling, SOB or lightheadedness with hypotension: Yes Has patient had a PCN reaction causing severe rash involving mucus membranes or skin necrosis: No Has patient had a PCN reaction that required hospitalization: No Has patient had a PCN reaction occurring within the last 10 years: No If all of the above answers are "NO", then may proceed with Cephalosporin use.  Sore throat    Atorvastatin     Tremors     Indication: Patient presents with symptomatic varicose veins of the left lower extremity.  Procedure: Foam sclerotherapy was performed on the left lower extremity. Using ultrasound guidance, 5 mL of foam Sotradecol was used to inject the varicosities of the left lower extremity. Compression wraps were placed. The patient tolerated the procedure well.

## 2020-09-14 ENCOUNTER — Ambulatory Visit (INDEPENDENT_AMBULATORY_CARE_PROVIDER_SITE_OTHER): Payer: Medicare HMO | Admitting: Vascular Surgery

## 2020-09-14 DIAGNOSIS — U071 COVID-19: Secondary | ICD-10-CM | POA: Diagnosis not present

## 2020-09-14 DIAGNOSIS — Z03818 Encounter for observation for suspected exposure to other biological agents ruled out: Secondary | ICD-10-CM | POA: Diagnosis not present

## 2020-09-14 DIAGNOSIS — J029 Acute pharyngitis, unspecified: Secondary | ICD-10-CM | POA: Diagnosis not present

## 2020-09-14 DIAGNOSIS — I25118 Atherosclerotic heart disease of native coronary artery with other forms of angina pectoris: Secondary | ICD-10-CM | POA: Diagnosis not present

## 2020-09-21 ENCOUNTER — Ambulatory Visit (INDEPENDENT_AMBULATORY_CARE_PROVIDER_SITE_OTHER): Payer: Medicare HMO | Admitting: Vascular Surgery

## 2020-10-12 ENCOUNTER — Ambulatory Visit (INDEPENDENT_AMBULATORY_CARE_PROVIDER_SITE_OTHER): Payer: Medicare HMO | Admitting: Vascular Surgery

## 2020-10-19 ENCOUNTER — Encounter (INDEPENDENT_AMBULATORY_CARE_PROVIDER_SITE_OTHER): Payer: Self-pay

## 2020-10-19 ENCOUNTER — Ambulatory Visit (INDEPENDENT_AMBULATORY_CARE_PROVIDER_SITE_OTHER): Payer: Medicare HMO | Admitting: Vascular Surgery

## 2020-10-26 ENCOUNTER — Ambulatory Visit (INDEPENDENT_AMBULATORY_CARE_PROVIDER_SITE_OTHER): Payer: Medicare HMO | Admitting: Vascular Surgery

## 2020-10-26 ENCOUNTER — Encounter (INDEPENDENT_AMBULATORY_CARE_PROVIDER_SITE_OTHER): Payer: Self-pay | Admitting: Vascular Surgery

## 2020-10-26 ENCOUNTER — Other Ambulatory Visit: Payer: Self-pay

## 2020-10-26 VITALS — BP 131/85 | HR 55 | Resp 16 | Wt 186.0 lb

## 2020-10-26 DIAGNOSIS — I83813 Varicose veins of bilateral lower extremities with pain: Secondary | ICD-10-CM | POA: Diagnosis not present

## 2020-10-26 DIAGNOSIS — I83812 Varicose veins of left lower extremities with pain: Secondary | ICD-10-CM

## 2020-10-26 NOTE — Progress Notes (Signed)
Courtney Blanchard is a 65 y.o.female who presents with painful varicose veins of the left leg  Past Medical History:  Diagnosis Date   Anxiety    Asthma    Bronchitis    Chronic cough    Complication of anesthesia    nausea and vomiting   COPD (chronic obstructive pulmonary disease) (HCC)    Coronary artery disease    Depression    Diabetes mellitus without complication (HCC)    Diverticulosis    Dyspnea    Dysrhythmia    Environmental and seasonal allergies    Hypertension    Lower extremity edema    Tremors of nervous system    Wheezing     Past Surgical History:  Procedure Laterality Date   ABDOMINAL HYSTERECTOMY     BARIATRIC SURGERY     CARDIAC CATHETERIZATION     with stent   CHOLECYSTECTOMY     COLON SURGERY     LEFT HEART CATH AND CORONARY ANGIOGRAPHY N/A 01/28/2018   Procedure: LEFT HEART CATH AND CORONARY ANGIOGRAPHY;  Surgeon: Dalia Heading, MD;  Location: [facility];  Service: Cardiovascular;  Laterality: N/A;   LEFT HEART CATH AND CORONARY ANGIOGRAPHY N/A 02/13/2019   Procedure: LEFT HEART CATH AND CORONARY ANGIOGRAPHY;  Surgeon: Marcina Millard, MD;  Location: [facility];  Service: Cardiovascular;  Laterality: N/A;    Current Outpatient Medications  Medication Sig Dispense Refill   acetaminophen (TYLENOL) 500 MG tablet Take 500-1,000 mg by mouth 2 (two) times daily as needed for moderate pain or headache.      ALPRAZolam (XANAX XR) 1 MG 24 hr tablet Take 1 mg by mouth daily.     aspirin 81 MG tablet Take 81 mg by mouth daily.      baclofen (LIORESAL) 10 MG tablet      clopidogrel (PLAVIX) 75 MG tablet Take 1 tablet (75 mg total) by mouth daily. 30 tablet 0   cyclobenzaprine (FLEXERIL) 5 MG tablet      DULoxetine (CYMBALTA) 20 MG capsule Take 20 mg by mouth daily.     DULoxetine (CYMBALTA) 20 MG capsule Take by mouth.     gabapentin (NEURONTIN) 300 MG capsule Take 600 mg by mouth at bedtime.      gabapentin (NEURONTIN) 300 MG  capsule Take 1 capsule by mouth 3 (three) times daily.     guaiFENesin (MUCINEX) 600 MG 12 hr tablet Take 1 tablet (600 mg total) by mouth 2 (two) times daily. 30 tablet 0   isosorbide mononitrate (IMDUR) 60 MG 24 hr tablet Take 1 tablet (60 mg total) by mouth daily. 30 tablet 0   metoprolol succinate (TOPROL-XL) 25 MG 24 hr tablet Take 25 mg by mouth daily.     montelukast (SINGULAIR) 10 MG tablet Take 10 mg by mouth daily.      nitroGLYCERIN (NITROSTAT) 0.4 MG SL tablet Place 1 tablet (0.4 mg total) under the tongue every 5 (five) minutes as needed for chest pain. 30 tablet 0   predniSONE (DELTASONE) 5 MG tablet Taper 5,5,4,4,3,3,2,2,1,1,off     rOPINIRole (REQUIP) 0.25 MG tablet      simvastatin (ZOCOR) 40 MG tablet Take 1 tablet (40 mg total) by mouth daily at 6 PM. 30 tablet 0   traZODone (DESYREL) 50 MG tablet Take 50 mg by mouth at bedtime.     No current facility-administered medications for this visit.    Allergies  Allergen Reactions   Amoxicillin     Has patient  had a PCN reaction causing immediate rash, facial/tongue/throat swelling, SOB or lightheadedness with hypotension: Yes Has patient had a PCN reaction causing severe rash involving mucus membranes or skin necrosis: No Has patient had a PCN reaction that required hospitalization: No Has patient had a PCN reaction occurring within the last 10 years: No If all of the above answers are "NO", then may proceed with Cephalosporin use.  Sore throat    Atorvastatin     Tremors     Indication: Patient presents with symptomatic varicose veins of the left lower extremity.  Procedure: Foam sclerotherapy was performed on the left lower extremity. Using ultrasound guidance, 5 mL of foam Sotradecol was used to inject the varicosities of the left lower extremity. Compression wraps were placed. The patient tolerated the procedure well.  

## 2020-11-23 ENCOUNTER — Encounter (INDEPENDENT_AMBULATORY_CARE_PROVIDER_SITE_OTHER): Payer: Self-pay | Admitting: Vascular Surgery

## 2020-11-23 ENCOUNTER — Ambulatory Visit (INDEPENDENT_AMBULATORY_CARE_PROVIDER_SITE_OTHER): Payer: Medicare HMO | Admitting: Vascular Surgery

## 2020-11-23 ENCOUNTER — Other Ambulatory Visit: Payer: Self-pay

## 2020-11-23 VITALS — BP 134/78 | HR 62 | Resp 16 | Wt 174.0 lb

## 2020-11-23 DIAGNOSIS — I83812 Varicose veins of left lower extremities with pain: Secondary | ICD-10-CM | POA: Diagnosis not present

## 2020-11-23 DIAGNOSIS — I83813 Varicose veins of bilateral lower extremities with pain: Secondary | ICD-10-CM

## 2020-11-23 NOTE — Progress Notes (Signed)
Courtney Blanchard is a 65 y.o.female who presents with painful varicose veins of the left leg  Past Medical History:  Diagnosis Date   Anxiety    Asthma    Bronchitis    Chronic cough    Complication of anesthesia    nausea and vomiting   COPD (chronic obstructive pulmonary disease) (HCC)    Coronary artery disease    Depression    Diabetes mellitus without complication (HCC)    Diverticulosis    Dyspnea    Dysrhythmia    Environmental and seasonal allergies    Hypertension    Lower extremity edema    Tremors of nervous system    Wheezing     Past Surgical History:  Procedure Laterality Date   ABDOMINAL HYSTERECTOMY     BARIATRIC SURGERY     CARDIAC CATHETERIZATION     with stent   CHOLECYSTECTOMY     COLON SURGERY     LEFT HEART CATH AND CORONARY ANGIOGRAPHY N/A 01/28/2018   Procedure: LEFT HEART CATH AND CORONARY ANGIOGRAPHY;  Surgeon: Dalia Heading, MD;  Location: [facility];  Service: Cardiovascular;  Laterality: N/A;   LEFT HEART CATH AND CORONARY ANGIOGRAPHY N/A 02/13/2019   Procedure: LEFT HEART CATH AND CORONARY ANGIOGRAPHY;  Surgeon: Marcina Millard, MD;  Location: [facility];  Service: Cardiovascular;  Laterality: N/A;    Current Outpatient Medications  Medication Sig Dispense Refill   acetaminophen (TYLENOL) 500 MG tablet Take 500-1,000 mg by mouth 2 (two) times daily as needed for moderate pain or headache.      ALPRAZolam (XANAX XR) 1 MG 24 hr tablet Take 1 mg by mouth daily.     aspirin 81 MG tablet Take 81 mg by mouth daily.      baclofen (LIORESAL) 10 MG tablet      clopidogrel (PLAVIX) 75 MG tablet Take 1 tablet (75 mg total) by mouth daily. 30 tablet 0   cyclobenzaprine (FLEXERIL) 5 MG tablet      DULoxetine (CYMBALTA) 20 MG capsule Take 20 mg by mouth daily.     DULoxetine (CYMBALTA) 20 MG capsule Take by mouth.     gabapentin (NEURONTIN) 300 MG capsule Take 600 mg by mouth at bedtime.      gabapentin (NEURONTIN) 300 MG  capsule Take 1 capsule by mouth 3 (three) times daily.     guaiFENesin (MUCINEX) 600 MG 12 hr tablet Take 1 tablet (600 mg total) by mouth 2 (two) times daily. 30 tablet 0   isosorbide mononitrate (IMDUR) 60 MG 24 hr tablet Take 1 tablet (60 mg total) by mouth daily. 30 tablet 0   metoprolol succinate (TOPROL-XL) 25 MG 24 hr tablet Take 25 mg by mouth daily.     montelukast (SINGULAIR) 10 MG tablet Take 10 mg by mouth daily.      nitroGLYCERIN (NITROSTAT) 0.4 MG SL tablet Place 1 tablet (0.4 mg total) under the tongue every 5 (five) minutes as needed for chest pain. 30 tablet 0   predniSONE (DELTASONE) 5 MG tablet Taper 5,5,4,4,3,3,2,2,1,1,off     rOPINIRole (REQUIP) 0.25 MG tablet      simvastatin (ZOCOR) 40 MG tablet Take 1 tablet (40 mg total) by mouth daily at 6 PM. 30 tablet 0   traZODone (DESYREL) 50 MG tablet Take 50 mg by mouth at bedtime.     No current facility-administered medications for this visit.    Allergies  Allergen Reactions   Amoxicillin     Has patient  had a PCN reaction causing immediate rash, facial/tongue/throat swelling, SOB or lightheadedness with hypotension: Yes Has patient had a PCN reaction causing severe rash involving mucus membranes or skin necrosis: No Has patient had a PCN reaction that required hospitalization: No Has patient had a PCN reaction occurring within the last 10 years: No If all of the above answers are "NO", then may proceed with Cephalosporin use.  Sore throat    Atorvastatin     Tremors     Indication: Patient presents with symptomatic varicose veins of the left lower extremity.  Procedure: Foam sclerotherapy was performed on the left lower extremity. Using ultrasound guidance, 5 mL of foam Sotradecol was used to inject the varicosities of the left lower extremity. Compression wraps were placed. The patient tolerated the procedure well.

## 2020-12-02 DIAGNOSIS — I83811 Varicose veins of right lower extremities with pain: Secondary | ICD-10-CM | POA: Diagnosis not present

## 2020-12-02 DIAGNOSIS — I10 Essential (primary) hypertension: Secondary | ICD-10-CM | POA: Diagnosis not present

## 2020-12-02 DIAGNOSIS — E1169 Type 2 diabetes mellitus with other specified complication: Secondary | ICD-10-CM | POA: Diagnosis not present

## 2020-12-02 DIAGNOSIS — R002 Palpitations: Secondary | ICD-10-CM | POA: Diagnosis not present

## 2020-12-02 DIAGNOSIS — I83813 Varicose veins of bilateral lower extremities with pain: Secondary | ICD-10-CM | POA: Diagnosis not present

## 2020-12-02 DIAGNOSIS — Z955 Presence of coronary angioplasty implant and graft: Secondary | ICD-10-CM | POA: Diagnosis not present

## 2020-12-02 DIAGNOSIS — I2102 ST elevation (STEMI) myocardial infarction involving left anterior descending coronary artery: Secondary | ICD-10-CM | POA: Diagnosis not present

## 2020-12-02 DIAGNOSIS — Z23 Encounter for immunization: Secondary | ICD-10-CM | POA: Diagnosis not present

## 2020-12-13 DIAGNOSIS — R0602 Shortness of breath: Secondary | ICD-10-CM | POA: Diagnosis not present

## 2020-12-14 DIAGNOSIS — R0602 Shortness of breath: Secondary | ICD-10-CM | POA: Diagnosis not present

## 2020-12-21 ENCOUNTER — Other Ambulatory Visit: Payer: Self-pay

## 2020-12-21 ENCOUNTER — Ambulatory Visit (INDEPENDENT_AMBULATORY_CARE_PROVIDER_SITE_OTHER): Payer: Medicare HMO | Admitting: Vascular Surgery

## 2020-12-21 ENCOUNTER — Encounter (INDEPENDENT_AMBULATORY_CARE_PROVIDER_SITE_OTHER): Payer: Self-pay | Admitting: Vascular Surgery

## 2020-12-21 VITALS — BP 116/74 | HR 61 | Resp 16 | Wt 184.4 lb

## 2020-12-21 DIAGNOSIS — E785 Hyperlipidemia, unspecified: Secondary | ICD-10-CM

## 2020-12-21 DIAGNOSIS — I83813 Varicose veins of bilateral lower extremities with pain: Secondary | ICD-10-CM

## 2020-12-21 DIAGNOSIS — I10 Essential (primary) hypertension: Secondary | ICD-10-CM | POA: Diagnosis not present

## 2020-12-21 NOTE — Assessment & Plan Note (Signed)
blood pressure control important in reducing the progression of atherosclerotic disease. On appropriate oral medications.  

## 2020-12-21 NOTE — Assessment & Plan Note (Signed)
lipid control important in reducing the progression of atherosclerotic disease. Continue statin therapy  

## 2020-12-21 NOTE — Progress Notes (Signed)
MRN : [id number]  Courtney Blanchard is a 65 y.o. ([birth year]) female who presents with chief complaint of  Chief Complaint  Patient presents with   Follow-up    4 wk post sclero  .  History of Present Illness: Patient returns today in follow up of her venous insufficiency.  She is status post multiple foam sclerotherapy treatments to the left leg over the past year.  This is resulted in marked improvement in the pain as well as the swelling in the left leg.  She still has extensive varicosities bilaterally but nowhere near as large or as extensive as they were previously on the left after multiple treatments.  Current Outpatient Medications  Medication Sig Dispense Refill   acetaminophen (TYLENOL) 500 MG tablet Take 500-1,000 mg by mouth 2 (two) times daily as needed for moderate pain or headache.      albuterol (VENTOLIN HFA) 108 (90 Base) MCG/ACT inhaler Inhale into the lungs.     ALPRAZolam (XANAX XR) 1 MG 24 hr tablet Take 1 mg by mouth daily.     aspirin 81 MG tablet Take 81 mg by mouth daily.      baclofen (LIORESAL) 10 MG tablet      clopidogrel (PLAVIX) 75 MG tablet Take 1 tablet (75 mg total) by mouth daily. 30 tablet 0   cyclobenzaprine (FLEXERIL) 5 MG tablet      DULoxetine (CYMBALTA) 20 MG capsule Take 20 mg by mouth daily.     DULoxetine (CYMBALTA) 20 MG capsule Take by mouth.     gabapentin (NEURONTIN) 300 MG capsule Take 600 mg by mouth at bedtime.      gabapentin (NEURONTIN) 300 MG capsule Take 1 capsule by mouth 3 (three) times daily.     guaiFENesin (MUCINEX) 600 MG 12 hr tablet Take 1 tablet (600 mg total) by mouth 2 (two) times daily. 30 tablet 0   isosorbide mononitrate (IMDUR) 60 MG 24 hr tablet Take 1 tablet (60 mg total) by mouth daily. 30 tablet 0   metoprolol succinate (TOPROL-XL) 25 MG 24 hr tablet Take 25 mg by mouth daily.     montelukast (SINGULAIR) 10 MG tablet Take 10 mg by mouth daily.      nitroGLYCERIN (NITROSTAT) 0.4 MG SL tablet Place 1 tablet (0.4  mg total) under the tongue every 5 (five) minutes as needed for chest pain. 30 tablet 0   predniSONE (DELTASONE) 5 MG tablet Taper 5,5,4,4,3,3,2,2,1,1,off     rOPINIRole (REQUIP) 0.25 MG tablet      simvastatin (ZOCOR) 40 MG tablet Take 1 tablet (40 mg total) by mouth daily at 6 PM. 30 tablet 0   traZODone (DESYREL) 50 MG tablet Take 50 mg by mouth at bedtime.     No current facility-administered medications for this visit.    Past Medical History:  Diagnosis Date   Anxiety    Asthma    Bronchitis    Chronic cough    Complication of anesthesia    nausea and vomiting   COPD (chronic obstructive pulmonary disease) (HCC)    Coronary artery disease    Depression    Diabetes mellitus without complication (HCC)    Diverticulosis    Dyspnea    Dysrhythmia    Environmental and seasonal allergies    Hypertension    Lower extremity edema    Tremors of nervous system    Wheezing     Past Surgical History:  Procedure Laterality Date   ABDOMINAL HYSTERECTOMY  BARIATRIC SURGERY     CARDIAC CATHETERIZATION     with stent   CHOLECYSTECTOMY     COLON SURGERY     LEFT HEART CATH AND CORONARY ANGIOGRAPHY N/A 01/28/2018   Procedure: LEFT HEART CATH AND CORONARY ANGIOGRAPHY;  Surgeon: Dalia Heading, MD;  Location: [facility];  Service: Cardiovascular;  Laterality: N/A;   LEFT HEART CATH AND CORONARY ANGIOGRAPHY N/A 02/13/2019   Procedure: LEFT HEART CATH AND CORONARY ANGIOGRAPHY;  Surgeon: Marcina Millard, MD;  Location: [facility];  Service: Cardiovascular;  Laterality: N/A;     Social History   Tobacco Use   Smoking status: Never   Smokeless tobacco: Never  Vaping Use   Vaping Use: Never used  Substance Use Topics   Alcohol use: No   Drug use: No       Family History  Problem Relation Age of Onset   Hypertension Mother    Heart disease Father    CAD Father    Heart disease Sister    Stroke Sister    Breast cancer Neg Hx      Allergies   Allergen Reactions   Amoxicillin     Has patient had a PCN reaction causing immediate rash, facial/tongue/throat swelling, SOB or lightheadedness with hypotension: Yes Has patient had a PCN reaction causing severe rash involving mucus membranes or skin necrosis: No Has patient had a PCN reaction that required hospitalization: No Has patient had a PCN reaction occurring within the last 10 years: No If all of the above answers are "NO", then may proceed with Cephalosporin use.  Sore throat    Atorvastatin     Tremors      REVIEW OF SYSTEMS (Negative unless checked)  Constitutional: [] Weight loss  [] Fever  [] Chills Cardiac: [] Chest pain   [] Chest pressure   [] Palpitations   [] Shortness of breath when laying flat   [] Shortness of breath at rest   [] Shortness of breath with exertion. Vascular:  [] Pain in legs with walking   [] Pain in legs at rest   [] Pain in legs when laying flat   [] Claudication   [] Pain in feet when walking  [] Pain in feet at rest  [] Pain in feet when laying flat   [] History of DVT   [] Phlebitis   [x] Swelling in legs   [x] Varicose veins   [] Non-healing ulcers Pulmonary:   [] Uses home oxygen   [] Productive cough   [] Hemoptysis   [] Wheeze  [] COPD   [] Asthma Neurologic:  [] Dizziness  [] Blackouts   [] Seizures   [] History of stroke   [] History of TIA  [] Aphasia   [] Temporary blindness   [] Dysphagia   [] Weakness or numbness in arms   [] Weakness or numbness in legs Musculoskeletal:  [x] Arthritis   [] Joint swelling   [] Joint pain   [] Low back pain Hematologic:  [] Easy bruising  [] Easy bleeding   [] Hypercoagulable state   [] Anemic   Gastrointestinal:  [] Blood in stool   [] Vomiting blood  [] Gastroesophageal reflux/heartburn   [] Abdominal pain Genitourinary:  [] Chronic kidney disease   [] Difficult urination  [] Frequent urination  [] Burning with urination   [] Hematuria Skin:  [] Rashes   [] Ulcers   [] Wounds Psychological:  [] History of anxiety   []  History of major  depression.  Physical Examination  BP 116/74 (BP Location: Right Arm)   Pulse 61   Resp 16   Wt 184 lb 6.4 oz (83.6 kg)   BMI 33.73 kg/m  Gen:  WD/WN, NAD Head: [place]/AT, No temporalis wasting. Ear/Nose/Throat: Hearing grossly  intact, nares w/o erythema or drainage Eyes: Conjunctiva clear. Sclera non-icteric Neck: Supple.  Trachea midline Pulmonary:  Good air movement, no use of accessory muscles.  Cardiac: RRR, no JVD Vascular:  Vessel Right Left  Radial Palpable Palpable                          PT Palpable Palpable  DP Palpable Palpable   Gastrointestinal: soft, non-tender/non-distended. No guarding/reflex.  Musculoskeletal: M/S 5/5 throughout.  No deformity or atrophy.  Diffuse varicosities are still present throughout both lower extremities.  Mild bilateral lower extremity edema. Neurologic: Sensation grossly intact in extremities.  Symmetrical.  Speech is fluent.  Psychiatric: Judgment intact, Mood & affect appropriate for pt's clinical situation. Dermatologic: No rashes or ulcers noted.  No cellulitis or open wounds.      Labs No results found for this or any previous visit (from the past 2160 hour(s)).  Radiology No results found.  Assessment/Plan  Varicose veins of leg with pain, bilateral At this point, the patient has had marked improvement in terms of her pain and swelling in her legs with multiple venous interventions and extensive foam sclerotherapy treatment over the past year.  She still has a good number of varicosities but with her symptoms so much better, I think at this point it would be reasonable just to have her come back as needed if her symptoms worsen.  Hypertension blood pressure control important in reducing the progression of atherosclerotic disease. On appropriate oral medications.   HLD (hyperlipidemia) lipid control important in reducing the progression of atherosclerotic disease. Continue statin therapy    Festus Barren,  MD  12/21/2020 11:17 AM    This note was created with Dragon medical transcription system.  Any errors from dictation are purely unintentional

## 2020-12-21 NOTE — Assessment & Plan Note (Signed)
At this point, the patient has had marked improvement in terms of her pain and swelling in her legs with multiple venous interventions and extensive foam sclerotherapy treatment over the past year.  She still has a good number of varicosities but with her symptoms so much better, I think at this point it would be reasonable just to have her come back as needed if her symptoms worsen.

## 2021-01-06 DIAGNOSIS — J849 Interstitial pulmonary disease, unspecified: Secondary | ICD-10-CM | POA: Diagnosis not present

## 2021-01-06 DIAGNOSIS — R0609 Other forms of dyspnea: Secondary | ICD-10-CM | POA: Diagnosis not present

## 2021-01-07 ENCOUNTER — Other Ambulatory Visit: Payer: Self-pay | Admitting: Specialist

## 2021-01-07 ENCOUNTER — Other Ambulatory Visit (HOSPITAL_COMMUNITY): Payer: Self-pay | Admitting: Specialist

## 2021-01-07 DIAGNOSIS — J849 Interstitial pulmonary disease, unspecified: Secondary | ICD-10-CM

## 2021-01-11 ENCOUNTER — Ambulatory Visit: Payer: Medicare HMO | Attending: Specialist

## 2021-02-18 DIAGNOSIS — Z23 Encounter for immunization: Secondary | ICD-10-CM | POA: Diagnosis not present

## 2021-03-10 DIAGNOSIS — H524 Presbyopia: Secondary | ICD-10-CM | POA: Diagnosis not present

## 2021-03-10 DIAGNOSIS — E119 Type 2 diabetes mellitus without complications: Secondary | ICD-10-CM | POA: Diagnosis not present

## 2021-03-13 DIAGNOSIS — Z9841 Cataract extraction status, right eye: Secondary | ICD-10-CM

## 2021-03-13 DIAGNOSIS — Z9842 Cataract extraction status, left eye: Secondary | ICD-10-CM

## 2021-03-13 HISTORY — DX: Cataract extraction status, right eye: Z98.42

## 2021-03-13 HISTORY — DX: Cataract extraction status, right eye: Z98.41

## 2021-03-21 DIAGNOSIS — H2512 Age-related nuclear cataract, left eye: Secondary | ICD-10-CM | POA: Diagnosis not present

## 2021-03-22 ENCOUNTER — Encounter: Payer: Self-pay | Admitting: Ophthalmology

## 2021-03-23 NOTE — Anesthesia Preprocedure Evaluation (Addendum)
Anesthesia Evaluation  Patient identified by MRN, date of birth, ID band Patient awake    History of Anesthesia Complications (+) PONV and history of anesthetic complications  Airway Mallampati: III  TM Distance: >3 FB Neck ROM: Full    Dental  (+)    Pulmonary asthma , COPD,  COPD inhaler,    Pulmonary exam normal        Cardiovascular Exercise Tolerance: Good hypertension, + CAD, + Past MI and + Cardiac Stents  Normal cardiovascular exam  The patient presented to [facility] ED 02/13/2019 with anterior ST elevation myocardial infarction. Cardiac catheterization revealed patent stents RCA with evidence for spontaneous coronary artery dissection mid LAD, treated conservatively with dual antiplatelet therapy. 2D echocardiogram 02/13/2019 revealed mild just left ventricular function, with LVEF 40 to 45%, with apical akinesis. Patient was readmitted 02/19/2019 with recurrent chest pain without new ECG changes, with downtrending high-sensitivity troponin. The patient again was treated conservatively with medical therapy.     Neuro/Psych    GI/Hepatic negative GI ROS, Neg liver ROS,   Endo/Other  diabetes, Well Controlled, Type 2  Renal/GU negative Renal ROS     Musculoskeletal   Abdominal   Peds  Hematology negative hematology ROS (+)   Anesthesia Other Findings   Reproductive/Obstetrics                            Anesthesia Physical Anesthesia Plan  ASA: 3  Anesthesia Plan: MAC   Post-op Pain Management: Minimal or no pain anticipated   Induction: Intravenous  PONV Risk Score and Plan: 3 and TIVA, Midazolam and Treatment may vary due to age or medical condition  Airway Management Planned: Nasal Cannula and Natural Airway  Additional Equipment: None  Intra-op Plan:   Post-operative Plan:   Informed Consent: I have reviewed the patients History and Physical, chart, labs and discussed the  procedure including the risks, benefits and alternatives for the proposed anesthesia with the patient or authorized representative who has indicated his/her understanding and acceptance.       Plan Discussed with: CRNA  Anesthesia Plan Comments:         Anesthesia Quick Evaluation

## 2021-03-28 NOTE — Discharge Instructions (Addendum)

## 2021-03-30 ENCOUNTER — Encounter
Admission: RE | Disposition: A | Discharge status: Home / Self Care | Payer: Self-pay | Source: Home / Self Care | Attending: Ophthalmology

## 2021-03-30 ENCOUNTER — Ambulatory Visit: Payer: Medicare HMO | Admitting: Anesthesiology

## 2021-03-30 ENCOUNTER — Ambulatory Visit
Admission: RE | Admit: 2021-03-30 | Discharge: 2021-03-30 | Disposition: A | Discharge status: Home / Self Care | Payer: Medicare HMO | Attending: Ophthalmology | Admitting: Ophthalmology

## 2021-03-30 ENCOUNTER — Other Ambulatory Visit: Payer: Self-pay

## 2021-03-30 ENCOUNTER — Encounter: Payer: Self-pay | Admitting: Ophthalmology

## 2021-03-30 DIAGNOSIS — I2510 Atherosclerotic heart disease of native coronary artery without angina pectoris: Secondary | ICD-10-CM | POA: Insufficient documentation

## 2021-03-30 DIAGNOSIS — I252 Old myocardial infarction: Secondary | ICD-10-CM | POA: Insufficient documentation

## 2021-03-30 DIAGNOSIS — H2512 Age-related nuclear cataract, left eye: Secondary | ICD-10-CM | POA: Diagnosis not present

## 2021-03-30 DIAGNOSIS — Z955 Presence of coronary angioplasty implant and graft: Secondary | ICD-10-CM | POA: Insufficient documentation

## 2021-03-30 DIAGNOSIS — I10 Essential (primary) hypertension: Secondary | ICD-10-CM | POA: Diagnosis not present

## 2021-03-30 DIAGNOSIS — J449 Chronic obstructive pulmonary disease, unspecified: Secondary | ICD-10-CM | POA: Insufficient documentation

## 2021-03-30 DIAGNOSIS — H25812 Combined forms of age-related cataract, left eye: Secondary | ICD-10-CM | POA: Diagnosis not present

## 2021-03-30 DIAGNOSIS — E1136 Type 2 diabetes mellitus with diabetic cataract: Secondary | ICD-10-CM | POA: Insufficient documentation

## 2021-03-30 HISTORY — DX: Presence of dental prosthetic device (complete) (partial): Z97.2

## 2021-03-30 HISTORY — PX: CATARACT EXTRACTION W/PHACO: SHX586

## 2021-03-30 HISTORY — DX: Migraine, unspecified, not intractable, without status migrainosus: G43.909

## 2021-03-30 SURGERY — PHACOEMULSIFICATION, CATARACT, WITH IOL INSERTION
Anesthesia: Monitor Anesthesia Care | Site: Eye | Laterality: Left

## 2021-03-30 MED ORDER — ARMC OPHTHALMIC DILATING DROPS
1.0000 "application " | OPHTHALMIC | Status: DC | PRN
  Administered 2021-03-30 (×3): 1 via OPHTHALMIC

## 2021-03-30 MED ORDER — TETRACAINE HCL 0.5 % OP SOLN
1.0000 [drp] | OPHTHALMIC | Status: DC | PRN
  Administered 2021-03-30 (×3): 1 [drp] via OPHTHALMIC

## 2021-03-30 MED ORDER — ONDANSETRON 8 MG PO TBDP
8.0000 mg | ORAL_TABLET | Freq: Once | ORAL | Status: AC
  Administered 2021-03-30: 4 mg via ORAL

## 2021-03-30 MED ORDER — MIDAZOLAM HCL 2 MG/2ML IJ SOLN
INTRAMUSCULAR | Status: DC | PRN
  Administered 2021-03-30: 2 mg via INTRAVENOUS

## 2021-03-30 MED ORDER — SIGHTPATH DOSE#1 BSS IO SOLN
INTRAOCULAR | Status: DC | PRN
  Administered 2021-03-30: 1 mL

## 2021-03-30 MED ORDER — BRIMONIDINE TARTRATE-TIMOLOL 0.2-0.5 % OP SOLN
OPHTHALMIC | Status: DC | PRN
  Administered 2021-03-30: 1 [drp] via OPHTHALMIC

## 2021-03-30 MED ORDER — DEXMEDETOMIDINE (PRECEDEX) IN NS 20 MCG/5ML (4 MCG/ML) IV SYRINGE
PREFILLED_SYRINGE | INTRAVENOUS | Status: DC | PRN
  Administered 2021-03-30: 10 ug via INTRAVENOUS

## 2021-03-30 MED ORDER — LACTATED RINGERS IV SOLN: INTRAVENOUS | Status: DC

## 2021-03-30 MED ORDER — MOXIFLOXACIN HCL 0.5 % OP SOLN
OPHTHALMIC | Status: DC | PRN
  Administered 2021-03-30: 0.2 mL via OPHTHALMIC

## 2021-03-30 MED ORDER — FENTANYL CITRATE (PF) 100 MCG/2ML IJ SOLN
INTRAMUSCULAR | Status: DC | PRN
Start: 2021-03-30 — End: 2021-03-30
  Administered 2021-03-30 (×2): 50 ug via INTRAVENOUS

## 2021-03-30 MED ORDER — SIGHTPATH DOSE#1 BSS IO SOLN
INTRAOCULAR | Status: DC | PRN
  Administered 2021-03-30: 59 mL via OPHTHALMIC

## 2021-03-30 MED ORDER — SIGHTPATH DOSE#1 BSS IO SOLN
INTRAOCULAR | Status: DC | PRN
  Administered 2021-03-30: 15 mL

## 2021-03-30 MED ORDER — SIGHTPATH DOSE#1 NA HYALUR & NA CHOND-NA HYALUR IO KIT
PACK | INTRAOCULAR | Status: DC | PRN
  Administered 2021-03-30: 1 via OPHTHALMIC

## 2021-03-30 SURGICAL SUPPLY — 21 items
CANNULA ANT/CHMB 27G (MISCELLANEOUS) IMPLANT
CANNULA ANT/CHMB 27GA (MISCELLANEOUS) IMPLANT
CATARACT SUITE SIGHTPATH (MISCELLANEOUS) ×2 IMPLANT
FEE CATARACT SUITE SIGHTPATH (MISCELLANEOUS) ×1 IMPLANT
GLOVE SRG 8 PF TXTR STRL LF DI (GLOVE) ×1 IMPLANT
GLOVE SURG ENC TEXT LTX SZ7.5 (GLOVE) ×2 IMPLANT
GLOVE SURG GAMMEX PI TX LF 7.5 (GLOVE) IMPLANT
GLOVE SURG UNDER POLY LF SZ8 (GLOVE) ×2
LENS IOL TECNIS EYHANCE 23.0 (Intraocular Lens) ×1 IMPLANT
NDL FILTER BLUNT 18X1 1/2 (NEEDLE) ×1 IMPLANT
NDL RETROBULBAR .5 NSTRL (NEEDLE) IMPLANT
NEEDLE FILTER BLUNT 18X 1/2SAF (NEEDLE) ×1
NEEDLE FILTER BLUNT 18X1 1/2 (NEEDLE) ×1 IMPLANT
PACK VIT ANT 23G (MISCELLANEOUS) IMPLANT
RING MALYGIN 7.0 (MISCELLANEOUS) IMPLANT
SUT ETHILON 10-0 CS-B-6CS-B-6 (SUTURE)
SUT VICRYL  9 0 (SUTURE)
SUT VICRYL 9 0 (SUTURE) IMPLANT
SUTURE EHLN 10-0 CS-B-6CS-B-6 (SUTURE) IMPLANT
SYR 3ML LL SCALE MARK (SYRINGE) ×2 IMPLANT
WATER STERILE IRR 250ML POUR (IV SOLUTION) ×2 IMPLANT

## 2021-03-30 NOTE — Anesthesia Postprocedure Evaluation (Signed)
Anesthesia Post Note  Patient: Courtney Blanchard  Procedure(s) Performed: CATARACT EXTRACTION PHACO AND INTRAOCULAR LENS PLACEMENT (IOC) LEFT DIABETIC (Left: Eye)     Patient location during evaluation: PACU Anesthesia Type: MAC Level of consciousness: awake and alert Pain management: pain level controlled Vital Signs Assessment: post-procedure vital signs reviewed and stable Respiratory status: spontaneous breathing Cardiovascular status: blood pressure returned to baseline Postop Assessment: no apparent nausea or vomiting, adequate PO intake and no headache Anesthetic complications: no   No notable events documented.  Adele Barthel Charlie Char

## 2021-03-30 NOTE — Op Note (Signed)
OPERATIVE NOTE  [name] [id number] 03/30/2021   PREOPERATIVE DIAGNOSIS:  Nuclear sclerotic cataract left eye. H25.12   POSTOPERATIVE DIAGNOSIS:    Nuclear sclerotic cataract left eye.     PROCEDURE:  Phacoemusification with posterior chamber intraocular lens placement of the left eye  Ultrasound time: Procedure(s) with comments: CATARACT EXTRACTION PHACO AND INTRAOCULAR LENS PLACEMENT (IOC) LEFT DIABETIC (Left) - 3.39 0:31.6  LENS:   Implant Name Type Inv. Item Serial No. Manufacturer Lot No. LRB No. Used Action  LENS IOL TECNIS EYHANCE 23.0 - [id number] Intraocular Lens LENS IOL TECNIS EYHANCE 23.0 [id number] [employer]  Left 1 Implanted      SURGEON:  Deirdre Evener, MD   ANESTHESIA:  Topical with tetracaine drops and 2% Xylocaine jelly, augmented with 1% preservative-free intracameral lidocaine.    COMPLICATIONS:  None.   DESCRIPTION OF PROCEDURE:  The patient was identified in the holding room and transported to the operating room and placed in the supine position under the operating microscope.  The left eye was identified as the operative eye and it was prepped and draped in the usual sterile ophthalmic fashion.   A 1 millimeter clear-corneal paracentesis was made at the 1:30 position.  0.5 ml of preservative-free 1% lidocaine was injected into the anterior chamber.  The anterior chamber was filled with Viscoat viscoelastic.  A 2.4 millimeter keratome was used to make a near-clear corneal incision at the 10:30 position.  .  A curvilinear capsulorrhexis was made with a cystotome and capsulorrhexis forceps.  Balanced salt solution was used to hydrodissect and hydrodelineate the nucleus.   Phacoemulsification was then used in stop and chop fashion to remove the lens nucleus and epinucleus.  The remaining cortex was then removed using the irrigation and aspiration handpiece. Provisc was then placed into the capsular bag to distend it for lens placement.  A lens was  then injected into the capsular bag.  The remaining viscoelastic was aspirated.   Wounds were hydrated with balanced salt solution.  The anterior chamber was inflated to a physiologic pressure with balanced salt solution.  No wound leaks were noted. Vigamox 0.2 ml of a 1mg  per ml solution was injected into the anterior chamber for a dose of 0.2 mg of intracameral antibiotic at the completion of the case.   Timolol and Brimonidine drops were applied to the eye.  The patient was taken to the recovery room in stable condition without complications of anesthesia or surgery.  Courtney Blanchard 03/30/2021, 8:06 AM

## 2021-03-30 NOTE — H&P (Signed)
[facility]   Primary Care Physician:  Maryland Pink, MD Ophthalmologist: Dr. Leandrew Koyanagi  Pre-Procedure History & Physical: HPI:  Courtney Blanchard is a 66 y.o. female here for ophthalmic surgery.   Past Medical History:  Diagnosis Date   Anxiety    Aortic dissection ([place]) 02/13/2019   LAD   Asthma    Bronchitis    Chronic cough    Complication of anesthesia    nausea and vomiting   COPD (chronic obstructive pulmonary disease) (HCC)    Coronary artery disease    Depression    Diabetes mellitus without complication ([place])    Diet controlled   Diverticulosis    Dyspnea    Dysrhythmia    Environmental and seasonal allergies    Hypertension    Lower extremity edema    Migraine headache    2-4X/month   STEMI (ST elevation myocardial infarction) ([place]) 02/13/2019   Tremors of nervous system    Wears dentures    Partial upper   Wheezing     Past Surgical History:  Procedure Laterality Date   ABDOMINAL HYSTERECTOMY     BARIATRIC SURGERY     CARDIAC CATHETERIZATION     with stent   CHOLECYSTECTOMY     COLON SURGERY     LEFT HEART CATH AND CORONARY ANGIOGRAPHY N/A 01/28/2018   Procedure: LEFT HEART CATH AND CORONARY ANGIOGRAPHY;  Surgeon: Teodoro Spray, MD;  Location: [place] [facility];  Service: Cardiovascular;  Laterality: N/A;   LEFT HEART CATH AND CORONARY ANGIOGRAPHY N/A 02/13/2019   Procedure: LEFT HEART CATH AND CORONARY ANGIOGRAPHY;  Surgeon: Isaias Cowman, MD;  Location: [place] [facility];  Service: Cardiovascular;  Laterality: N/A;    Prior to Admission medications   Medication Sig Start Date End Date Taking? Authorizing Provider  acetaminophen (TYLENOL) 500 MG tablet Take 500-1,000 mg by mouth 2 (two) times daily as needed for moderate pain or headache.    Yes [provider]  albuterol (VENTOLIN HFA) 108 (90 Base) MCG/ACT inhaler Inhale into the lungs. 12/13/20 12/13/21 Yes [provider]  ALPRAZolam (XANAX XR)  1 MG 24 hr tablet Take 1 mg by mouth daily. 02/11/19  Yes [provider]  aspirin 81 MG tablet Take 81 mg by mouth daily.    Yes [provider]  clopidogrel (PLAVIX) 75 MG tablet Take 1 tablet (75 mg total) by mouth daily. 02/17/19  Yes Lavina Hamman, MD  DULoxetine (CYMBALTA) 20 MG capsule Take 20 mg by mouth daily. 02/04/19  Yes [provider]  DULoxetine (CYMBALTA) 20 MG capsule Take 40 mg by mouth. 09/08/20  Yes [provider]  gabapentin (NEURONTIN) 300 MG capsule Take 1 capsule by mouth 3 (three) times daily. 08/03/20 08/03/21 Yes [provider]  isosorbide mononitrate (IMDUR) 60 MG 24 hr tablet Take 1 tablet (60 mg total) by mouth daily. Patient taking differently: Take 30 mg by mouth in the morning and at bedtime. 02/21/19  Yes Wieting, Richard, MD  montelukast (SINGULAIR) 10 MG tablet Take 10 mg by mouth daily.    Yes [provider]  nitroGLYCERIN (NITROSTAT) 0.4 MG SL tablet Place 1 tablet (0.4 mg total) under the tongue every 5 (five) minutes as needed for chest pain. 02/17/19  Yes Lavina Hamman, MD  rOPINIRole (REQUIP) 0.25 MG tablet 0.5 mg at bedtime. 05/20/19  Yes [provider]  baclofen (LIORESAL) 10 MG tablet  07/04/19   [provider]  cyclobenzaprine (FLEXERIL) 5 MG  tablet  06/27/19   [provider]    Allergies as of 03/11/2021 - Review Complete 12/21/2020  Allergen Reaction Noted   Amoxicillin  08/31/2016   Atorvastatin  02/01/2018    Family History  Problem Relation Age of Onset   Hypertension Mother    Heart disease Father    CAD Father    Heart disease Sister    Stroke Sister    Breast cancer Neg Hx     Social History   Socioeconomic History   Marital status: Married    Spouse name: Not on file   Number of children: Not on file   Years of education: Not on file   Highest education level: Not on file  Occupational History   Not on file  Tobacco Use   Smoking status:  Never   Smokeless tobacco: Never  Vaping Use   Vaping Use: Never used  Substance and Sexual Activity   Alcohol use: No   Drug use: No   Sexual activity: Not Currently  Other Topics Concern   Not on file  Social History Narrative   Not on file   Social Determinants of Health   Financial Resource Strain: Not on file  Food Insecurity: Not on file  Transportation Needs: Not on file  Physical Activity: Not on file  Stress: Not on file  Social Connections: Not on file  Intimate Partner Violence: Not on file    Review of Systems: See HPI, otherwise negative ROS  Physical Exam: BP 111/70    Pulse (!) 55    Temp 97.7 F (36.5 C) (Temporal)    Resp 16    Ht 5\' 2"  (1.575 m)    Wt 82.1 kg    SpO2 99%    BMI 33.11 kg/m  General:   Alert,  pleasant and cooperative in NAD Head:  Normocephalic and atraumatic. Lungs:  Clear to auscultation.    Heart:  Regular rate and rhythm.   Impression/Plan: Courtney Blanchard is here for ophthalmic surgery.  Risks, benefits, limitations, and alternatives regarding ophthalmic surgery have been reviewed with the patient.  Questions have been answered.  All parties agreeable.   Leandrew Koyanagi, MD  03/30/2021, 7:35 AM

## 2021-03-30 NOTE — Anesthesia Procedure Notes (Signed)
Procedure Name: MAC Date/Time: 03/30/2021 7:58 AM Performed by: Jeannene Patella, CRNA Pre-anesthesia Checklist: Patient identified, Emergency Drugs available, Suction available, Timeout performed and Patient being monitored Patient Re-evaluated:Patient Re-evaluated prior to induction Oxygen Delivery Method: Nasal cannula Placement Confirmation: positive ETCO2

## 2021-03-30 NOTE — Transfer of Care (Signed)
Immediate Anesthesia Transfer of Care Note  Patient: Courtney Blanchard  Procedure(s) Performed: CATARACT EXTRACTION PHACO AND INTRAOCULAR LENS PLACEMENT (IOC) LEFT DIABETIC (Left: Eye)  Patient Location: PACU  Anesthesia Type: MAC  Level of Consciousness: awake, alert  and patient cooperative  Airway and Oxygen Therapy: Patient Spontanous Breathing and Patient connected to supplemental oxygen  Post-op Assessment: Post-op Vital signs reviewed, Patient's Cardiovascular Status Stable, Respiratory Function Stable, Patent Airway and No signs of Nausea or vomiting  Post-op Vital Signs: Reviewed and stable  Complications: No notable events documented.

## 2021-03-31 ENCOUNTER — Encounter: Payer: Self-pay | Admitting: Ophthalmology

## 2021-04-05 DIAGNOSIS — H2511 Age-related nuclear cataract, right eye: Secondary | ICD-10-CM | POA: Diagnosis not present

## 2021-04-11 NOTE — Discharge Instructions (Signed)

## 2021-04-13 ENCOUNTER — Ambulatory Visit: Payer: Medicare HMO | Admitting: Anesthesiology

## 2021-04-13 ENCOUNTER — Encounter: Payer: Self-pay | Admitting: Ophthalmology

## 2021-04-13 ENCOUNTER — Encounter
Admission: RE | Disposition: A | Discharge status: Home / Self Care | Payer: Self-pay | Source: Home / Self Care | Attending: Ophthalmology

## 2021-04-13 ENCOUNTER — Other Ambulatory Visit: Payer: Self-pay

## 2021-04-13 ENCOUNTER — Ambulatory Visit
Admission: RE | Admit: 2021-04-13 | Discharge: 2021-04-13 | Disposition: A | Discharge status: Home / Self Care | Payer: Medicare HMO | Attending: Ophthalmology | Admitting: Ophthalmology

## 2021-04-13 DIAGNOSIS — H25811 Combined forms of age-related cataract, right eye: Secondary | ICD-10-CM | POA: Diagnosis not present

## 2021-04-13 DIAGNOSIS — I10 Essential (primary) hypertension: Secondary | ICD-10-CM | POA: Diagnosis not present

## 2021-04-13 DIAGNOSIS — I252 Old myocardial infarction: Secondary | ICD-10-CM | POA: Insufficient documentation

## 2021-04-13 DIAGNOSIS — J449 Chronic obstructive pulmonary disease, unspecified: Secondary | ICD-10-CM | POA: Insufficient documentation

## 2021-04-13 DIAGNOSIS — H2511 Age-related nuclear cataract, right eye: Secondary | ICD-10-CM | POA: Diagnosis not present

## 2021-04-13 DIAGNOSIS — F32A Depression, unspecified: Secondary | ICD-10-CM | POA: Insufficient documentation

## 2021-04-13 DIAGNOSIS — I2510 Atherosclerotic heart disease of native coronary artery without angina pectoris: Secondary | ICD-10-CM | POA: Diagnosis not present

## 2021-04-13 DIAGNOSIS — E669 Obesity, unspecified: Secondary | ICD-10-CM | POA: Insufficient documentation

## 2021-04-13 DIAGNOSIS — E1136 Type 2 diabetes mellitus with diabetic cataract: Secondary | ICD-10-CM | POA: Diagnosis not present

## 2021-04-13 DIAGNOSIS — Z6839 Body mass index (BMI) 39.0-39.9, adult: Secondary | ICD-10-CM | POA: Diagnosis not present

## 2021-04-13 DIAGNOSIS — F419 Anxiety disorder, unspecified: Secondary | ICD-10-CM | POA: Insufficient documentation

## 2021-04-13 DIAGNOSIS — Z955 Presence of coronary angioplasty implant and graft: Secondary | ICD-10-CM | POA: Diagnosis not present

## 2021-04-13 HISTORY — PX: CATARACT EXTRACTION W/PHACO: SHX586

## 2021-04-13 SURGERY — PHACOEMULSIFICATION, CATARACT, WITH IOL INSERTION
Anesthesia: Monitor Anesthesia Care | Site: Eye | Laterality: Right

## 2021-04-13 MED ORDER — SIGHTPATH DOSE#1 BSS IO SOLN
INTRAOCULAR | Status: DC | PRN
  Administered 2021-04-13: 62 mL via OPHTHALMIC

## 2021-04-13 MED ORDER — ONDANSETRON HCL 4 MG/2ML IJ SOLN: 4.0000 mg | Freq: Once | INTRAMUSCULAR | Status: DC | PRN

## 2021-04-13 MED ORDER — ARMC OPHTHALMIC DILATING DROPS
1.0000 "application " | OPHTHALMIC | Status: DC | PRN
  Administered 2021-04-13 (×3): 1 via OPHTHALMIC

## 2021-04-13 MED ORDER — ONDANSETRON HCL 4 MG/2ML IJ SOLN
INTRAMUSCULAR | Status: DC | PRN
  Administered 2021-04-13: 4 mg via INTRAVENOUS

## 2021-04-13 MED ORDER — SIGHTPATH DOSE#1 BSS IO SOLN
INTRAOCULAR | Status: DC | PRN
  Administered 2021-04-13: 15 mL

## 2021-04-13 MED ORDER — DEXMEDETOMIDINE (PRECEDEX) IN NS 20 MCG/5ML (4 MCG/ML) IV SYRINGE
PREFILLED_SYRINGE | INTRAVENOUS | Status: DC | PRN
  Administered 2021-04-13: 10 ug via INTRAVENOUS

## 2021-04-13 MED ORDER — MOXIFLOXACIN HCL 0.5 % OP SOLN
OPHTHALMIC | Status: DC | PRN
  Administered 2021-04-13: 0.2 mL via OPHTHALMIC

## 2021-04-13 MED ORDER — DEXAMETHASONE SODIUM PHOSPHATE 4 MG/ML IJ SOLN
INTRAMUSCULAR | Status: DC | PRN
  Administered 2021-04-13: 4 mg via INTRAVENOUS

## 2021-04-13 MED ORDER — TETRACAINE HCL 0.5 % OP SOLN
1.0000 [drp] | OPHTHALMIC | Status: DC | PRN
  Administered 2021-04-13 (×3): 1 [drp] via OPHTHALMIC

## 2021-04-13 MED ORDER — SIGHTPATH DOSE#1 BSS IO SOLN
INTRAOCULAR | Status: DC | PRN
  Administered 2021-04-13: 1 mL via INTRAMUSCULAR

## 2021-04-13 MED ORDER — ACETAMINOPHEN 160 MG/5ML PO SOLN: 325.0000 mg | ORAL | Status: DC | PRN

## 2021-04-13 MED ORDER — ACETAMINOPHEN 325 MG PO TABS: 325.0000 mg | ORAL_TABLET | ORAL | Status: DC | PRN

## 2021-04-13 MED ORDER — FENTANYL CITRATE (PF) 100 MCG/2ML IJ SOLN
INTRAMUSCULAR | Status: DC | PRN
  Administered 2021-04-13 (×2): 50 ug via INTRAVENOUS

## 2021-04-13 MED ORDER — BRIMONIDINE TARTRATE 0.2 % OP SOLN
OPHTHALMIC | Status: DC | PRN
Start: 2021-04-13 — End: 2021-04-13
  Administered 2021-04-13: 1 [drp] via OPHTHALMIC

## 2021-04-13 MED ORDER — MIDAZOLAM HCL 2 MG/2ML IJ SOLN
INTRAMUSCULAR | Status: DC | PRN
  Administered 2021-04-13: 2 mg via INTRAVENOUS

## 2021-04-13 SURGICAL SUPPLY — 11 items
CATARACT SUITE SIGHTPATH (MISCELLANEOUS) ×2 IMPLANT
FEE CATARACT SUITE SIGHTPATH (MISCELLANEOUS) ×1 IMPLANT
GLOVE SRG 8 PF TXTR STRL LF DI (GLOVE) ×1 IMPLANT
GLOVE SURG ENC TEXT LTX SZ7.5 (GLOVE) ×2 IMPLANT
GLOVE SURG UNDER POLY LF SZ8 (GLOVE) ×2
LENS IOL TECNIS EYHANCE 22.5 (Intraocular Lens) ×1 IMPLANT
NDL FILTER BLUNT 18X1 1/2 (NEEDLE) ×1 IMPLANT
NEEDLE FILTER BLUNT 18X 1/2SAF (NEEDLE) ×1
NEEDLE FILTER BLUNT 18X1 1/2 (NEEDLE) ×1 IMPLANT
SYR 3ML LL SCALE MARK (SYRINGE) ×2 IMPLANT
WATER STERILE IRR 250ML POUR (IV SOLUTION) ×2 IMPLANT

## 2021-04-13 NOTE — Transfer of Care (Signed)
Immediate Anesthesia Transfer of Care Note  Patient: Courtney Blanchard  Procedure(s) Performed: CATARACT EXTRACTION PHACO AND INTRAOCULAR LENS PLACEMENT (IOC) RIGHT DIABETIC (Right: Eye)  Patient Location: PACU  Anesthesia Type: MAC  Level of Consciousness: awake, alert  and patient cooperative  Airway and Oxygen Therapy: Patient Spontanous Breathing and Patient connected to supplemental oxygen  Post-op Assessment: Post-op Vital signs reviewed, Patient's Cardiovascular Status Stable, Respiratory Function Stable, Patent Airway and No signs of Nausea or vomiting  Post-op Vital Signs: Reviewed and stable  Complications: No notable events documented.

## 2021-04-13 NOTE — Op Note (Signed)
LOCATION:  [facility]   PREOPERATIVE DIAGNOSIS:    Nuclear sclerotic cataract right eye. H25.11   POSTOPERATIVE DIAGNOSIS:  Nuclear sclerotic cataract right eye.     PROCEDURE:  Phacoemusification with posterior chamber intraocular lens placement of the right eye   ULTRASOUND TIME: Procedure(s): CATARACT EXTRACTION PHACO AND INTRAOCULAR LENS PLACEMENT (IOC) RIGHT DIABETIC (Right)  LENS:   Implant Name Type Inv. Item Serial No. Manufacturer Lot No. LRB No. Used Action  LENS IOL TECNIS EYHANCE 22.5 - [id number] Intraocular Lens LENS IOL TECNIS EYHANCE 22.5 [id number] [employer]  Right 1 Implanted         SURGEON:  Deirdre Evener, MD   ANESTHESIA:  Topical with tetracaine drops and 2% Xylocaine jelly, augmented with 1% preservative-free intracameral lidocaine.    COMPLICATIONS:  None.   DESCRIPTION OF PROCEDURE:  The patient was identified in the holding room and transported to the operating room and placed in the supine position under the operating microscope.  The right eye was identified as the operative eye and it was prepped and draped in the usual sterile ophthalmic fashion.   A 1 millimeter clear-corneal paracentesis was made at the 12:00 position.  0.5 ml of preservative-free 1% lidocaine was injected into the anterior chamber. The anterior chamber was filled with Viscoat viscoelastic.  A 2.4 millimeter keratome was used to make a near-clear corneal incision at the 9:00 position.  A curvilinear capsulorrhexis was made with a cystotome and capsulorrhexis forceps.  Balanced salt solution was used to hydrodissect and hydrodelineate the nucleus.   Phacoemulsification was then used in stop and chop fashion to remove the lens nucleus and epinucleus.  The remaining cortex was then removed using the irrigation and aspiration handpiece. Provisc was then placed into the capsular bag to distend it for lens placement.  A lens was then injected into the capsular bag.  The  remaining viscoelastic was aspirated.   Wounds were hydrated with balanced salt solution.  The anterior chamber was inflated to a physiologic pressure with balanced salt solution.  No wound leaks were noted. Vigamox 0.2 ml of a 1mg  per ml solution was injected into the anterior chamber for a dose of 0.2 mg of intracameral antibiotic at the completion of the case.   Timolol and Brimonidine drops were applied to the eye.  The patient was taken to the recovery room in stable condition without complications of anesthesia or surgery.   Dajanique Robley 04/13/2021, 8:31 AM

## 2021-04-13 NOTE — H&P (Signed)
[facility]   Primary Care Physician:  Jerl Mina, MD Ophthalmologist: Dr. Lockie Mola  Pre-Procedure History & Physical: HPI:  Courtney Blanchard is a 66 y.o. female here for ophthalmic surgery.   Past Medical History:  Diagnosis Date   Anxiety    Aortic dissection (HCC) 02/13/2019   LAD   Asthma    Bronchitis    Chronic cough    Complication of anesthesia    nausea and vomiting   COPD (chronic obstructive pulmonary disease) (HCC)    Coronary artery disease    Depression    Diabetes mellitus without complication (HCC)    Diet controlled   Diverticulosis    Dyspnea    Dysrhythmia    Environmental and seasonal allergies    Hypertension    Lower extremity edema    Migraine headache    2-4X/month   STEMI (ST elevation myocardial infarction) (HCC) 02/13/2019   Tremors of nervous system    Wears dentures    Partial upper   Wheezing     Past Surgical History:  Procedure Laterality Date   ABDOMINAL HYSTERECTOMY     BARIATRIC SURGERY     CARDIAC CATHETERIZATION     with stent   CATARACT EXTRACTION W/PHACO Left 03/30/2021   Procedure: CATARACT EXTRACTION PHACO AND INTRAOCULAR LENS PLACEMENT (IOC) LEFT DIABETIC;  Surgeon: Lockie Mola, MD;  Location: [facility];  Service: Ophthalmology;  Laterality: Left;  3.39 0:31.6   CHOLECYSTECTOMY     COLON SURGERY     LEFT HEART CATH AND CORONARY ANGIOGRAPHY N/A 01/28/2018   Procedure: LEFT HEART CATH AND CORONARY ANGIOGRAPHY;  Surgeon: Dalia Heading, MD;  Location: [facility];  Service: Cardiovascular;  Laterality: N/A;   LEFT HEART CATH AND CORONARY ANGIOGRAPHY N/A 02/13/2019   Procedure: LEFT HEART CATH AND CORONARY ANGIOGRAPHY;  Surgeon: Marcina Millard, MD;  Location: [facility]VAS[facility] CV [facility];  Service: Cardiovascular;  Laterality: N/A;    Prior to Admission medications   Medication Sig Start Date End Date Taking? Authorizing Provider  acetaminophen (TYLENOL) 500 MG tablet Take  500-1,000 mg by mouth 2 (two) times daily as needed for moderate pain or headache.    Yes [provider]  ALPRAZolam (XANAX XR) 1 MG 24 hr tablet Take 1 mg by mouth daily. 02/11/19  Yes [provider]  aspirin 81 MG tablet Take 81 mg by mouth daily.    Yes [provider]  clopidogrel (PLAVIX) 75 MG tablet Take 1 tablet (75 mg total) by mouth daily. 02/17/19  Yes Rolly Salter, MD  DULoxetine (CYMBALTA) 20 MG capsule Take 20 mg by mouth daily. 02/04/19  Yes [provider]  DULoxetine (CYMBALTA) 20 MG capsule Take 40 mg by mouth. 09/08/20  Yes [provider]  gabapentin (NEURONTIN) 300 MG capsule Take 1 capsule by mouth 3 (three) times daily. 08/03/20 08/03/21 Yes [provider]  isosorbide mononitrate (IMDUR) 60 MG 24 hr tablet Take 1 tablet (60 mg total) by mouth daily. Patient taking differently: Take 30 mg by mouth in the morning and at bedtime. 02/21/19  Yes Wieting, Richard, MD  montelukast (SINGULAIR) 10 MG tablet Take 10 mg by mouth daily.    Yes [provider]  rOPINIRole (REQUIP) 0.25 MG tablet 0.5 mg at bedtime. 05/20/19  Yes [provider]  albuterol (VENTOLIN HFA) 108 (90 Base) MCG/ACT inhaler Inhale into the lungs. 12/13/20 12/13/21  [provider]  baclofen (LIORESAL) 10 MG tablet  07/04/19   [provider]  cyclobenzaprine (FLEXERIL) 5 MG tablet  06/27/19   [provider]  nitroGLYCERIN (NITROSTAT) 0.4 MG SL tablet Place 1 tablet (0.4 mg total) under the tongue every 5 (five) minutes as needed for chest pain. 02/17/19   Rolly Salter, MD    Allergies as of 03/11/2021 - Review Complete 12/21/2020  Allergen Reaction Noted   Amoxicillin  08/31/2016   Atorvastatin  02/01/2018    Family History  Problem Relation Age of Onset   Hypertension Mother    Heart disease Father    CAD Father    Heart disease Sister    Stroke Sister    Breast cancer Neg Hx     Social History    Socioeconomic History   Marital status: Married    Spouse name: Not on file   Number of children: Not on file   Years of education: Not on file   Highest education level: Not on file  Occupational History   Not on file  Tobacco Use   Smoking status: Never   Smokeless tobacco: Never  Vaping Use   Vaping Use: Never used  Substance and Sexual Activity   Alcohol use: No   Drug use: No   Sexual activity: Not Currently  Other Topics Concern   Not on file  Social History Narrative   Not on file   Social Determinants of Health   Financial Resource Strain: Not on file  Food Insecurity: Not on file  Transportation Needs: Not on file  Physical Activity: Not on file  Stress: Not on file  Social Connections: Not on file  Intimate Partner Violence: Not on file    Review of Systems: See HPI, otherwise negative ROS  Physical Exam: BP 125/86    Pulse (!) 58    Temp 97.9 F (36.6 C) (Temporal)    Resp 18    Ht 5\' 2"  (1.575 m)    Wt 81.6 kg    SpO2 98%    BMI 32.92 kg/m  General:   Alert,  pleasant and cooperative in NAD Head:  Normocephalic and atraumatic. Lungs:  Clear to auscultation.    Heart:  Regular rate and rhythm.   Impression/Plan: Courtney Blanchard is here for ophthalmic surgery.  Risks, benefits, limitations, and alternatives regarding ophthalmic surgery have been reviewed with the patient.  Questions have been answered.  All parties agreeable.   , MD  04/13/2021, 7:39 AM

## 2021-04-13 NOTE — Anesthesia Postprocedure Evaluation (Signed)
Anesthesia Post Note  Patient: Courtney Blanchard  Procedure(s) Performed: CATARACT EXTRACTION PHACO AND INTRAOCULAR LENS PLACEMENT (IOC) RIGHT DIABETIC (Right: Eye)     Patient location during evaluation: PACU Anesthesia Type: MAC Level of consciousness: awake and alert Pain management: pain level controlled Vital Signs Assessment: post-procedure vital signs reviewed and stable Respiratory status: spontaneous breathing, nonlabored ventilation, respiratory function stable and patient connected to nasal cannula oxygen Cardiovascular status: stable and blood pressure returned to baseline Postop Assessment: no apparent nausea or vomiting Anesthetic complications: no   No notable events documented.  Sinda Du

## 2021-04-13 NOTE — Progress Notes (Signed)
Dr. Jena Gauss in to see patient at 09:15, aware of all vital signs.  Patient remains alert and oriented, asymptomatic with SBP 80s.  OK to discharge home per Dr. Jena Gauss.  Patient discharged at 09:20.

## 2021-04-13 NOTE — Anesthesia Preprocedure Evaluation (Addendum)
Anesthesia Evaluation  Patient identified by MRN, date of birth, ID band Patient awake    History of Anesthesia Complications (+) PONV and history of anesthetic complications  Airway Mallampati: III  TM Distance: >3 FB Neck ROM: Full    Dental  (+)    Pulmonary shortness of breath, asthma , COPD,  COPD inhaler,    Pulmonary exam normal breath sounds clear to auscultation       Cardiovascular Exercise Tolerance: Good hypertension, + CAD, + Past MI and + Cardiac Stents  Normal cardiovascular exam+ dysrhythmias  Rhythm:Regular Rate:Normal  The patient presented to [facility] ED 02/13/2019 with anterior ST elevation myocardial infarction. Cardiac catheterization revealed patent stents RCA with evidence for spontaneous coronary artery dissection mid LAD, treated conservatively with dual antiplatelet therapy. 2D echocardiogram 02/13/2019 revealed mild just left ventricular function, with LVEF 40 to 45%, with apical akinesis. Patient was readmitted 02/19/2019 with recurrent chest pain without new ECG changes, with downtrending high-sensitivity troponin. The patient again was treated conservatively with medical therapy.     Neuro/Psych  Headaches, PSYCHIATRIC DISORDERS Anxiety Depression    GI/Hepatic negative GI ROS, Neg liver ROS,   Endo/Other  diabetes, Well Controlled, Type 2  Renal/GU negative Renal ROS     Musculoskeletal   Abdominal (+) + obese,  Abdomen: soft.    Peds  Hematology negative hematology ROS (+)   Anesthesia Other Findings   Reproductive/Obstetrics                             Anesthesia Physical  Anesthesia Plan  ASA: 3  Anesthesia Plan: MAC   Post-op Pain Management: Minimal or no pain anticipated   Induction: Intravenous  PONV Risk Score and Plan: 3 and TIVA, Midazolam, Treatment may vary due to age or medical condition and Ondansetron  Airway Management Planned: Nasal Cannula  and Natural Airway  Additional Equipment: None  Intra-op Plan:   Post-operative Plan:   Informed Consent: I have reviewed the patients History and Physical, chart, labs and discussed the procedure including the risks, benefits and alternatives for the proposed anesthesia with the patient or authorized representative who has indicated his/her understanding and acceptance.       Plan Discussed with: CRNA  Anesthesia Plan Comments: (Patient had PONV after last cataract and was more awake than she desired with 2 mg midazolam, 100 mcg fentanyl, and 10 mcg dexmedetomidine.)       Anesthesia Quick Evaluation  Patient Active Problem List   Diagnosis Date Noted   Allergy 09/12/2019   Asthma without status asthmaticus 09/12/2019   Borderline blood pressure 09/12/2019   Diabetes mellitus ([place]) 09/12/2019   Endometriosis 09/12/2019   Migraines 09/12/2019   S/P coronary artery stent placement 09/12/2019   Varicose veins of leg with pain, bilateral 09/12/2019   Varicose veins of leg with pain, right 08/12/2019   Elevated troponin    Spontaneous dissection of coronary artery    Fatigue    Neuropathy    Acute ST elevation myocardial infarction (STEMI) involving left anterior descending (LAD) coronary artery ([place]) 02/13/2019   Hypertension    COPD (chronic obstructive pulmonary disease) ([place])    Coronary artery disease    Depression    Anxiety    HLD (hyperlipidemia)    Heart palpitations 05/13/2018   NSTEMI (non-ST elevated myocardial infarction) ([place]) 01/27/2018   Chest pain 08/31/2016    CBC Latest Ref Rng & Units 08/31/2020 02/21/2019 02/20/2019  WBC 4.0 -  10.5 K/uL 4.9 5.7 5.9  Hemoglobin 12.0 - 15.0 g/dL 11.8(L) 12.3 11.8(L)  Hematocrit 36.0 - 46.0 % 38.2 37.5 36.4  Platelets 150 - 400 K/uL 283 242 235   BMP Latest Ref Rng & Units 08/31/2020 02/20/2019 02/19/2019  Glucose 70 - 99 mg/dL 84 98 106(H)  BUN 8 - 23 mg/dL _0 Creatinine 0.44 - 1.00  mg/dL 0.72 0.59 0.65  Sodium 135 - 145 mmol/L 138 138 137  Potassium 3.5 - 5.1 mmol/L 4.2 4.3 3.9  Chloride 98 - 111 mmol/L 107 103 102  CO2 22 - 32 mmol/L _1 Calcium 8.9 - 10.3 mg/dL 8.6(L) 8.8(L) 8.7(L)    Risks and benefits of anesthesia discussed at length, patient or surrogate demonstrates understanding. Appropriately NPO. Plan to proceed with anesthesia.  Champ Mungo, MD 04/13/21

## 2021-04-13 NOTE — Anesthesia Procedure Notes (Signed)
Procedure Name: MAC Date/Time: 04/13/2021 8:16 AM Performed by: Jeannene Patella, CRNA Pre-anesthesia Checklist: Patient identified, Emergency Drugs available, Suction available, Timeout performed and Patient being monitored Patient Re-evaluated:Patient Re-evaluated prior to induction Oxygen Delivery Method: Nasal cannula Placement Confirmation: positive ETCO2

## 2021-04-14 ENCOUNTER — Encounter: Payer: Self-pay | Admitting: Ophthalmology

## 2021-05-09 DIAGNOSIS — B001 Herpesviral vesicular dermatitis: Secondary | ICD-10-CM | POA: Diagnosis not present

## 2021-05-09 DIAGNOSIS — L988 Other specified disorders of the skin and subcutaneous tissue: Secondary | ICD-10-CM | POA: Diagnosis not present

## 2021-06-01 DIAGNOSIS — Z789 Other specified health status: Secondary | ICD-10-CM | POA: Diagnosis not present

## 2021-06-01 DIAGNOSIS — I10 Essential (primary) hypertension: Secondary | ICD-10-CM | POA: Diagnosis not present

## 2021-06-01 DIAGNOSIS — E118 Type 2 diabetes mellitus with unspecified complications: Secondary | ICD-10-CM | POA: Diagnosis not present

## 2021-06-01 DIAGNOSIS — Z1331 Encounter for screening for depression: Secondary | ICD-10-CM | POA: Diagnosis not present

## 2021-06-01 DIAGNOSIS — E538 Deficiency of other specified B group vitamins: Secondary | ICD-10-CM | POA: Diagnosis not present

## 2021-06-01 DIAGNOSIS — J449 Chronic obstructive pulmonary disease, unspecified: Secondary | ICD-10-CM | POA: Diagnosis not present

## 2021-06-01 DIAGNOSIS — F3342 Major depressive disorder, recurrent, in full remission: Secondary | ICD-10-CM | POA: Diagnosis not present

## 2021-06-01 DIAGNOSIS — E785 Hyperlipidemia, unspecified: Secondary | ICD-10-CM | POA: Diagnosis not present

## 2021-06-01 DIAGNOSIS — I25118 Atherosclerotic heart disease of native coronary artery with other forms of angina pectoris: Secondary | ICD-10-CM | POA: Diagnosis not present

## 2021-07-22 DIAGNOSIS — E538 Deficiency of other specified B group vitamins: Secondary | ICD-10-CM | POA: Diagnosis not present

## 2021-10-19 DIAGNOSIS — R5381 Other malaise: Secondary | ICD-10-CM | POA: Diagnosis not present

## 2021-10-19 DIAGNOSIS — R5383 Other fatigue: Secondary | ICD-10-CM | POA: Diagnosis not present

## 2021-10-19 DIAGNOSIS — E119 Type 2 diabetes mellitus without complications: Secondary | ICD-10-CM | POA: Diagnosis not present

## 2021-10-19 DIAGNOSIS — E538 Deficiency of other specified B group vitamins: Secondary | ICD-10-CM | POA: Diagnosis not present

## 2021-10-19 DIAGNOSIS — I10 Essential (primary) hypertension: Secondary | ICD-10-CM | POA: Diagnosis not present

## 2022-01-09 ENCOUNTER — Encounter (INDEPENDENT_AMBULATORY_CARE_PROVIDER_SITE_OTHER): Payer: Self-pay

## 2022-01-17 ENCOUNTER — Other Ambulatory Visit: Payer: Self-pay

## 2022-01-17 ENCOUNTER — Encounter: Payer: Self-pay | Admitting: Emergency Medicine

## 2022-01-17 ENCOUNTER — Emergency Department: Payer: Medicare HMO

## 2022-01-17 ENCOUNTER — Emergency Department
Admission: EM | Admit: 2022-01-17 | Discharge: 2022-01-17 | Disposition: A | Discharge status: Home / Self Care | Payer: Medicare HMO | Attending: Emergency Medicine | Admitting: Emergency Medicine

## 2022-01-17 DIAGNOSIS — J449 Chronic obstructive pulmonary disease, unspecified: Secondary | ICD-10-CM | POA: Insufficient documentation

## 2022-01-17 DIAGNOSIS — J45909 Unspecified asthma, uncomplicated: Secondary | ICD-10-CM | POA: Diagnosis not present

## 2022-01-17 DIAGNOSIS — R0602 Shortness of breath: Secondary | ICD-10-CM | POA: Diagnosis not present

## 2022-01-17 DIAGNOSIS — R0789 Other chest pain: Secondary | ICD-10-CM | POA: Diagnosis not present

## 2022-01-17 DIAGNOSIS — I2510 Atherosclerotic heart disease of native coronary artery without angina pectoris: Secondary | ICD-10-CM | POA: Diagnosis not present

## 2022-01-17 DIAGNOSIS — E1140 Type 2 diabetes mellitus with diabetic neuropathy, unspecified: Secondary | ICD-10-CM | POA: Diagnosis not present

## 2022-01-17 DIAGNOSIS — R079 Chest pain, unspecified: Secondary | ICD-10-CM

## 2022-01-17 DIAGNOSIS — I10 Essential (primary) hypertension: Secondary | ICD-10-CM | POA: Insufficient documentation

## 2022-01-17 LAB — BASIC METABOLIC PANEL
Anion gap: 8 (ref 5–15)
BUN: 14 mg/dL (ref 8–23)
CO2: 24 mmol/L (ref 22–32)
Calcium: 9 mg/dL (ref 8.9–10.3)
Chloride: 106 mmol/L (ref 98–111)
Creatinine, Ser: 0.75 mg/dL (ref 0.44–1.00)
GFR, Estimated: >60 mL/min (ref >60)
Glucose, Bld: 178 mg/dL — ABNORMAL HIGH (ref 70–99)
Potassium: 3.9 mmol/L (ref 3.5–5.1)
Sodium: 138 mmol/L (ref 135–145)

## 2022-01-17 LAB — CBC
HCT: 36.8 % (ref 36.0–46.0)
Hemoglobin: 11.5 g/dL — ABNORMAL LOW (ref 12.0–15.0)
MCH: 27.3 pg (ref 26.0–34.0)
MCHC: 31.3 g/dL (ref 30.0–36.0)
MCV: 87.4 fL (ref 80.0–100.0)
Platelets: 256 10*3/uL (ref 150–400)
RBC: 4.21 MIL/uL (ref 3.87–5.11)
RDW: 13.8 % (ref 11.5–15.5)
WBC: 5.2 10*3/uL (ref 4.0–10.5)
nRBC: 0 % (ref 0.0–0.2)

## 2022-01-17 LAB — BRAIN NATRIURETIC PEPTIDE: B Natriuretic Peptide: 139.9 pg/mL — ABNORMAL HIGH (ref 0.0–100.0)

## 2022-01-17 LAB — TROPONIN I (HIGH SENSITIVITY)
Troponin I (High Sensitivity): 6 ng/L (ref <18)
Troponin I (High Sensitivity): 7 ng/L (ref <18)

## 2022-01-17 NOTE — ED Provider Triage Note (Signed)
  Emergency Medicine Provider Triage Evaluation Note  Courtney Blanchard , a 66 y.o.female,  was evaluated in triage.  Pt complains of chest pain/shortness of breath.  Patient states that she has had chest pain over the past week has been occurring intermittently at random.  Not associated with exertion.  She does have a history of MI and aortic dissection.  She has taken 2 nitroglycerin at home, but has had minimal relief.  In addition endorses radiation of pain into her neck.   Review of Systems  Positive: Chest pain, shortness of breath Negative: Denies fever, abdominal pain, vomiting  Physical Exam   Vitals:   01/17/22 1504  BP: (!) 121/57  Pulse: 82  Resp: 18  SpO2: 99%   Gen:   Awake, no distress   Resp:  Normal effort  MSK:   Moves extremities without difficulty  Other:    Medical Decision Making  Given the patient's initial medical screening exam, the following diagnostic evaluation has been ordered. The patient will be placed in the appropriate treatment space, once one is available, to complete the evaluation and treatment. I have discussed the plan of care with the patient and I have advised the patient that an ED physician or mid-level practitioner will reevaluate their condition after the test results have been received, as the results may give them additional insight into the type of treatment they may need.    Diagnostics: Labs, EKG, CXR  Treatments: none immediately   Teodoro Spray, [place] 01/17/22 1611

## 2022-01-17 NOTE — ED Triage Notes (Addendum)
Pt to ER with c/o chest pain that radiates into bilateral neck, chest tightness and shortness of breath. Pt with hx of MI and Aortic dissection.  Pt states dissection was December 2021.  Pt states she has taken 2 NTG at home and that the 2nd may have given her some relief.  Pt states symptoms for last week.

## 2022-01-17 NOTE — ED Provider Notes (Signed)
[facility] Provider Note    Event Date/Time   First MD Initiated Contact with Patient 01/17/22 1936     (approximate)   History   Chest Pain and Shortness of Breath   HPI  Courtney Blanchard is a 66 y.o. female with past medical history of STEMI due to SCAD who presents with chest pain.  Patient notes that for the last several days she has had chest pain and shortness of breath on and off.  She describes dyspnea and fatigue primarily with exertion.  Occasionally she will feel chest tightness and pressure.  Does sometimes come on when she is exerting herself but also comes on randomly.  Last for about a minute at a time and then goes away.  No associated nausea or diaphoresis.  Patient had a little bit of cough over the last several days but no fevers or chills.  No lower extremity swelling or pain.  Today in the afternoon patient took 2 nitroglycerin because of the pain the first 1 did not have any effect the second 1 relieved the pain.  Currently she has mild anterior chest pressure but not significant pain     Past Medical History:  Diagnosis Date   Anxiety    Aortic dissection (HCC) 02/13/2019   LAD   Asthma    Bronchitis    Chronic cough    Complication of anesthesia    nausea and vomiting   COPD (chronic obstructive pulmonary disease) (HCC)    Coronary artery disease    Depression    Diabetes mellitus without complication (HCC)    Diet controlled   Diverticulosis    Dyspnea    Dysrhythmia    Environmental and seasonal allergies    Hypertension    Lower extremity edema    Migraine headache    2-4X/month   STEMI (ST elevation myocardial infarction) (HCC) 02/13/2019   Tremors of nervous system    Wears dentures    Partial upper   Wheezing     Patient Active Problem List   Diagnosis Date Noted   Allergy 09/12/2019   Asthma without status asthmaticus 09/12/2019   Borderline blood pressure 09/12/2019   Diabetes mellitus (HCC)  09/12/2019   Endometriosis 09/12/2019   Migraines 09/12/2019   S/P coronary artery stent placement 09/12/2019   Varicose veins of leg with pain, bilateral 09/12/2019   Varicose veins of leg with pain, right 08/12/2019   Elevated troponin    Spontaneous dissection of coronary artery    Fatigue    Neuropathy    Acute ST elevation myocardial infarction (STEMI) involving left anterior descending (LAD) coronary artery (HCC) 02/13/2019   Hypertension    COPD (chronic obstructive pulmonary disease) (HCC)    Coronary artery disease    Depression    Anxiety    HLD (hyperlipidemia)    Heart palpitations 05/13/2018   NSTEMI (non-ST elevated myocardial infarction) (HCC) 01/27/2018   Chest pain 08/31/2016     Physical Exam  Triage Vital Signs: ED Triage Vitals  Enc Vitals Group     BP 01/17/22 1504 (!) 121/57     Pulse Rate 01/17/22 1504 82     Resp 01/17/22 1504 18     Temp 01/17/22 1850 98.2 F (36.8 C)     Temp Source 01/17/22 1850 Oral     SpO2 01/17/22 1504 99 %     Weight 01/17/22 1503 179 lb (81.2 kg)     Height 01/17/22 1503 5\' 2"  (1.575 m)  Head Circumference --      Peak Flow --      Pain Score 01/17/22 1502 8     Pain Loc --      Pain Edu? --      Excl. in GC? --     Most recent vital signs: Vitals:   01/17/22 1941 01/17/22 1942  BP:    Pulse: 67 67  Resp: 16 12  Temp:    SpO2: 100% 100%     General: Awake, no distress.  CV:  Good peripheral perfusion.  No lower extremity swelling Resp:  Normal effort.  Patient's lungs are clear Abd:  No distention.  Neuro:             Awake, Alert, Oriented x 3  Other:     ED Results / Procedures / Treatments  Labs (all labs ordered are listed, but only abnormal results are displayed) Labs Reviewed  BASIC METABOLIC PANEL - Abnormal; Notable for the following components:      Result Value   Glucose, Bld 178 (*)    All other components within normal limits  CBC - Abnormal; Notable for the following components:    Hemoglobin 11.5 (*)    All other components within normal limits  BRAIN NATRIURETIC PEPTIDE - Abnormal; Notable for the following components:   B Natriuretic Peptide 139.9 (*)    All other components within normal limits  TROPONIN I (HIGH SENSITIVITY)  TROPONIN I (HIGH SENSITIVITY)     EKG  EKG interpretation performed by myself: NSR, nml axis, nml intervals, no acute ischemic changes    RADIOLOGY I reviewed and interpreted the CXR which does not show any acute cardiopulmonary process    PROCEDURES:  Critical Care performed: No  .1-3 Lead EKG Interpretation  Performed by: Georga Hacking, MD Authorized by: Georga Hacking, MD     Interpretation: normal     ECG rate assessment: normal     Rhythm: sinus rhythm     Ectopy: none     Conduction: normal     The patient is on the cardiac monitor to evaluate for evidence of arrhythmia and/or significant heart rate changes.   MEDICATIONS ORDERED IN ED: Medications - No data to display   IMPRESSION / MDM / ASSESSMENT AND PLAN / ED COURSE  I reviewed the triage vital signs and the nursing notes.                              Patient's presentation is most consistent with acute presentation with potential threat to life or bodily function.  Differential diagnosis includes, but is not limited to, acute coronary syndrome, aortic dissection, CHF, pulmonary embolism, pneumonia, viral syndrome, myocarditis/pericarditis  Patient is a 66 year old female with history of scad causing STEMI to the LAD who presents because of chest pain.  Symptoms have been going on for the last several days intermittently.  She feels short of breath with exertion.  Her chest pain is described as a pressure type feeling that does not radiate.  It is sometimes brought on at rest sometimes with exertion there is no clear exacerbating or alleviating factors.  Is not associate with nausea vomiting or diaphoresis.  Patient says this does not feel similar  to when she had her heart attack.  Mildly hypertensive vitals otherwise reassuring she satting 100% on room air.  She looks comfortable.  Patient's EKG is nonischemic.  She has 2 high-sensitivity troponins  that are negative.  Patient has a follow-up appoint with Dr. Saralyn Pilar in 2 days.  Given her negative work-up here in fact that she has had similar symptoms in the past I think that because she has such close follow-up she can safely be discharged.  We discussed the fact that despite the fact she is not having in a heart attack today that we cannot be 100% sure that this is not her heart so she may need additional work-up such as an echo or stress test.  We discussed return precautions for new or worsening pain that is not going away.  Stressed the importance of keeping her cardiology appointment.       FINAL CLINICAL IMPRESSION(S) / ED DIAGNOSES   Final diagnoses:  Chest pain, unspecified type     Rx / DC Orders   ED Discharge Orders     None        Note:  This document was prepared using Dragon voice recognition software and may include unintentional dictation errors.   Rada Hay, MD 01/17/22 2011

## 2022-01-17 NOTE — Discharge Instructions (Addendum)
Your work-up today including your EKG cardiac enzymes and x-ray were all reassuring.  It is very important that you keep your appointment with your cardiologist on Thursday so that you can discuss your symptoms.  If you have any new chest pain or chest pain that is worsening or not going away after several minutes please return to the emergency department.

## 2022-01-19 DIAGNOSIS — R5383 Other fatigue: Secondary | ICD-10-CM | POA: Diagnosis not present

## 2022-01-19 DIAGNOSIS — R053 Chronic cough: Secondary | ICD-10-CM | POA: Diagnosis not present

## 2022-01-19 DIAGNOSIS — I10 Essential (primary) hypertension: Secondary | ICD-10-CM | POA: Diagnosis not present

## 2022-01-19 DIAGNOSIS — Z23 Encounter for immunization: Secondary | ICD-10-CM | POA: Diagnosis not present

## 2022-01-19 DIAGNOSIS — I25118 Atherosclerotic heart disease of native coronary artery with other forms of angina pectoris: Secondary | ICD-10-CM | POA: Diagnosis not present

## 2022-01-19 DIAGNOSIS — J449 Chronic obstructive pulmonary disease, unspecified: Secondary | ICD-10-CM | POA: Diagnosis not present

## 2022-01-20 DIAGNOSIS — E538 Deficiency of other specified B group vitamins: Secondary | ICD-10-CM | POA: Diagnosis not present

## 2022-01-20 DIAGNOSIS — Z23 Encounter for immunization: Secondary | ICD-10-CM | POA: Diagnosis not present

## 2022-02-21 DIAGNOSIS — R0789 Other chest pain: Secondary | ICD-10-CM | POA: Diagnosis not present

## 2022-02-21 DIAGNOSIS — J45909 Unspecified asthma, uncomplicated: Secondary | ICD-10-CM | POA: Diagnosis not present

## 2022-02-23 DIAGNOSIS — R0989 Other specified symptoms and signs involving the circulatory and respiratory systems: Secondary | ICD-10-CM | POA: Diagnosis not present

## 2022-02-23 DIAGNOSIS — R059 Cough, unspecified: Secondary | ICD-10-CM | POA: Diagnosis not present

## 2022-02-23 DIAGNOSIS — Z03818 Encounter for observation for suspected exposure to other biological agents ruled out: Secondary | ICD-10-CM | POA: Diagnosis not present

## 2022-03-02 DIAGNOSIS — J4521 Mild intermittent asthma with (acute) exacerbation: Secondary | ICD-10-CM | POA: Diagnosis not present

## 2022-03-02 DIAGNOSIS — Z7901 Long term (current) use of anticoagulants: Secondary | ICD-10-CM | POA: Diagnosis not present

## 2022-03-02 DIAGNOSIS — J069 Acute upper respiratory infection, unspecified: Secondary | ICD-10-CM | POA: Diagnosis not present

## 2022-04-07 ENCOUNTER — Other Ambulatory Visit (INDEPENDENT_AMBULATORY_CARE_PROVIDER_SITE_OTHER): Payer: Self-pay | Admitting: Nurse Practitioner

## 2022-04-07 DIAGNOSIS — M79604 Pain in right leg: Secondary | ICD-10-CM

## 2022-04-07 DIAGNOSIS — R200 Anesthesia of skin: Secondary | ICD-10-CM

## 2022-04-10 ENCOUNTER — Ambulatory Visit (INDEPENDENT_AMBULATORY_CARE_PROVIDER_SITE_OTHER): Payer: Medicare HMO

## 2022-04-10 ENCOUNTER — Encounter (INDEPENDENT_AMBULATORY_CARE_PROVIDER_SITE_OTHER): Payer: Self-pay | Admitting: Nurse Practitioner

## 2022-04-10 ENCOUNTER — Ambulatory Visit (INDEPENDENT_AMBULATORY_CARE_PROVIDER_SITE_OTHER): Payer: Medicare HMO | Admitting: Nurse Practitioner

## 2022-04-10 VITALS — BP 112/77 | HR 60 | Resp 16 | Wt 186.6 lb

## 2022-04-10 DIAGNOSIS — M79605 Pain in left leg: Secondary | ICD-10-CM

## 2022-04-10 DIAGNOSIS — R200 Anesthesia of skin: Secondary | ICD-10-CM | POA: Diagnosis not present

## 2022-04-10 DIAGNOSIS — E785 Hyperlipidemia, unspecified: Secondary | ICD-10-CM | POA: Diagnosis not present

## 2022-04-10 DIAGNOSIS — M79604 Pain in right leg: Secondary | ICD-10-CM | POA: Diagnosis not present

## 2022-04-10 DIAGNOSIS — I10 Essential (primary) hypertension: Secondary | ICD-10-CM | POA: Diagnosis not present

## 2022-04-10 DIAGNOSIS — R202 Paresthesia of skin: Secondary | ICD-10-CM

## 2022-04-10 DIAGNOSIS — I83813 Varicose veins of bilateral lower extremities with pain: Secondary | ICD-10-CM

## 2022-04-10 NOTE — Progress Notes (Signed)
Subjective:    Patient ID: Courtney Blanchard, female    DOB: 29-Aug-1955, 67 y.o.   MRN: [id number] Chief Complaint  Patient presents with   Follow-up    Ultrasound follow up      Courtney Blanchard is a 67 year old female who returns to the office today due to her issues with varicose veins.  She has previously had endovenous laser ablation of her bilateral great saphenous veins.  However she continues to have bilateral anterior accessory saphenous veins with evidence of reflux or not amenable to endovenous laser ablation.  She has also previously had sclerotherapy which has shown improvement in her symptoms however she notes that her pain and symptoms have returned, worse than they have been before.  The patient note significant improvement in the lower extremity pain but not resolution of the symptoms. The patient notes multiple residual varicosities bilaterally which continued to hurt with dependent positions and remained tender to palpation. The patient's swelling is minimally from preoperative status. The patient continues to wear graduated compression stockings on a daily basis but these are not eliminating the pain and discomfort. The patient continues to use over-the-counter anti-inflammatory medications to treat the pain and related symptoms but this has not given the patient relief. The patient notes the pain in the lower extremities is causing problems with daily exercise, problems at work and even with household activities such as preparing meals and doing dishes.  The patient is otherwise done well and there have been no complications related to the laser procedure or interval changes in the patient's overall   Today's study showed no evidence of DVT or superficial thrombophlebitis bilaterally.  There is evidence of deep venous insufficiency in the left lower extremity.  There is also reflux at the saphenofemoral junction and the great saphenous vein bilaterally with evidence of prior  ablation in the great saphenous vein.  The patient also has accessory saphenous veins bilaterally with evidence of reflux     Review of Systems  Cardiovascular:  Positive for leg swelling.  All other systems reviewed and are negative.      Objective:   Physical Exam Vitals reviewed.  HENT:     Head: Normocephalic.  Cardiovascular:     Rate and Rhythm: Normal rate.  Pulmonary:     Effort: Pulmonary effort is normal.  Musculoskeletal:        General: Tenderness present.     Left lower leg: Edema present.  Skin:    General: Skin is warm and dry.     Comments: Large bulging varicose veins bilaterally with the left being worse than the right  Neurological:     Mental Status: She is alert.  Psychiatric:        Judgment: Judgment normal.     BP 112/77 (BP Location: Left Arm)   Pulse 60   Resp 16   Wt 186 lb 9.6 oz (84.6 kg)   BMI 34.13 kg/m   Past Medical History:  Diagnosis Date   Anxiety    Aortic dissection ([place]) 02/13/2019   LAD   Asthma    Bronchitis    Chronic cough    Complication of anesthesia    nausea and vomiting   COPD (chronic obstructive pulmonary disease) (HCC)    Coronary artery disease    Depression    Diabetes mellitus without complication (HCC)    Diet controlled   Diverticulosis    Dyspnea    Dysrhythmia    Environmental and seasonal allergies  Hypertension    Lower extremity edema    Migraine headache    2-4X/month   STEMI (ST elevation myocardial infarction) ([place]) 02/13/2019   Tremors of nervous system    Wears dentures    Partial upper   Wheezing     Social History   Socioeconomic History   Marital status: Married    Spouse name: Not on file   Number of children: Not on file   Years of education: Not on file   Highest education level: Not on file  Occupational History   Not on file  Tobacco Use   Smoking status: Never   Smokeless tobacco: Never  Vaping Use   Vaping Use: Never used  Substance and Sexual Activity    Alcohol use: No   Drug use: No   Sexual activity: Not Currently  Other Topics Concern   Not on file  Social History Narrative   Not on file   Social Determinants of Health   Financial Resource Strain: Low Risk  (02/19/2019)   Overall Financial Resource Strain (CARDIA)    Difficulty of Paying Living Expenses: Not hard at all  Food Insecurity: No Food Insecurity (02/19/2019)   Hunger Vital Sign    Worried About Running Out of Food in the Last Year: Never true    Ran Out of Food in the Last Year: Never true  Transportation Needs: No Transportation Needs (02/19/2019)   PRAPARE - Hydrologist (Medical): No    Lack of Transportation (Non-Medical): No  Physical Activity: Inactive (02/19/2019)   Exercise Vital Sign    Days of Exercise per Week: 0 days    Minutes of Exercise per Session: 0 min  Stress: Stress Concern Present (02/19/2019)   [place]    Feeling of Stress : To some extent  Social Connections: Unknown (02/19/2019)   Social Connection and Isolation Panel [NHANES]    Frequency of Communication with Friends and Family: Once a week    Frequency of Social Gatherings with Friends and Family: Once a week    Attends Religious Services: Not on Advertising copywriter or Organizations: Not on file    Attends Archivist Meetings: Not on file    Marital Status: Not on file  Intimate Partner Violence: Not At Risk (02/19/2019)   Humiliation, Afraid, Rape, and Kick questionnaire    Fear of Current or Ex-Partner: No    Emotionally Abused: No    Physically Abused: No    Sexually Abused: No    Past Surgical History:  Procedure Laterality Date   ABDOMINAL HYSTERECTOMY     BARIATRIC [place]     with stent   CATARACT EXTRACTION W/PHACO Left 03/30/2021   Procedure: CATARACT EXTRACTION PHACO AND INTRAOCULAR LENS PLACEMENT ([place]) LEFT DIABETIC;  Surgeon:  Leandrew Koyanagi, MD;  Location: [facility];  Service: Ophthalmology;  Laterality: Left;  3.39 0:31.6   CATARACT EXTRACTION W/PHACO Right 04/13/2021   Procedure: CATARACT EXTRACTION PHACO AND INTRAOCULAR LENS PLACEMENT ([place]) RIGHT DIABETIC;  Surgeon: Leandrew Koyanagi, MD;  Location: [facility];  Service: Ophthalmology;  Laterality: Right;  3.31 00:42.0   CHOLECYSTECTOMY     COLON SURGERY     LEFT HEART CATH AND CORONARY ANGIOGRAPHY N/A 01/28/2018   Procedure: LEFT HEART CATH AND CORONARY ANGIOGRAPHY;  Surgeon: Teodoro Spray, MD;  Location: [facility];  Service: Cardiovascular;  Laterality: N/A;   LEFT HEART CATH AND CORONARY ANGIOGRAPHY N/A 02/13/2019   Procedure: LEFT HEART CATH AND CORONARY ANGIOGRAPHY;  Surgeon: Marcina Millard, MD;  Location: [facility];  Service: Cardiovascular;  Laterality: N/A;    Family History  Problem Relation Age of Onset   Hypertension Mother    Heart disease Father    CAD Father    Heart disease Sister    Stroke Sister    Breast cancer Neg Hx     Allergies  Allergen Reactions   Amoxicillin     Has patient had a PCN reaction causing immediate rash, facial/tongue/throat swelling, SOB or lightheadedness with hypotension: Yes Has patient had a PCN reaction causing severe rash involving mucus membranes or skin necrosis: No Has patient had a PCN reaction that required hospitalization: No Has patient had a PCN reaction occurring within the last 10 years: No If all of the above answers are "NO", then may proceed with Cephalosporin use.  Sore throat    Atorvastatin     Tremors        Latest Ref Rng & Units 01/17/2022    3:09 PM 08/31/2020    5:48 PM 02/21/2019    4:56 AM  CBC  WBC 4.0 - 10.5 K/uL 5.2  4.9  5.7   Hemoglobin 12.0 - 15.0 g/dL 33.0  07.6  22.6   Hematocrit 36.0 - 46.0 % 36.8  38.2  37.5   Platelets 150 - 400 K/uL 256  283  242       CMP     Component Value Date/Time   NA 138  01/17/2022 1509   NA 142 02/21/2014 2229   K 3.9 01/17/2022 1509   K 4.4 02/21/2014 2229   CL 106 01/17/2022 1509   CL 108 (H) 02/21/2014 2229   CO2 24 01/17/2022 1509   CO2 29 02/21/2014 2229   GLUCOSE 178 (H) 01/17/2022 1509   GLUCOSE 110 (H) 02/21/2014 2229   BUN 14 01/17/2022 1509   BUN 15 02/21/2014 2229   CREATININE 0.75 01/17/2022 1509   CREATININE 0.70 02/21/2014 2229   CALCIUM 9.0 01/17/2022 1509   CALCIUM 8.5 02/21/2014 2229   PROT 6.5 02/20/2019 0253   ALBUMIN 3.5 02/20/2019 0253   AST 47 (H) 02/20/2019 0253   ALT 23 02/20/2019 0253   ALKPHOS 92 02/20/2019 0253   BILITOT 0.7 02/20/2019 0253   GFRNONAA >60 01/17/2022 1509   GFRNONAA >60 02/21/2014 2229   GFRAA >60 02/20/2019 0253   GFRAA >60 02/21/2014 2229     No results found.     Assessment & Plan:   1. Varicose veins of leg with pain, bilateral Recommend:  The patient has had successful ablation of the previously incompetent saphenous venous system but still has persistent symptoms of pain and swelling that are having a negative impact on daily life and daily activities.CEAP [id number].  Patient should undergo injection sclerotherapy to treat the residual varicosities.  The risks, benefits and alternative therapies were reviewed in detail with the patient.  All questions were answered.  The patient agrees to proceed with sclerotherapy at their convenience.  The patient will continue wearing the graduated compression stockings and using the over-the-counter pain medications to treat her symptoms.     2. Primary hypertension Continue antihypertensive medications as already ordered, these medications have been reviewed and there are no changes at this time.  3. Hyperlipidemia, unspecified hyperlipidemia type Continue statin as ordered and reviewed, no changes at this time  Current Outpatient Medications on File Prior to Visit  Medication Sig Dispense Refill   acetaminophen (TYLENOL) 500 MG tablet Take  500-1,000 mg by mouth 2 (two) times daily as needed for moderate pain or headache.      ALPRAZolam (XANAX XR) 1 MG 24 hr tablet Take 1 mg by mouth daily.     aspirin 81 MG tablet Take 81 mg by mouth daily.      clopidogrel (PLAVIX) 75 MG tablet Take 1 tablet (75 mg total) by mouth daily. 30 tablet 0   DULoxetine (CYMBALTA) 20 MG capsule Take 20 mg by mouth daily.     DULoxetine (CYMBALTA) 20 MG capsule Take 40 mg by mouth.     gabapentin (NEURONTIN) 300 MG capsule Take 1 capsule by mouth 3 (three) times daily.     isosorbide mononitrate (IMDUR) 60 MG 24 hr tablet Take 1 tablet (60 mg total) by mouth daily. (Patient taking differently: Take 30 mg by mouth in the morning and at bedtime.) 30 tablet 0   montelukast (SINGULAIR) 10 MG tablet Take 10 mg by mouth daily.      nitroGLYCERIN (NITROSTAT) 0.4 MG SL tablet Place 1 tablet (0.4 mg total) under the tongue every 5 (five) minutes as needed for chest pain. 30 tablet 0   rOPINIRole (REQUIP) 0.25 MG tablet 0.5 mg at bedtime.     baclofen (LIORESAL) 10 MG tablet  (Patient not taking: Reported on 03/22/2021)     cyclobenzaprine (FLEXERIL) 5 MG tablet  (Patient not taking: Reported on 03/22/2021)     No current facility-administered medications on file prior to visit.    There are no Patient Instructions on file for this visit. No follow-ups on file.   Kris Hartmann, NP

## 2022-04-25 ENCOUNTER — Other Ambulatory Visit (INDEPENDENT_AMBULATORY_CARE_PROVIDER_SITE_OTHER): Payer: Self-pay | Admitting: Nurse Practitioner

## 2022-04-25 DIAGNOSIS — M79606 Pain in leg, unspecified: Secondary | ICD-10-CM

## 2022-04-25 DIAGNOSIS — I83813 Varicose veins of bilateral lower extremities with pain: Secondary | ICD-10-CM

## 2022-04-26 ENCOUNTER — Ambulatory Visit (INDEPENDENT_AMBULATORY_CARE_PROVIDER_SITE_OTHER): Payer: Medicare HMO

## 2022-04-26 DIAGNOSIS — I83813 Varicose veins of bilateral lower extremities with pain: Secondary | ICD-10-CM | POA: Diagnosis not present

## 2022-05-10 ENCOUNTER — Telehealth (INDEPENDENT_AMBULATORY_CARE_PROVIDER_SITE_OTHER): Payer: Self-pay | Admitting: Vascular Surgery

## 2022-05-10 NOTE — Telephone Encounter (Signed)
LVM for pt advising that I will attempt to call her again tomorrow to set up the sclerotherapy.

## 2022-06-13 ENCOUNTER — Ambulatory Visit (INDEPENDENT_AMBULATORY_CARE_PROVIDER_SITE_OTHER): Payer: Medicare HMO | Admitting: Vascular Surgery

## 2022-06-13 ENCOUNTER — Encounter (INDEPENDENT_AMBULATORY_CARE_PROVIDER_SITE_OTHER): Payer: Self-pay | Admitting: Vascular Surgery

## 2022-06-13 VITALS — BP 121/80 | HR 66 | Resp 18 | Ht 62.0 in | Wt 187.1 lb

## 2022-06-13 DIAGNOSIS — I83813 Varicose veins of bilateral lower extremities with pain: Secondary | ICD-10-CM

## 2022-06-13 NOTE — Progress Notes (Signed)
Courtney Blanchard is a 67 y.o.female who presents with painful varicose veins of the left leg  Past Medical History:  Diagnosis Date   Anxiety    Aortic dissection 02/13/2019   LAD   Asthma    Bronchitis    Chronic cough    Complication of anesthesia    nausea and vomiting   COPD (chronic obstructive pulmonary disease)    Coronary artery disease    Depression    Diabetes mellitus without complication    Diet controlled   Diverticulosis    Dyspnea    Dysrhythmia    Environmental and seasonal allergies    Hypertension    Lower extremity edema    Migraine headache    2-4X/month   STEMI (ST elevation myocardial infarction) 02/13/2019   Tremors of nervous system    Wears dentures    Partial upper   Wheezing     Past Surgical History:  Procedure Laterality Date   ABDOMINAL HYSTERECTOMY     BARIATRIC SURGERY     CARDIAC CATHETERIZATION     with stent   CATARACT EXTRACTION W/PHACO Left 03/30/2021   Procedure: CATARACT EXTRACTION PHACO AND INTRAOCULAR LENS PLACEMENT ([place]) LEFT DIABETIC;  Surgeon: Leandrew Koyanagi, MD;  Location: [facility];  Service: Ophthalmology;  Laterality: Left;  3.39 0:31.6   CATARACT EXTRACTION W/PHACO Right 04/13/2021   Procedure: CATARACT EXTRACTION PHACO AND INTRAOCULAR LENS PLACEMENT ([place]) RIGHT DIABETIC;  Surgeon: Leandrew Koyanagi, MD;  Location: [facility];  Service: Ophthalmology;  Laterality: Right;  3.31 00:42.0   CHOLECYSTECTOMY     COLON SURGERY     LEFT HEART CATH AND CORONARY ANGIOGRAPHY N/A 01/28/2018   Procedure: LEFT HEART CATH AND CORONARY ANGIOGRAPHY;  Surgeon: Teodoro Spray, MD;  Location: [facility];  Service: Cardiovascular;  Laterality: N/A;   LEFT HEART CATH AND CORONARY ANGIOGRAPHY N/A 02/13/2019   Procedure: LEFT HEART CATH AND CORONARY ANGIOGRAPHY;  Surgeon: Isaias Cowman, MD;  Location: [facility];  Service: Cardiovascular;  Laterality: N/A;    Current Outpatient  Medications  Medication Sig Dispense Refill   acetaminophen (TYLENOL) 500 MG tablet Take 500-1,000 mg by mouth 2 (two) times daily as needed for moderate pain or headache.      ALPRAZolam (XANAX XR) 1 MG 24 hr tablet Take 1 mg by mouth daily.     aspirin 81 MG tablet Take 81 mg by mouth daily.      baclofen (LIORESAL) 10 MG tablet  (Patient not taking: Reported on 03/22/2021)     clopidogrel (PLAVIX) 75 MG tablet Take 1 tablet (75 mg total) by mouth daily. 30 tablet 0   cyclobenzaprine (FLEXERIL) 5 MG tablet  (Patient not taking: Reported on 03/22/2021)     DULoxetine (CYMBALTA) 20 MG capsule Take 20 mg by mouth daily.     DULoxetine (CYMBALTA) 20 MG capsule Take 40 mg by mouth.     gabapentin (NEURONTIN) 300 MG capsule Take 1 capsule by mouth 3 (three) times daily.     isosorbide mononitrate (IMDUR) 60 MG 24 hr tablet Take 1 tablet (60 mg total) by mouth daily. (Patient taking differently: Take 30 mg by mouth in the morning and at bedtime.) 30 tablet 0   montelukast (SINGULAIR) 10 MG tablet Take 10 mg by mouth daily.      nitroGLYCERIN (NITROSTAT) 0.4 MG SL tablet Place 1 tablet (0.4 mg total) under the tongue every 5 (five) minutes as needed for chest pain. 30 tablet 0  rOPINIRole (REQUIP) 0.25 MG tablet 0.5 mg at bedtime.     No current facility-administered medications for this visit.    Allergies  Allergen Reactions   Amoxicillin     Has patient had a PCN reaction causing immediate rash, facial/tongue/throat swelling, SOB or lightheadedness with hypotension: Yes Has patient had a PCN reaction causing severe rash involving mucus membranes or skin necrosis: No Has patient had a PCN reaction that required hospitalization: No Has patient had a PCN reaction occurring within the last 10 years: No If all of the above answers are "NO", then may proceed with Cephalosporin use.  Sore throat    Atorvastatin     Tremors     Indication: Patient presents with symptomatic varicose veins of the  left lower extremity.  Procedure: Foam sclerotherapy was performed on the left lower extremity. Using ultrasound guidance, 5 mL of foam Sotradecol was used to inject the varicosities of the left lower extremity. Compression wraps were placed. The patient tolerated the procedure well.

## 2022-06-16 DIAGNOSIS — E538 Deficiency of other specified B group vitamins: Secondary | ICD-10-CM | POA: Diagnosis not present

## 2022-06-20 ENCOUNTER — Telehealth (INDEPENDENT_AMBULATORY_CARE_PROVIDER_SITE_OTHER): Payer: Self-pay | Admitting: Vascular Surgery

## 2022-06-20 NOTE — Telephone Encounter (Signed)
Pt had sclero FOAM on 4.2.24 - Sat - afternoon, pt stated that she rode in the car for about 3 hours - states that after that, she could barely stand up and walk. States that it is really no better since Saturday. Inside of left eg - warm to touch, swollen. Nothing is helping the pain. Having a very hard time walking. Pt states that "something is not right". Please advise pt.

## 2022-06-20 NOTE — Telephone Encounter (Signed)
Foam Sch[place]o therapy irritates the vein and then taking a long drive likely exacerbated that.  She probably has a thrombophlebitis.  We can bring her in for a DVT study only to evaluate.  If it is negative for anything, it may not be related to her schlero and she may need to see her PCP

## 2022-06-20 NOTE — Telephone Encounter (Signed)
Per note from Sheppard Plumber, NP, I called pt and have scheduled her for a DVT study only tomorrow. If it is negative, she will contact PCP. If positive, we will get her with a provider here. Nothing further is needed at this time.

## 2022-06-21 ENCOUNTER — Ambulatory Visit (INDEPENDENT_AMBULATORY_CARE_PROVIDER_SITE_OTHER): Payer: Medicare HMO

## 2022-06-21 ENCOUNTER — Other Ambulatory Visit (INDEPENDENT_AMBULATORY_CARE_PROVIDER_SITE_OTHER): Payer: Self-pay | Admitting: Nurse Practitioner

## 2022-06-21 DIAGNOSIS — M79606 Pain in leg, unspecified: Secondary | ICD-10-CM

## 2022-06-21 DIAGNOSIS — M79605 Pain in left leg: Secondary | ICD-10-CM

## 2022-06-23 DIAGNOSIS — R053 Chronic cough: Secondary | ICD-10-CM | POA: Diagnosis not present

## 2022-06-23 DIAGNOSIS — J4520 Mild intermittent asthma, uncomplicated: Secondary | ICD-10-CM | POA: Diagnosis not present

## 2022-07-25 ENCOUNTER — Encounter (INDEPENDENT_AMBULATORY_CARE_PROVIDER_SITE_OTHER): Payer: Self-pay | Admitting: Vascular Surgery

## 2022-07-25 ENCOUNTER — Ambulatory Visit (INDEPENDENT_AMBULATORY_CARE_PROVIDER_SITE_OTHER): Payer: Medicare HMO | Admitting: Vascular Surgery

## 2022-07-25 VITALS — BP 126/76 | HR 61 | Resp 18 | Ht 62.0 in | Wt 191.0 lb

## 2022-07-25 DIAGNOSIS — I83813 Varicose veins of bilateral lower extremities with pain: Secondary | ICD-10-CM

## 2022-07-25 NOTE — Progress Notes (Signed)
Courtney Blanchard is a 67 y.o.female who presents with painful varicose veins of the left leg  Past Medical History:  Diagnosis Date   Anxiety    Aortic dissection ([facility]) 02/13/2019   LAD   Asthma    Bronchitis    Chronic cough    Complication of anesthesia    nausea and vomiting   COPD (chronic obstructive pulmonary disease) ([facility])    Coronary artery disease    Depression    Diabetes mellitus without complication ([facility])    Diet controlled   Diverticulosis    Dyspnea    Dysrhythmia    Environmental and seasonal allergies    Hypertension    Lower extremity edema    Migraine headache    2-4X/month   STEMI (ST elevation myocardial infarction) ([facility]) 02/13/2019   Tremors of nervous system    Wears dentures    Partial upper   Wheezing     Past Surgical History:  Procedure Laterality Date   ABDOMINAL HYSTERECTOMY     BARIATRIC SURGERY     CARDIAC CATHETERIZATION     with stent   CATARACT EXTRACTION W/PHACO Left 03/30/2021   Procedure: CATARACT EXTRACTION PHACO AND INTRAOCULAR LENS PLACEMENT ([facility]) LEFT DIABETIC;  Surgeon: Lockie Mola, MD;  Location: [facility];  Service: Ophthalmology;  Laterality: Left;  3.39 0:31.6   CATARACT EXTRACTION W/PHACO Right 04/13/2021   Procedure: CATARACT EXTRACTION PHACO AND INTRAOCULAR LENS PLACEMENT ([facility]) RIGHT DIABETIC;  Surgeon: Lockie Mola, MD;  Location: [facility];  Service: Ophthalmology;  Laterality: Right;  3.31 00:42.0   CHOLECYSTECTOMY     COLON SURGERY     LEFT HEART CATH AND CORONARY ANGIOGRAPHY N/A 01/28/2018   Procedure: LEFT HEART CATH AND CORONARY ANGIOGRAPHY;  Surgeon: Dalia Heading, MD;  Location: [facility];  Service: Cardiovascular;  Laterality: N/A;   LEFT HEART CATH AND CORONARY ANGIOGRAPHY N/A 02/13/2019   Procedure: LEFT HEART CATH AND CORONARY ANGIOGRAPHY;  Surgeon: Marcina Millard, MD;  Location: [facility];  Service: Cardiovascular;  Laterality: N/A;     Current Outpatient Medications  Medication Sig Dispense Refill   acetaminophen (TYLENOL) 500 MG tablet Take 500-1,000 mg by mouth 2 (two) times daily as needed for moderate pain or headache.      ALPRAZolam (XANAX XR) 1 MG 24 hr tablet Take 1 mg by mouth daily.     aspirin 81 MG tablet Take 81 mg by mouth daily.      baclofen (LIORESAL) 10 MG tablet  (Patient not taking: Reported on 03/22/2021)     clopidogrel (PLAVIX) 75 MG tablet Take 1 tablet (75 mg total) by mouth daily. 30 tablet 0   cyclobenzaprine (FLEXERIL) 5 MG tablet  (Patient not taking: Reported on 03/22/2021)     DULoxetine (CYMBALTA) 20 MG capsule Take 20 mg by mouth daily.     DULoxetine (CYMBALTA) 20 MG capsule Take 40 mg by mouth.     gabapentin (NEURONTIN) 300 MG capsule Take 1 capsule by mouth 3 (three) times daily.     isosorbide mononitrate (IMDUR) 60 MG 24 hr tablet Take 1 tablet (60 mg total) by mouth daily. (Patient taking differently: Take 30 mg by mouth in the morning and at bedtime.) 30 tablet 0   montelukast (SINGULAIR) 10 MG tablet Take 10 mg by mouth daily.      nitroGLYCERIN (NITROSTAT) 0.4 MG SL tablet Place 1 tablet (0.4 mg total) under the tongue every 5 (five) minutes as needed for chest pain. 30  tablet 0   rOPINIRole (REQUIP) 0.25 MG tablet 0.5 mg at bedtime.     No current facility-administered medications for this visit.    Allergies  Allergen Reactions   Amoxicillin     Has patient had a PCN reaction causing immediate rash, facial/tongue/throat swelling, SOB or lightheadedness with hypotension: Yes Has patient had a PCN reaction causing severe rash involving mucus membranes or skin necrosis: No Has patient had a PCN reaction that required hospitalization: No Has patient had a PCN reaction occurring within the last 10 years: No If all of the above answers are "NO", then may proceed with Cephalosporin use.  Sore throat    Atorvastatin     Tremors     Indication: Patient presents with  symptomatic varicose veins of the left lower extremity.  Procedure: Foam sclerotherapy was performed on the left lower extremity. Using ultrasound guidance, 5 mL of foam Sotradecol was used to inject the varicosities of the left lower extremity. Compression wraps were placed. The patient tolerated the procedure well.

## 2022-07-29 DIAGNOSIS — B370 Candidal stomatitis: Secondary | ICD-10-CM | POA: Diagnosis not present

## 2022-07-29 DIAGNOSIS — J029 Acute pharyngitis, unspecified: Secondary | ICD-10-CM | POA: Diagnosis not present

## 2022-08-02 DIAGNOSIS — G43909 Migraine, unspecified, not intractable, without status migrainosus: Secondary | ICD-10-CM | POA: Diagnosis not present

## 2022-08-02 DIAGNOSIS — M25562 Pain in left knee: Secondary | ICD-10-CM | POA: Diagnosis not present

## 2022-08-02 DIAGNOSIS — E538 Deficiency of other specified B group vitamins: Secondary | ICD-10-CM | POA: Diagnosis not present

## 2022-08-02 DIAGNOSIS — I25118 Atherosclerotic heart disease of native coronary artery with other forms of angina pectoris: Secondary | ICD-10-CM | POA: Diagnosis not present

## 2022-08-02 DIAGNOSIS — I10 Essential (primary) hypertension: Secondary | ICD-10-CM | POA: Diagnosis not present

## 2022-08-02 DIAGNOSIS — E1169 Type 2 diabetes mellitus with other specified complication: Secondary | ICD-10-CM | POA: Diagnosis not present

## 2022-08-02 DIAGNOSIS — Z1331 Encounter for screening for depression: Secondary | ICD-10-CM | POA: Diagnosis not present

## 2022-08-02 DIAGNOSIS — R946 Abnormal results of thyroid function studies: Secondary | ICD-10-CM | POA: Diagnosis not present

## 2022-08-02 DIAGNOSIS — F3342 Major depressive disorder, recurrent, in full remission: Secondary | ICD-10-CM | POA: Diagnosis not present

## 2022-08-02 DIAGNOSIS — E118 Type 2 diabetes mellitus with unspecified complications: Secondary | ICD-10-CM | POA: Diagnosis not present

## 2022-08-02 DIAGNOSIS — J449 Chronic obstructive pulmonary disease, unspecified: Secondary | ICD-10-CM | POA: Diagnosis not present

## 2022-08-02 DIAGNOSIS — Z0000 Encounter for general adult medical examination without abnormal findings: Secondary | ICD-10-CM | POA: Diagnosis not present

## 2022-08-02 DIAGNOSIS — E785 Hyperlipidemia, unspecified: Secondary | ICD-10-CM | POA: Diagnosis not present

## 2022-08-10 ENCOUNTER — Other Ambulatory Visit: Payer: Self-pay | Admitting: Family Medicine

## 2022-08-10 DIAGNOSIS — Z1231 Encounter for screening mammogram for malignant neoplasm of breast: Secondary | ICD-10-CM

## 2022-08-11 DIAGNOSIS — M1712 Unilateral primary osteoarthritis, left knee: Secondary | ICD-10-CM | POA: Diagnosis not present

## 2022-08-14 ENCOUNTER — Ambulatory Visit
Admission: RE | Admit: 2022-08-14 | Discharge: 2022-08-14 | Disposition: A | Discharge status: Home / Self Care | Payer: Medicare HMO | Source: Ambulatory Visit | Attending: Family Medicine | Admitting: Family Medicine

## 2022-08-14 DIAGNOSIS — Z1231 Encounter for screening mammogram for malignant neoplasm of breast: Secondary | ICD-10-CM | POA: Insufficient documentation

## 2022-08-15 ENCOUNTER — Ambulatory Visit (INDEPENDENT_AMBULATORY_CARE_PROVIDER_SITE_OTHER): Payer: Medicare HMO | Admitting: Vascular Surgery

## 2022-08-15 ENCOUNTER — Other Ambulatory Visit: Payer: Self-pay | Admitting: Orthopedic Surgery

## 2022-08-15 ENCOUNTER — Encounter (INDEPENDENT_AMBULATORY_CARE_PROVIDER_SITE_OTHER): Payer: Self-pay | Admitting: Vascular Surgery

## 2022-08-15 VITALS — BP 114/73 | HR 75 | Resp 16 | Wt 188.2 lb

## 2022-08-15 DIAGNOSIS — M2342 Loose body in knee, left knee: Secondary | ICD-10-CM

## 2022-08-15 DIAGNOSIS — I83813 Varicose veins of bilateral lower extremities with pain: Secondary | ICD-10-CM | POA: Diagnosis not present

## 2022-08-15 NOTE — Progress Notes (Signed)
Courtney Blanchard is a 67 y.o.female who presents with painful varicose veins of the left leg  Past Medical History:  Diagnosis Date   Anxiety    Aortic dissection (HCC) 02/13/2019   LAD   Asthma    Bronchitis    Chronic cough    Complication of anesthesia    nausea and vomiting   COPD (chronic obstructive pulmonary disease) (HCC)    Coronary artery disease    Depression    Diabetes mellitus without complication (HCC)    Diet controlled   Diverticulosis    Dyspnea    Dysrhythmia    Environmental and seasonal allergies    Hypertension    Lower extremity edema    Migraine headache    2-4X/month   STEMI (ST elevation myocardial infarction) (HCC) 02/13/2019   Tremors of nervous system    Wears dentures    Partial upper   Wheezing     Past Surgical History:  Procedure Laterality Date   ABDOMINAL HYSTERECTOMY     BARIATRIC SURGERY     CARDIAC CATHETERIZATION     with stent   CATARACT EXTRACTION W/PHACO Left 03/30/2021   Procedure: CATARACT EXTRACTION PHACO AND INTRAOCULAR LENS PLACEMENT (IOC) LEFT DIABETIC;  Surgeon: Lockie Mola, MD;  Location: [facility];  Service: Ophthalmology;  Laterality: Left;  3.39 0:31.6   CATARACT EXTRACTION W/PHACO Right 04/13/2021   Procedure: CATARACT EXTRACTION PHACO AND INTRAOCULAR LENS PLACEMENT (IOC) RIGHT DIABETIC;  Surgeon: Lockie Mola, MD;  Location: [facility];  Service: Ophthalmology;  Laterality: Right;  3.31 00:42.0   CHOLECYSTECTOMY     COLON SURGERY     LEFT HEART CATH AND CORONARY ANGIOGRAPHY N/A 01/28/2018   Procedure: LEFT HEART CATH AND CORONARY ANGIOGRAPHY;  Surgeon: Dalia Heading, MD;  Location: [facility];  Service: Cardiovascular;  Laterality: N/A;   LEFT HEART CATH AND CORONARY ANGIOGRAPHY N/A 02/13/2019   Procedure: LEFT HEART CATH AND CORONARY ANGIOGRAPHY;  Surgeon: Marcina Millard, MD;  Location: [facility];  Service: Cardiovascular;  Laterality: N/A;     Current Outpatient Medications  Medication Sig Dispense Refill   acetaminophen (TYLENOL) 500 MG tablet Take 500-1,000 mg by mouth 2 (two) times daily as needed for moderate pain or headache.      ALPRAZolam (XANAX XR) 1 MG 24 hr tablet Take 1 mg by mouth daily.     aspirin 81 MG tablet Take 81 mg by mouth daily.      clopidogrel (PLAVIX) 75 MG tablet Take 1 tablet (75 mg total) by mouth daily. 30 tablet 0   DULoxetine (CYMBALTA) 20 MG capsule Take 20 mg by mouth daily.     DULoxetine (CYMBALTA) 20 MG capsule Take 40 mg by mouth.     isosorbide mononitrate (IMDUR) 60 MG 24 hr tablet Take 1 tablet (60 mg total) by mouth daily. (Patient taking differently: Take 30 mg by mouth in the morning and at bedtime.) 30 tablet 0   montelukast (SINGULAIR) 10 MG tablet Take 10 mg by mouth daily.      nitroGLYCERIN (NITROSTAT) 0.4 MG SL tablet Place 1 tablet (0.4 mg total) under the tongue every 5 (five) minutes as needed for chest pain. 30 tablet 0   rOPINIRole (REQUIP) 0.25 MG tablet 0.5 mg at bedtime.     baclofen (LIORESAL) 10 MG tablet  (Patient not taking: Reported on 03/22/2021)     cyclobenzaprine (FLEXERIL) 5 MG tablet  (Patient not taking: Reported on 03/22/2021)     gabapentin (  NEURONTIN) 300 MG capsule Take 1 capsule by mouth 3 (three) times daily.     No current facility-administered medications for this visit.    Allergies  Allergen Reactions   Amoxicillin     Has patient had a PCN reaction causing immediate rash, facial/tongue/throat swelling, SOB or lightheadedness with hypotension: Yes Has patient had a PCN reaction causing severe rash involving mucus membranes or skin necrosis: No Has patient had a PCN reaction that required hospitalization: No Has patient had a PCN reaction occurring within the last 10 years: No If all of the above answers are "NO", then may proceed with Cephalosporin use.  Sore throat    Atorvastatin     Tremors     Indication: Patient presents with  symptomatic varicose veins of the left lower extremity.  Procedure: Foam sclerotherapy was performed on the left lower extremity. Using ultrasound guidance, 5 mL of foam Sotradecol was used to inject the varicosities of the left lower extremity. Compression wraps were placed. The patient tolerated the procedure well.

## 2022-08-21 ENCOUNTER — Encounter: Payer: Self-pay | Admitting: Orthopedic Surgery

## 2022-08-26 ENCOUNTER — Ambulatory Visit
Admission: RE | Admit: 2022-08-26 | Discharge: 2022-08-26 | Disposition: A | Discharge status: Home / Self Care | Payer: Medicare HMO | Source: Ambulatory Visit | Attending: Orthopedic Surgery | Admitting: Orthopedic Surgery

## 2022-08-26 DIAGNOSIS — M2342 Loose body in knee, left knee: Secondary | ICD-10-CM

## 2022-09-08 DIAGNOSIS — M1712 Unilateral primary osteoarthritis, left knee: Secondary | ICD-10-CM | POA: Diagnosis not present

## 2022-09-08 DIAGNOSIS — M2342 Loose body in knee, left knee: Secondary | ICD-10-CM | POA: Diagnosis not present

## 2022-09-12 ENCOUNTER — Ambulatory Visit (INDEPENDENT_AMBULATORY_CARE_PROVIDER_SITE_OTHER): Payer: Medicare HMO | Admitting: Vascular Surgery

## 2022-09-16 ENCOUNTER — Ambulatory Visit
Admission: RE | Admit: 2022-09-16 | Discharge: 2022-09-16 | Disposition: A | Discharge status: Home / Self Care | Payer: Medicare HMO | Source: Ambulatory Visit | Attending: Orthopedic Surgery | Admitting: Orthopedic Surgery

## 2022-09-16 DIAGNOSIS — M1712 Unilateral primary osteoarthritis, left knee: Secondary | ICD-10-CM | POA: Diagnosis not present

## 2022-09-16 DIAGNOSIS — M23222 Derangement of posterior horn of medial meniscus due to old tear or injury, left knee: Secondary | ICD-10-CM | POA: Diagnosis not present

## 2022-09-25 DIAGNOSIS — S83232A Complex tear of medial meniscus, current injury, left knee, initial encounter: Secondary | ICD-10-CM | POA: Diagnosis not present

## 2022-09-25 DIAGNOSIS — M23301 Other meniscus derangements, unspecified lateral meniscus, left knee: Secondary | ICD-10-CM | POA: Diagnosis not present

## 2022-09-25 DIAGNOSIS — M1712 Unilateral primary osteoarthritis, left knee: Secondary | ICD-10-CM | POA: Diagnosis not present

## 2022-10-10 ENCOUNTER — Encounter (INDEPENDENT_AMBULATORY_CARE_PROVIDER_SITE_OTHER): Payer: Self-pay | Admitting: Vascular Surgery

## 2022-10-10 ENCOUNTER — Other Ambulatory Visit: Payer: Self-pay | Admitting: Surgery

## 2022-10-10 ENCOUNTER — Ambulatory Visit (INDEPENDENT_AMBULATORY_CARE_PROVIDER_SITE_OTHER): Payer: Medicare HMO | Admitting: Vascular Surgery

## 2022-10-10 VITALS — BP 107/70 | HR 65 | Resp 18 | Ht 62.0 in | Wt 188.0 lb

## 2022-10-10 DIAGNOSIS — I83813 Varicose veins of bilateral lower extremities with pain: Secondary | ICD-10-CM

## 2022-10-10 DIAGNOSIS — I83812 Varicose veins of left lower extremities with pain: Secondary | ICD-10-CM

## 2022-10-10 NOTE — Progress Notes (Signed)
Courtney Blanchard is a 67 y.o.female who presents with painful varicose veins of the left leg  Past Medical History:  Diagnosis Date   Anxiety    Aortic dissection (HCC) 02/13/2019   LAD   Asthma    Bronchitis    Chronic cough    Complication of anesthesia    nausea and vomiting   COPD (chronic obstructive pulmonary disease) (HCC)    Coronary artery disease    Depression    Diabetes mellitus without complication (HCC)    Diet controlled   Diverticulosis    Dyspnea    Dysrhythmia    Environmental and seasonal allergies    Hypertension    Lower extremity edema    Migraine headache    2-4X/month   STEMI (ST elevation myocardial infarction) (HCC) 02/13/2019   Tremors of nervous system    Wears dentures    Partial upper   Wheezing     Past Surgical History:  Procedure Laterality Date   ABDOMINAL HYSTERECTOMY     BARIATRIC SURGERY     CARDIAC CATHETERIZATION     with stent   CATARACT EXTRACTION W/PHACO Left 03/30/2021   Procedure: CATARACT EXTRACTION PHACO AND INTRAOCULAR LENS PLACEMENT (IOC) LEFT DIABETIC;  Surgeon: Lockie Mola, MD;  Location: [facility];  Service: Ophthalmology;  Laterality: Left;  3.39 0:31.6   CATARACT EXTRACTION W/PHACO Right 04/13/2021   Procedure: CATARACT EXTRACTION PHACO AND INTRAOCULAR LENS PLACEMENT (IOC) RIGHT DIABETIC;  Surgeon: Lockie Mola, MD;  Location: [facility];  Service: Ophthalmology;  Laterality: Right;  3.31 00:42.0   CHOLECYSTECTOMY     COLON SURGERY     LEFT HEART CATH AND CORONARY ANGIOGRAPHY N/A 01/28/2018   Procedure: LEFT HEART CATH AND CORONARY ANGIOGRAPHY;  Surgeon: Dalia Heading, MD;  Location: [facility];  Service: Cardiovascular;  Laterality: N/A;   LEFT HEART CATH AND CORONARY ANGIOGRAPHY N/A 02/13/2019   Procedure: LEFT HEART CATH AND CORONARY ANGIOGRAPHY;  Surgeon: Marcina Millard, MD;  Location: [facility];  Service: Cardiovascular;  Laterality: N/A;     Current Outpatient Medications  Medication Sig Dispense Refill   acetaminophen (TYLENOL) 500 MG tablet Take 500-1,000 mg by mouth 2 (two) times daily as needed for moderate pain or headache.      ALPRAZolam (XANAX XR) 1 MG 24 hr tablet Take 1 mg by mouth daily.     aspirin 81 MG tablet Take 81 mg by mouth daily.      baclofen (LIORESAL) 10 MG tablet  (Patient not taking: Reported on 03/22/2021)     clopidogrel (PLAVIX) 75 MG tablet Take 1 tablet (75 mg total) by mouth daily. 30 tablet 0   cyclobenzaprine (FLEXERIL) 5 MG tablet  (Patient not taking: Reported on 03/22/2021)     DULoxetine (CYMBALTA) 20 MG capsule Take 20 mg by mouth daily.     DULoxetine (CYMBALTA) 20 MG capsule Take 40 mg by mouth.     gabapentin (NEURONTIN) 300 MG capsule Take 1 capsule by mouth 3 (three) times daily.     isosorbide mononitrate (IMDUR) 60 MG 24 hr tablet Take 1 tablet (60 mg total) by mouth daily. (Patient taking differently: Take 30 mg by mouth in the morning and at bedtime.) 30 tablet 0   montelukast (SINGULAIR) 10 MG tablet Take 10 mg by mouth daily.      nitroGLYCERIN (NITROSTAT) 0.4 MG SL tablet Place 1 tablet (0.4 mg total) under the tongue every 5 (five) minutes as needed for chest pain. 30  tablet 0   rOPINIRole (REQUIP) 0.25 MG tablet 0.5 mg at bedtime.     No current facility-administered medications for this visit.    Allergies  Allergen Reactions   Amoxicillin     Has patient had a PCN reaction causing immediate rash, facial/tongue/throat swelling, SOB or lightheadedness with hypotension: Yes Has patient had a PCN reaction causing severe rash involving mucus membranes or skin necrosis: No Has patient had a PCN reaction that required hospitalization: No Has patient had a PCN reaction occurring within the last 10 years: No If all of the above answers are "NO", then may proceed with Cephalosporin use.  Sore throat    Atorvastatin     Tremors     Indication: Patient presents with  symptomatic varicose veins of the left lower extremity.  Procedure: Foam sclerotherapy was performed on the left lower extremity. Using ultrasound guidance, 5 mL of foam Sotradecol was used to inject the varicosities of the left lower extremity. Compression wraps were placed. The patient tolerated the procedure well.

## 2022-10-11 ENCOUNTER — Other Ambulatory Visit: Payer: Self-pay

## 2022-10-11 ENCOUNTER — Encounter
Admission: RE | Admit: 2022-10-11 | Discharge: 2022-10-11 | Disposition: A | Discharge status: Home / Self Care | Payer: Medicare HMO | Source: Ambulatory Visit | Attending: Surgery | Admitting: Surgery

## 2022-10-11 DIAGNOSIS — Z01812 Encounter for preprocedural laboratory examination: Secondary | ICD-10-CM

## 2022-10-11 DIAGNOSIS — I214 Non-ST elevation (NSTEMI) myocardial infarction: Secondary | ICD-10-CM

## 2022-10-11 DIAGNOSIS — E119 Type 2 diabetes mellitus without complications: Secondary | ICD-10-CM

## 2022-10-11 DIAGNOSIS — I2102 ST elevation (STEMI) myocardial infarction involving left anterior descending coronary artery: Secondary | ICD-10-CM

## 2022-10-11 HISTORY — DX: Other meniscus derangements, unspecified lateral meniscus, left knee: M23.301

## 2022-10-11 HISTORY — DX: Unilateral primary osteoarthritis, left knee: M17.12

## 2022-10-11 HISTORY — DX: Pneumonia, unspecified organism: J18.9

## 2022-10-11 HISTORY — DX: Other specified postprocedural states: Z98.890

## 2022-10-11 HISTORY — DX: Complex tear of medial meniscus, current injury, left knee, initial encounter: S83.232A

## 2022-10-11 NOTE — Patient Instructions (Addendum)
Your procedure is scheduled on:10/19/22 - THURSDAY Report to the Registration Desk on the 1st floor of the [facility]. To find out your arrival time, please call [phone] between 1PM - 3PM on: 10/18/22 [facility] If your arrival time is 6:00 am, do not arrive before that time as the [facility] entrance doors do not open until 6:00 am.  REMEMBER: Instructions that are not followed completely may result in serious medical risk, up to and including death; or upon the discretion of your surgeon and anesthesiologist your surgery may need to be rescheduled.  Do not eat food after midnight the night before surgery.  No gum chewing or hard candies.  You MAY drink WATER up to 2 hours before you are scheduled to arrive for your surgery. Do not drink anything within 2 hours of your scheduled arrival time.   In addition, your doctor has ordered for you to drink the provided:  Gatorade G2 Drinking this carbohydrate drink up to two hours before surgery helps to reduce insulin resistance and improve patient outcomes. Please complete drinking 2 hours before scheduled arrival time.  One week prior to surgery: Stop Anti-inflammatories (NSAIDS) such as Advil, Aleve, Ibuprofen, Motrin, Naproxen, Naprosyn and Aspirin based products such as Excedrin, Goody's Powder, BC Powder.  Stop ANY OVER THE COUNTER supplements until after surgery.  You may however, continue to take Tylenol if needed for pain up until the day of surgery.  Continue taking all prescribed medications with the exception of the following:  HOLD clopidogrel (PLAVIX) beginning 10/14/22.   TAKE ONLY THESE MEDICATIONS THE MORNING OF SURGERY WITH A SIP OF WATER:  DULoxetine (CYMBALTA)  gabapentin (NEURONTIN)  isosorbide mononitrate (IMDUR)  montelukast (SINGULAIR)  ALPRAZolam (XANAX XR) IF NEEDED   No Alcohol for 24 hours before or after surgery.  No Smoking including e-cigarettes for 24 hours before surgery.  No chewable  tobacco products for at least 6 hours before surgery.  No nicotine patches on the day of surgery.  Do not use any "recreational" drugs for at least a week (preferably 2 weeks) before your surgery.  Please be advised that the combination of cocaine and anesthesia may have negative outcomes, up to and including death. If you test positive for cocaine, your surgery will be cancelled.  On the morning of surgery brush your teeth with toothpaste and water, you may rinse your mouth with mouthwash if you wish. Do not swallow any toothpaste or mouthwash.  Use CHG Soap or wipes as directed on instruction sheet.  Do not wear jewelry, make-up, hairpins, clips or nail polish.  Do not wear lotions, powders, or perfumes.   Do not shave body hair from the neck down 48 hours before surgery.  Contact lenses, hearing aids and dentures may not be worn into surgery.  Do not bring valuables to the hospital. [facility] is not responsible for any missing/lost belongings or valuables.   Notify your doctor if there is any change in your medical condition (cold, fever, infection).  Wear comfortable clothing (specific to your surgery type) to the hospital.  After surgery, you can help prevent lung complications by doing breathing exercises.  Take deep breaths and cough every 1-2 hours. Your doctor may order a device called an Incentive Spirometer to help you take deep breaths. When coughing or sneezing, hold a pillow firmly against your incision with both hands. This is called "splinting." Doing this helps protect your incision. It also decreases belly discomfort.  If you are being admitted  to the hospital overnight, leave your suitcase in the car. After surgery it may be brought to your room.  In case of increased patient census, it may be necessary for you, the patient, to continue your postoperative care in the Same Day Surgery department.  If you are being discharged the day of surgery, you will not be  allowed to drive home. You will need a responsible individual to drive you home and stay with you for 24 hours after surgery.   If you are taking public transportation, you will need to have a responsible individual with you.  Please call the Pre-admissions Testing Dept. at [phone] if you have any questions about these instructions.  Surgery Visitation Policy:  Patients having surgery or a procedure may have two visitors.  Children under the age of 68 must have an adult with them who is not the patient.  Inpatient Visitation:    Visiting hours are 7 a.m. to 8 p.m. Up to four visitors are allowed at one time in a patient room. The visitors may rotate out with other people during the day.  One visitor age 6 or older may stay with the patient overnight and must be in the room by 8 p.m.     Preparing for Surgery with CHLORHEXIDINE GLUCONATE (CHG) Soap  Chlorhexidine Gluconate (CHG) Soap  o An antiseptic cleaner that kills germs and bonds with the skin to continue killing germs even after washing  o Used for showering the night before surgery and morning of surgery  Before surgery, you can play an important role by reducing the number of germs on your skin.  CHG (Chlorhexidine gluconate) soap is an antiseptic cleanser which kills germs and bonds with the skin to continue killing germs even after washing.  Please do not use if you have an allergy to CHG or antibacterial soaps. If your skin becomes reddened/irritated stop using the CHG.  1. Shower the NIGHT BEFORE SURGERY and the MORNING OF SURGERY with CHG soap.  2. If you choose to wash your hair, wash your hair first as usual with your normal shampoo.  3. After shampooing, rinse your hair and body thoroughly to remove the shampoo.  4. Use CHG as you would any other liquid soap. You can apply CHG directly to the skin and wash gently with a scrungie or a clean washcloth.  5. Apply the CHG soap to your body only from the  neck down. Do not use on open wounds or open sores. Avoid contact with your eyes, ears, mouth, and genitals (private parts). Wash face and genitals (private parts) with your normal soap.  6. Wash thoroughly, paying special attention to the area where your surgery will be performed.  7. Thoroughly rinse your body with warm water.  8. Do not shower/wash with your normal soap after using and rinsing off the CHG soap.  9. Pat yourself dry with a clean towel.  10. Wear clean pajamas to bed the night before surgery.  12. Place clean sheets on your bed the night of your first shower and do not sleep with pets.  13. Shower again with the CHG soap on the day of surgery prior to arriving at the hospital.  14. Do not apply any deodorants/lotions/powders.  15. Please wear clean clothes to the hospital.  How to Use an Incentive Spirometer  An incentive spirometer is a tool that measures how well you are filling your lungs with each breath. Learning to take long, deep breaths using  this tool can help you keep your lungs clear and active. This may help to reverse or lessen your chance of developing breathing (pulmonary) problems, especially infection. You may be asked to use a spirometer: After a surgery. If you have a lung problem or a history of smoking. After a long period of time when you have been unable to move or be active. If the spirometer includes an indicator to show the highest number that you have reached, your health care provider or respiratory therapist will help you set a goal. Keep a log of your progress as told by your health care provider. What are the risks? Breathing too quickly may cause dizziness or cause you to pass out. Take your time so you do not get dizzy or light-headed. If you are in pain, you may need to take pain medicine before doing incentive spirometry. It is harder to take a deep breath if you are having pain. How to use your incentive spirometer  Sit up on the  edge of your bed or on a chair. Hold the incentive spirometer so that it is in an upright position. Before you use the spirometer, breathe out normally. Place the mouthpiece in your mouth. Make sure your lips are closed tightly around it. Breathe in slowly and as deeply as you can through your mouth, causing the piston or the ball to rise toward the top of the chamber. Hold your breath for 3-5 seconds, or for as long as possible. If the spirometer includes a coach indicator, use this to guide you in breathing. Slow down your breathing if the indicator goes above the marked areas. Remove the mouthpiece from your mouth and breathe out normally. The piston or ball will return to the bottom of the chamber. Rest for a few seconds, then repeat the steps 10 or more times. Take your time and take a few normal breaths between deep breaths so that you do not get dizzy or light-headed. Do this every 1-2 hours when you are awake. If the spirometer includes a goal marker to show the highest number you have reached (best effort), use this as a goal to work toward during each repetition. After each set of 10 deep breaths, cough a few times. This will help to make sure that your lungs are clear. If you have an incision on your chest or abdomen from surgery, place a pillow or a rolled-up towel firmly against the incision when you cough. This can help to reduce pain while taking deep breaths and coughing. General tips When you are able to get out of bed: Walk around often. Continue to take deep breaths and cough in order to clear your lungs. Keep using the incentive spirometer until your health care provider says it is okay to stop using it. If you have been in the hospital, you may be told to keep using the spirometer at home. Contact a health care provider if: You are having difficulty using the spirometer. You have trouble using the spirometer as often as instructed. Your pain medicine is not giving enough  relief for you to use the spirometer as told. You have a fever. Get help right away if: You develop shortness of breath. You develop a cough with bloody mucus from the lungs. You have fluid or blood coming from an incision site after you cough. Summary An incentive spirometer is a tool that can help you learn to take long, deep breaths to keep your lungs clear and active. You may  be asked to use a spirometer after a surgery, if you have a lung problem or a history of smoking, or if you have been inactive for a long period of time. Use your incentive spirometer as instructed every 1-2 hours while you are awake. If you have an incision on your chest or abdomen, place a pillow or a rolled-up towel firmly against your incision when you cough. This will help to reduce pain. Get help right away if you have shortness of breath, you cough up bloody mucus, or blood comes from your incision when you cough. This information is not intended to replace advice given to you by your health care provider. Make sure you discuss any questions you have with your health care provider. Document Revised: 05/19/2019 Document Reviewed: 05/19/2019 Elsevier Patient Education  2023 [employer].

## 2022-10-12 ENCOUNTER — Encounter
Admission: RE | Admit: 2022-10-12 | Discharge: 2022-10-12 | Disposition: A | Discharge status: Home / Self Care | Payer: Medicare HMO | Source: Ambulatory Visit | Attending: Surgery | Admitting: Surgery

## 2022-10-12 DIAGNOSIS — I2102 ST elevation (STEMI) myocardial infarction involving left anterior descending coronary artery: Secondary | ICD-10-CM | POA: Insufficient documentation

## 2022-10-12 DIAGNOSIS — I214 Non-ST elevation (NSTEMI) myocardial infarction: Secondary | ICD-10-CM

## 2022-10-16 ENCOUNTER — Encounter: Payer: Self-pay | Admitting: Surgery

## 2022-10-16 NOTE — Progress Notes (Signed)
Perioperative / Anesthesia Services  Pre-Admission Testing Clinical Review / Preoperative Anesthesia Consult  Date: 10/17/22  Patient Demographics:  Name: Courtney Blanchard DOB:   [birth year] MRN:   [id number]  Planned Surgical Procedure(s):    Case: [id number] Date/Time: 10/19/22 1319   Procedure: LEFT KNEE ARTHROSCOPY WITH DEBRIDEMENT, PARTIAL MEDIAL AND LATERAL MENISCECTOMIES, AND REINJECTION OF HARVESTED INFRAPATELLAR FAT CELLS. (Left: Knee) - MAKE 3RD CASE   Anesthesia type: Choice   Pre-op diagnosis:      Complex tear of medial meniscus of left knee as current injury, initial encounter S83.232A     Degenerative tear of lateral meniscus, left M23.301     Primary osteoarthritis of left knee M17.12   Location: [facility] OR ROOM 03 / [facility]   Surgeons: Christena Flake, MD     NOTE: Available PAT nursing documentation and vital signs have been reviewed. Clinical nursing staff has updated patient's PMH/PSHx, current medication list, and drug allergies/intolerances to ensure comprehensive history available to assist in medical decision making as it pertains to the aforementioned surgical procedure and anticipated anesthetic course. Extensive review of available clinical information personally performed. [place] PMH and PSHx updated with any diagnoses/procedures that  may have been inadvertently omitted during her intake with the pre-admission testing department's nursing staff.  Clinical Discussion:  Courtney Blanchard is a 67 y.o. female who is submitted for pre-surgical anesthesia review and clearance prior to her undergoing the above procedure. Patient has never been a smoker. Pertinent PMH includes: CAD, anterior STEMI, SCAD (mLAD), palpitations, unstable angina, HTN, HLD, T2DM, COPD, asthma, tremors, right BILATERAL lower extremity edema, OA, LEFT medial meniscus tear, BILATERAL lower extremity venous varicosities, RLS, depression, anxiety (on BZO).  Patient is  followed by cardiology Darrold Junker, MD). She was last seen in the cardiology clinic on 01/19/2022; notes reviewed. At the time of her clinic visit, patient being seen in hospital follow-up following several day history of chest pain and shortness of breath with associated fatigue.  Cardiac workup was negative.  Patient with increased cough. Patient denied any PND, orthopnea, palpitations, significant peripheral edema, weakness, vertiginous symptoms, or presyncope/syncope. Patient with a past medical history significant for cardiovascular diagnoses. Documented physical exam was grossly benign, providing no evidence of acute exacerbation and/or decompensation of the patient's known cardiovascular conditions.  Diagnostic LEFT heart catheterization was performed on 01/13/2004 revealing extensive single-vessel CAD; 70% ostial RCA, 50% proximal RCA, and 90% mid RCA.  PCI was subsequently performed placing a 3.5 x 8 mm Vision BMS x 1 to the ostial RCA, a 3.5 x 23 mm Cypher DES x 1 to the proximal RCA, and a 3.5 x 23 mm Cypher DES x 1 to the mid RCA.  Procedure yielded excellent angiographic result and TIMI-3 flow.  Patient with recurrent angina necessitating repeat diagnostic LEFT heart catheterization on 03/24/2004.  95% in-stent restenosis noted of the previously placed stent to the proximal RCA.  PCI was performed placing a 3.5 x 18 mm Cypher DES x 1 to the proximal RCA.  Procedure yielded excellent angiographic result and TIMI-3 flow.  Diagnostic LEFT heart catheterizations performed on 05/26/2008 and 01/18/2011 revealed stable coronary anatomy and no evidence of in-stent restenosis or further obstructive coronary artery disease.  Most recent myocardial perfusion imaging study was performed on 09/01/2016 revealing a moderately decreased left ventricular systolic function with an EF of 34%.  EF felt to be depressed secondary to GI uptake artifact.  There was no evidence of stress-induced myocardial ischemia or  arrhythmia; no scintigraphic evidence of scar.  Study determined to be low risk.  Diagnostic LEFT heart catheterization performed on 01/28/2018 revealed 20% in-stent restenosis of the ostial-mid RCA and 20% in-stent restenosis of the mid RCA.  Given the nonobstructive nature of the patient's coronary artery disease, interventional cardiology made the decision to defer further intervention opting for medical management.  Patient suffered an anterior wall STEMI on 02/13/2019.    TTE was performed on 02/13/2019 revealing a mildly reduced left ventricular systolic function with an EF of 40-45%.  Apical hypokinesis noted. There was trivial tricuspid valve regurgitation.  PASP elevated at 34.2 mmHg.  Transvalvular gradients were noted to be normal providing no evidence suggestive of valvular stenosis.  Aorta was normal in size with no evidence of aneurysmal dilatation.  Diagnostic LEFT heart catheterization revealing 70% stenosis of the mid to distal LAD.  SCAD noted to the mid LAD, and felt to be the etiology of patient's coronary event.  Coronary stents in the patient's ostial, proximal, and mid RCA is all noted to be widely patent.  No further intervention was deemed necessary at that time.  Given patient's significant cardiovascular history and previous BMS placement, patient remains on daily DAPT therapy (ASA + clopidogrel).  Patient reported be compliant with therapy with no evidence or reports of GI bleeding. Blood pressure well controlled at 110/72 mmHg on currently prescribed nitrate (isosorbide mononitrate) monotherapy.  Patient not currently taking any type of lipid-lowering therapies for her HLD diagnosis or further ASCVD prevention.  T2DM well-controlled on currently prescribed regimen; last HgbA1c at the time of her cardiology visit was 6.2% when checked on 10/19/2021.  Of note, A1c has been rechecked since last seen by cardiology, with improvement noted to 6.1% on 08/02/2022. Functional capacity  is somewhat limited by  patient's age and multiple medical comorbidities.  Patient with exertional dyspnea, chronic cough, and fatigue. With that said, patient is able to complete all of her ADL/IADLs without cardiovascular limitation.  Per the DASI, patient is able to achieve at least 4 METS of physical activity without experiencing any significant degree of angina/anginal equivalent symptoms.  No changes were made to her medication regimen.  Patient to follow-up with outpatient cardiology in 3 months or sooner if needed.  Tonantzin A Lohn is scheduled for an elective LEFT KNEE ARTHROSCOPY WITH DEBRIDEMENT, PARTIAL MEDIAL AND LATERAL MENISCECTOMIES, AND REINJECTION OF HARVESTED INFRAPATELLAR FAT CELLS. (Left: Knee) on 10/19/2022 with Dr. Leron Croak, MD.  Given patient's past medical history significant for cardiovascular diagnoses, presurgical cardiac clearance was sought by the PAT team. Per cardiology, "this patient is optimized for surgery and may proceed with the planned procedural course with a LOW risk of significant perioperative cardiovascular complications".   Again, this patient is on daily DAPT therapy.  She has been instructed on recommendations for holding her clopidogrel for 5 days prior to her procedure with plans to restart as soon as postoperative bleeding risk felt to be minimized by her attending surgeon. The patient has been instructed that her last dose of her clopidogrel should be on 10/13/2022.  Given her cardiovascular history, patient will continue her daily low-dose ASA throughout her perioperative course.  Patient reports previous perioperative complications with anesthesia in the past. Patient has a PMH (+) for PONV. Symptoms and history of PONV will be discussed with patient by anesthesia team on the day of her procedure. Interventions will be ordered as deemed necessary based on patient's individual care needs as determined by anesthesiologist. In review of  the available  records, it is noted that patient underwent a MAC anesthetic course at [facility] (ASA III) in 04/2021 without documented complications.      10/10/2022    1:22 PM 08/15/2022    1:22 PM 07/25/2022   10:38 AM  Vitals with BMI  Height 5\' 2"   5\' 2"   Weight 188 lbs 188 lbs 3 oz 191 lbs  BMI 34.38 34.41 34.93  Systolic 107 114 161  Diastolic 70 73 76  Pulse 65 75 61    Providers/Specialists:   NOTE: Primary physician provider listed below. Patient may have been seen by APP or partner within same practice.   PROVIDER ROLE / SPECIALTY LAST OV  Poggi, Excell Seltzer, MD Orthopedics (Surgeon) 09/25/2022  Jerl Mina, MD Primary Care Provider 08/02/2022  Marcina Millard, MD Cardiology 01/19/2022  Ned Clines, MD Pulmonary Medicine 06/23/2022   Allergies:  Amoxicillin and Atorvastatin  Current Home Medications:   No current facility-administered medications for this encounter.    acetaminophen (TYLENOL) 500 MG tablet   ALPRAZolam (XANAX XR) 1 MG 24 hr tablet   aspirin 81 MG tablet   baclofen (LIORESAL) 10 MG tablet   clopidogrel (PLAVIX) 75 MG tablet   cyclobenzaprine (FLEXERIL) 5 MG tablet   DULoxetine (CYMBALTA) 20 MG capsule   DULoxetine (CYMBALTA) 20 MG capsule   gabapentin (NEURONTIN) 300 MG capsule   isosorbide mononitrate (IMDUR) 60 MG 24 hr tablet   montelukast (SINGULAIR) 10 MG tablet   nitroGLYCERIN (NITROSTAT) 0.4 MG SL tablet   rOPINIRole (REQUIP) 0.25 MG tablet   History:   Past Medical History:  Diagnosis Date   Acute ST elevation myocardial infarction (STEMI) of anterior wall (HCC) 02/13/2019   a.) LHC 02/13/2019: 70% m-d LAD. Patent ostial/proximal/mid RCA stents. SCAD mLAD --> medical mgmt   Anxiety    a.) on BZO PRN (alprazolam)   Asthma    Baker's cyst, left    Bronchitis    Chronic cough    Complex tear of medial meniscus of left knee as current injury    Complication of anesthesia    a.) PONV   COPD (chronic obstructive pulmonary  disease) (HCC)    Coronary artery disease 01/16/2004   a.) LHC/PCI 01/13/2004: 70% oRCA (3.5x8 mm Vision BMS), 50% pRCA 3.5x23 mm Cypher DES), 90% mRCA (3.5x23 mm Cypher DES); b.) LHC/PCI 03/24/2004: 95% ISR pRCA (3.5x18 Cypher DES); c.) LHC 05/26/2008: pat stents - med mgmt; d.) LHC 01/18/2011: pat stents - med mgmt; e.) LHC 01/28/2018: 20% ISR o-mRCA, 20% ISR mRCA - med mgmt; f.) LHC 02/13/2019: 70% m-dLAD. Pat RCA stents. SCAD mLAD -- med mgmt   Degenerative tear of lateral meniscus, left    Depression    Diet-controlled type 2 diabetes mellitus (HCC) 05/2008   Diverticulosis    Dyspnea    Endometriosis    Environmental and seasonal allergies    History of bilateral cataract extraction 2023   HLD (hyperlipidemia)    Hypertension    Long term current use of aspirin    Long term current use of clopidogrel    Lower extremity edema    Migraine headache    Palpitation    Pes anserine bursitis    Pneumonia    PONV (postoperative nausea and vomiting)    Primary osteoarthritis of left knee    RLS (restless legs syndrome)    Spontaneous dissection of coronary artery 02/13/2019   a.) LHC 02/13/2019 in setting of anterior STEMI --> medical mgmt   Tremors of  nervous system    Unstable angina (HCC)    Varicose veins of both lower extremities    a.) s/p GSV laser ablation; b.) s/p foam sclerotherapy   Wears dentures    Partial upper   Wheezing    Past Surgical History:  Procedure Laterality Date   ABDOMINAL HYSTERECTOMY     BARIATRIC SURGERY     CATARACT EXTRACTION W/PHACO Left 03/30/2021   Procedure: CATARACT EXTRACTION PHACO AND INTRAOCULAR LENS PLACEMENT (IOC) LEFT DIABETIC;  Surgeon: Lockie Mola, MD;  Location: [facility];  Service: Ophthalmology;  Laterality: Left;  3.39 0:31.6   CATARACT EXTRACTION W/PHACO Right 04/13/2021   Procedure: CATARACT EXTRACTION PHACO AND INTRAOCULAR LENS PLACEMENT (IOC) RIGHT DIABETIC;  Surgeon: Lockie Mola, MD;  Location:  [facility];  Service: Ophthalmology;  Laterality: Right;  3.31 00:42.0   CHOLECYSTECTOMY     COLON SURGERY     COLONOSCOPY     CORONARY ANGIOPLASTY WITH STENT PLACEMENT Left 01/13/2004   Procedure: CORONARY ANGIOPLASTY WITH STENT PLACEMENT; Location: [facility]; Surgeon: Marcina Millard, MD   CORONARY ANGIOPLASTY WITH STENT PLACEMENT Left 03/24/2004   Procedure: CORONARY ANGIOPLASTY WITH STENT PLACEMENT; Location: [facility]; Surgeon: Marcina Millard, MD   LEFT HEART CATH AND CORONARY ANGIOGRAPHY N/A 01/28/2018   Procedure: LEFT HEART CATH AND CORONARY ANGIOGRAPHY;  Surgeon: Dalia Heading, MD;  Location: [facility];  Service: Cardiovascular;  Laterality: N/A;   LEFT HEART CATH AND CORONARY ANGIOGRAPHY N/A 02/13/2019   Procedure: LEFT HEART CATH AND CORONARY ANGIOGRAPHY;  Surgeon: Marcina Millard, MD;  Location: [facility];  Service: Cardiovascular;  Laterality: N/A;   LEFT HEART CATH AND CORONARY ANGIOGRAPHY Left 05/26/2008   Procedure: LEFT HEART CATH AND CORONARY ANGIOGRAPHY; Location: [facility]; Surgeon: Marcina Millard, MD   LEFT HEART CATH AND CORONARY ANGIOGRAPHY Left 01/18/2011   Procedure: LEFT HEART CATH AND CORONARY ANGIOGRAPHY; Location: [facility]; Surgeon: Marcina Millard, MD   Family History  Problem Relation Age of Onset   Hypertension Mother    Heart disease Father    CAD Father    Heart disease Sister    Stroke Sister    Breast cancer Neg Hx    Social History   Tobacco Use   Smoking status: Never   Smokeless tobacco: Never  Vaping Use   Vaping status: Never Used  Substance Use Topics   Alcohol use: No   Drug use: No    Pertinent Clinical Results:  LABS:   No visits with results within 3 Day(s) from this visit.  Latest known visit with results is:  Admission on 01/17/2022, Discharged on 01/17/2022  Component Date Value Ref Range Status   Sodium 01/17/2022 138  135 - 145 mmol/L Final   Potassium 01/17/2022 3.9  3.5 - 5.1  mmol/L Final   Chloride 01/17/2022 106  98 - 111 mmol/L Final   CO2 01/17/2022 24  22 - 32 mmol/L Final   Glucose, Bld 01/17/2022 178 (H)  70 - 99 mg/dL Final   Glucose reference range applies only to samples taken after fasting for at least 8 hours.   BUN 01/17/2022 14  8 - 23 mg/dL Final   Creatinine, Ser 01/17/2022 0.75  0.44 - 1.00 mg/dL Final   Calcium 02/72/5366 9.0  8.9 - 10.3 mg/dL Final   GFR, Estimated 01/17/2022 >60  >60 mL/min Final   Comment: (NOTE) Calculated using the CKD-EPI Creatinine Equation (2021)    Anion gap 01/17/2022 8  5 - 15 Final   Performed at [facility], [address] [place], [place] [address]   WBC 01/17/2022 5.2  4.0 - 10.5 K/uL Final   RBC 01/17/2022 4.21  3.87 - 5.11 MIL/uL Final   Hemoglobin 01/17/2022 11.5 (L)  12.0 - 15.0 g/dL Final   HCT 40/98/1191 36.8  36.0 - 46.0 % Final   MCV 01/17/2022 87.4  80.0 - 100.0 fL Final   MCH 01/17/2022 27.3  26.0 - 34.0 pg Final   MCHC 01/17/2022 31.3  30.0 - 36.0 g/dL Final   RDW 47/82/9562 13.8  11.5 - 15.5 % Final   Platelets 01/17/2022 256  150 - 400 K/uL Final   nRBC 01/17/2022 0.0  0.0 - 0.2 % Final   Performed at [facility], [address] [place], [place] [address]   Troponin I (High Sensitivity) 01/17/2022 6  <18 ng/L Final   Comment: (NOTE) Elevated high sensitivity troponin I (hsTnI) values and significant  changes across serial measurements may suggest ACS but many other  chronic and acute conditions are known to elevate hsTnI results.  Refer to the "Links" section for chest pain algorithms and additional  guidance. Performed at [facility], [address] [place], [place] [address]    B Natriuretic Peptide 01/17/2022 139.9 (H)  0.0 - 100.0 pg/mL Final   Performed at [facility], [address] [place], [place] [address]   Troponin I (High Sensitivity) 01/17/2022 7  <18 ng/L Final   Comment: (NOTE) Elevated high sensitivity troponin I (hsTnI)  values and significant  changes across serial measurements may suggest ACS but many other  chronic and acute conditions are known to elevate hsTnI results.  Refer to the "Links" section for chest pain algorithms and additional  guidance. Performed at [facility], [address] [place], [place] [address]     ECG: Date: 10/12/2022 Time ECG obtained: 0832 AM Rate: 60 bpm Rhythm: normal sinus Axis (leads I and aVF): Normal Intervals: PR 150 ms. QRS 76 ms. QTc 460 ms. ST segment and T wave changes: No evidence of acute ST segment elevation or depression.  Comparison: Similar to previous tracing obtained on 01/17/2022   IMAGING / PROCEDURES: MR KNEE LEFT WO CONTRAST performed on 09/16/2022 Complex tear posterior horn and body of the medial meniscus. Marked degeneration anterior horn lateral meniscus. Severe mucoid degeneration of the ACL. Mild to moderate osteoarthritis Pes anserine bursitis. Baker's cyst.  PULMONARY FUNCTION TESTING performed on 02/21/2022 SPIROMETRY: FVC was 2.11 liters, 78% of predicted FEV1 was 1.75, 81% of predicted FEV1 ratio was 82.75 FEF 25-75% liters per second was 89% of predicted LUNG VOLUMES: TLC was 94% of predicted RV was 136% of predicted DIFFUSION CAPACITY: DLCO was 88% of predicted DLCO/VA was 122% of predicted FLOW VOLUME LOOP: Normal IMPRESSION Spirometry overall is in normal range, flow volume on spirometry trending down - following  Lung volumes and diffusion capacity in normal range  TRANSTHORACIC ECHOCARDIOGRAM performed on 05/31/2018 Left ventricular ejection fraction, by visual estimation, is 40 to 45%. The left ventricle has mildly decreased function. Left ventricular septal wall thickness was normal. Normal left ventricular posterior wall thickness. There is no left ventricular hypertrophy.  Global right ventricle has normal systolic function.The right ventricular size is normal. No increase in right ventricular wall  thickness.  Left atrial size was not well visualized.  Right atrial size was normal.  The pericardium was not well visualized.  The mitral valve is normal in structure. No evidence of mitral valve regurgitation. The tricuspid valve  is not well visualized. Tricuspid valve regurgitation is not demonstrated.  The aortic valve is normal in structure. Aortic valve regurgitation is not visualized.  The pulmonic valve was grossly normal. Pulmonic valve regurgitation is not visualized.  Mildly elevated pulmonary artery systolic pressure of 32.4 mmHg Apical hypokinesis.  The interatrial septum was not assessed.   LEFT HEART CATHETERIZATION AND CORONARY ANGIOGRAPHY performed on 05/31/2018 Mildly reduced left ventricular systolic function with an EF of 45% With apical dyskinesis Coronary artery disease 70% mid to distal LAD Previously placed stents Ostial to proximal RCA stent widely patent Proximal RCA stent widely patent Mid RCA stent widely patent Recommendations Medical therapy DAPT 2D echocardiogram Resume metoprolol succinate and isosorbide mononitrate Resume simvastatin    MYOCARDIAL PERFUSION IMAGING STUDY (LEXISCAN) performed on 09/01/2016 Moderately reduced left ventricular systolic function with an EF of 34%.  EF depressed secondary to GI uptake artifact. No regional wall motion abnormalities No evidence of stress-induced myocardial ischemia or arrhythmias; no sonographic evidence of scar Low risk scan  Impression and Plan:  Bettymae A Hiltz has been referred for pre-anesthesia review and clearance prior to her undergoing the planned anesthetic and procedural courses. Available labs, pertinent testing, and imaging results were personally reviewed by me in preparation for upcoming operative/procedural course. [facility] medical record has been updated following extensive record review and patient interview with PAT staff.   This patient has been appropriately cleared by  cardiology with an overall LOW risk of experiencing significant perioperative cardiovascular complications. Based on clinical review performed today (10/17/22), barring any significant acute changes in the patient's overall condition, it is anticipated that she will be able to proceed with the planned surgical intervention. Any acute changes in clinical condition may necessitate her procedure being postponed and/or cancelled. Patient will meet with anesthesia team (MD and/or CRNA) on the day of her procedure for preoperative evaluation/assessment. Questions regarding anesthetic course will be fielded at that time.   Pre-surgical instructions were reviewed with the patient during her PAT appointment, and questions were fielded to satisfaction by PAT clinical staff. She has been instructed on which medications that she will need to hold prior to surgery, as well as the ones that have been deemed safe/appropriate to take on the day of her procedure. As part of the general education provided by PAT, patient made aware both verbally and in writing, that she would need to abstain from the use of any illegal substances during her perioperative course.  She was advised that failure to follow the provided instructions could necessitate case cancellation or result in serious perioperative complications up to and including death. Patient encouraged to contact PAT and/or her surgeon's office to discuss any questions or concerns that may arise prior to surgery; verbalized understanding.   Quentin Mulling, MSN, APRN, FNP-C, CEN [facility]  Peri-operative Services Nurse Practitioner Phone: [phone] Fax: [phone] 10/17/22 9:41 AM  NOTE: This note has been prepared using Dragon dictation software. Despite my best ability to proofread, there is always the potential that unintentional transcriptional errors may still occur from this process.

## 2022-10-19 ENCOUNTER — Other Ambulatory Visit: Payer: Self-pay

## 2022-10-19 ENCOUNTER — Encounter
Admission: RE | Disposition: A | Discharge status: Home / Self Care | Payer: Self-pay | Source: Home / Self Care | Attending: Surgery

## 2022-10-19 ENCOUNTER — Ambulatory Visit: Payer: Medicare HMO | Admitting: Urgent Care

## 2022-10-19 ENCOUNTER — Ambulatory Visit
Admission: RE | Admit: 2022-10-19 | Discharge: 2022-10-19 | Disposition: A | Discharge status: Home / Self Care | Payer: Medicare HMO | Attending: Surgery | Admitting: Surgery

## 2022-10-19 DIAGNOSIS — F32A Depression, unspecified: Secondary | ICD-10-CM | POA: Diagnosis not present

## 2022-10-19 DIAGNOSIS — M23222 Derangement of posterior horn of medial meniscus due to old tear or injury, left knee: Secondary | ICD-10-CM | POA: Diagnosis not present

## 2022-10-19 DIAGNOSIS — J449 Chronic obstructive pulmonary disease, unspecified: Secondary | ICD-10-CM | POA: Insufficient documentation

## 2022-10-19 DIAGNOSIS — I110 Hypertensive heart disease with heart failure: Secondary | ICD-10-CM | POA: Diagnosis not present

## 2022-10-19 DIAGNOSIS — I509 Heart failure, unspecified: Secondary | ICD-10-CM | POA: Diagnosis not present

## 2022-10-19 DIAGNOSIS — Z955 Presence of coronary angioplasty implant and graft: Secondary | ICD-10-CM | POA: Insufficient documentation

## 2022-10-19 DIAGNOSIS — F17200 Nicotine dependence, unspecified, uncomplicated: Secondary | ICD-10-CM | POA: Diagnosis not present

## 2022-10-19 DIAGNOSIS — E119 Type 2 diabetes mellitus without complications: Secondary | ICD-10-CM | POA: Insufficient documentation

## 2022-10-19 DIAGNOSIS — I2510 Atherosclerotic heart disease of native coronary artery without angina pectoris: Secondary | ICD-10-CM | POA: Insufficient documentation

## 2022-10-19 DIAGNOSIS — S83232A Complex tear of medial meniscus, current injury, left knee, initial encounter: Secondary | ICD-10-CM | POA: Diagnosis not present

## 2022-10-19 DIAGNOSIS — I25110 Atherosclerotic heart disease of native coronary artery with unstable angina pectoris: Secondary | ICD-10-CM | POA: Diagnosis not present

## 2022-10-19 DIAGNOSIS — I252 Old myocardial infarction: Secondary | ICD-10-CM | POA: Insufficient documentation

## 2022-10-19 DIAGNOSIS — M23242 Derangement of anterior horn of lateral meniscus due to old tear or injury, left knee: Secondary | ICD-10-CM | POA: Diagnosis not present

## 2022-10-19 DIAGNOSIS — M23301 Other meniscus derangements, unspecified lateral meniscus, left knee: Secondary | ICD-10-CM | POA: Diagnosis not present

## 2022-10-19 DIAGNOSIS — F419 Anxiety disorder, unspecified: Secondary | ICD-10-CM | POA: Diagnosis not present

## 2022-10-19 DIAGNOSIS — M1712 Unilateral primary osteoarthritis, left knee: Secondary | ICD-10-CM | POA: Insufficient documentation

## 2022-10-19 HISTORY — PX: KNEE ARTHROSCOPY: SHX127

## 2022-10-19 HISTORY — DX: Long term (current) use of antithrombotics/antiplatelets: Z79.02

## 2022-10-19 HISTORY — DX: Asymptomatic varicose veins of bilateral lower extremities: I83.93

## 2022-10-19 HISTORY — DX: Unstable angina: I20.0

## 2022-10-19 HISTORY — DX: Synovial cyst of popliteal space (Baker), left knee: M71.22

## 2022-10-19 HISTORY — DX: Palpitations: R00.2

## 2022-10-19 HISTORY — DX: Endometriosis, unspecified: N80.9

## 2022-10-19 HISTORY — DX: Other bursitis of knee, unspecified knee: M70.50

## 2022-10-19 HISTORY — DX: Restless legs syndrome: G25.81

## 2022-10-19 HISTORY — DX: Hyperlipidemia, unspecified: E78.5

## 2022-10-19 HISTORY — DX: Long term (current) use of aspirin: Z79.82

## 2022-10-19 LAB — GLUCOSE, CAPILLARY
Glucose-Capillary: 87 mg/dL (ref 70–99)
Glucose-Capillary: 109 mg/dL — ABNORMAL HIGH (ref 70–99)

## 2022-10-19 SURGERY — ARTHROSCOPY, KNEE
Anesthesia: General | Site: Knee | Laterality: Left

## 2022-10-19 MED ORDER — PHENYLEPHRINE 80 MCG/ML (10ML) SYRINGE FOR IV PUSH (FOR BLOOD PRESSURE SUPPORT)
PREFILLED_SYRINGE | INTRAVENOUS | Status: AC
  Filled 2022-10-19: qty 10

## 2022-10-19 MED ORDER — FENTANYL CITRATE (PF) 100 MCG/2ML IJ SOLN
INTRAMUSCULAR | Status: AC
  Filled 2022-10-19: qty 2

## 2022-10-19 MED ORDER — CHLORHEXIDINE GLUCONATE 0.12 % MT SOLN
OROMUCOSAL | Status: AC
  Filled 2022-10-19: qty 15

## 2022-10-19 MED ORDER — LIDOCAINE HCL (PF) 2 % IJ SOLN
INTRAMUSCULAR | Status: AC
  Filled 2022-10-19: qty 5

## 2022-10-19 MED ORDER — ONDANSETRON HCL 4 MG/2ML IJ SOLN: 4.0000 mg | Freq: Four times a day (QID) | INTRAMUSCULAR | Status: DC | PRN

## 2022-10-19 MED ORDER — IPRATROPIUM-ALBUTEROL 0.5-2.5 (3) MG/3ML IN SOLN
RESPIRATORY_TRACT | Status: AC
  Filled 2022-10-19: qty 3

## 2022-10-19 MED ORDER — ACETAMINOPHEN 325 MG PO TABS: 325.0000 mg | ORAL_TABLET | Freq: Four times a day (QID) | ORAL | Status: DC | PRN

## 2022-10-19 MED ORDER — PHENYLEPHRINE HCL-NACL 20-0.9 MG/250ML-% IV SOLN
INTRAVENOUS | Status: DC | PRN
  Administered 2022-10-19: 40 ug/min via INTRAVENOUS

## 2022-10-19 MED ORDER — BUPIVACAINE HCL (PF) 0.5 % IJ SOLN
INTRAMUSCULAR | Status: AC
  Filled 2022-10-19: qty 30

## 2022-10-19 MED ORDER — ACETAMINOPHEN 10 MG/ML IV SOLN
INTRAVENOUS | Status: AC
  Filled 2022-10-19: qty 100

## 2022-10-19 MED ORDER — ISOSORBIDE MONONITRATE ER 60 MG PO TB24: 30.0000 mg | ORAL_TABLET | Freq: Two times a day (BID) | ORAL | Status: AC

## 2022-10-19 MED ORDER — ONDANSETRON HCL 4 MG PO TABS: 4.0000 mg | ORAL_TABLET | Freq: Four times a day (QID) | ORAL | Status: DC | PRN

## 2022-10-19 MED ORDER — MIDAZOLAM HCL 2 MG/2ML IJ SOLN
INTRAMUSCULAR | Status: AC
  Filled 2022-10-19: qty 2

## 2022-10-19 MED ORDER — PHENYLEPHRINE HCL (PRESSORS) 10 MG/ML IV SOLN
INTRAVENOUS | Status: AC
  Filled 2022-10-19: qty 1

## 2022-10-19 MED ORDER — METOCLOPRAMIDE HCL 5 MG/ML IJ SOLN: 5.0000 mg | Freq: Three times a day (TID) | INTRAMUSCULAR | Status: DC | PRN

## 2022-10-19 MED ORDER — FAMOTIDINE 20 MG PO TABS
ORAL_TABLET | ORAL | Status: AC
  Filled 2022-10-19: qty 1

## 2022-10-19 MED ORDER — GLYCOPYRROLATE 0.2 MG/ML IJ SOLN
INTRAMUSCULAR | Status: AC
  Filled 2022-10-19: qty 1

## 2022-10-19 MED ORDER — CEFAZOLIN SODIUM-DEXTROSE 2-4 GM/100ML-% IV SOLN
INTRAVENOUS | Status: AC
  Filled 2022-10-19: qty 100

## 2022-10-19 MED ORDER — PROPOFOL 10 MG/ML IV BOLUS
INTRAVENOUS | Status: AC
  Filled 2022-10-19: qty 20

## 2022-10-19 MED ORDER — CHLORHEXIDINE GLUCONATE 0.12 % MT SOLN
15.0000 mL | Freq: Once | OROMUCOSAL | Status: AC
  Administered 2022-10-19: 15 mL via OROMUCOSAL

## 2022-10-19 MED ORDER — KETOROLAC TROMETHAMINE 15 MG/ML IJ SOLN: 15.0000 mg | Freq: Once | INTRAMUSCULAR | Status: DC

## 2022-10-19 MED ORDER — ATROPINE SULFATE 0.4 MG/ML IV SOLN
INTRAVENOUS | Status: AC
  Filled 2022-10-19: qty 1

## 2022-10-19 MED ORDER — OXYCODONE HCL 5 MG PO TABS: 5.0000 mg | ORAL_TABLET | ORAL | 0 refills | Status: AC | PRN

## 2022-10-19 MED ORDER — FENTANYL CITRATE (PF) 100 MCG/2ML IJ SOLN
INTRAMUSCULAR | Status: DC | PRN
  Administered 2022-10-19 (×2): 50 ug via INTRAVENOUS

## 2022-10-19 MED ORDER — FAMOTIDINE 20 MG PO TABS
20.0000 mg | ORAL_TABLET | Freq: Once | ORAL | Status: AC
  Administered 2022-10-19: 20 mg via ORAL

## 2022-10-19 MED ORDER — SODIUM CHLORIDE 0.9 % IV SOLN: INTRAVENOUS | Status: DC

## 2022-10-19 MED ORDER — EPHEDRINE SULFATE (PRESSORS) 50 MG/ML IJ SOLN
INTRAMUSCULAR | Status: DC | PRN
  Administered 2022-10-19 (×2): 5 mg via INTRAVENOUS

## 2022-10-19 MED ORDER — PROPOFOL 10 MG/ML IV BOLUS
INTRAVENOUS | Status: DC | PRN
  Administered 2022-10-19: 120 ug/kg/min via INTRAVENOUS
  Administered 2022-10-19: 150 mg via INTRAVENOUS

## 2022-10-19 MED ORDER — GLYCOPYRROLATE 0.2 MG/ML IJ SOLN
INTRAMUSCULAR | Status: DC | PRN
  Administered 2022-10-19: .2 mg via INTRAVENOUS

## 2022-10-19 MED ORDER — LIDOCAINE HCL (CARDIAC) PF 100 MG/5ML IV SOSY
PREFILLED_SYRINGE | INTRAVENOUS | Status: DC | PRN
  Administered 2022-10-19: 80 mg via INTRAVENOUS

## 2022-10-19 MED ORDER — LIDOCAINE HCL (PF) 1 % IJ SOLN
INTRAMUSCULAR | Status: DC | PRN
  Administered 2022-10-19: 60 mL

## 2022-10-19 MED ORDER — ATROPINE SULFATE 0.4 MG/ML IV SOLN
INTRAVENOUS | Status: DC | PRN
  Administered 2022-10-19: .4 mg via INTRAVENOUS

## 2022-10-19 MED ORDER — CEFAZOLIN SODIUM-DEXTROSE 2-4 GM/100ML-% IV SOLN
2.0000 g | Freq: Once | INTRAVENOUS | Status: AC
  Administered 2022-10-19: 2 g via INTRAVENOUS

## 2022-10-19 MED ORDER — IPRATROPIUM-ALBUTEROL 0.5-2.5 (3) MG/3ML IN SOLN
3.0000 mL | Freq: Once | RESPIRATORY_TRACT | Status: AC
  Administered 2022-10-19: 3 mL via RESPIRATORY_TRACT

## 2022-10-19 MED ORDER — OXYCODONE HCL 5 MG/5ML PO SOLN: 5.0000 mg | Freq: Once | ORAL | Status: DC | PRN

## 2022-10-19 MED ORDER — ONDANSETRON HCL 4 MG/2ML IJ SOLN
INTRAMUSCULAR | Status: DC | PRN
  Administered 2022-10-19: 4 mg via INTRAVENOUS

## 2022-10-19 MED ORDER — LIDOCAINE HCL (PF) 1 % IJ SOLN
INTRAMUSCULAR | Status: AC
  Filled 2022-10-19: qty 30

## 2022-10-19 MED ORDER — METOCLOPRAMIDE HCL 10 MG PO TABS: 5.0000 mg | ORAL_TABLET | Freq: Three times a day (TID) | ORAL | Status: DC | PRN

## 2022-10-19 MED ORDER — DEXAMETHASONE SODIUM PHOSPHATE 10 MG/ML IJ SOLN
INTRAMUSCULAR | Status: AC
  Filled 2022-10-19: qty 1

## 2022-10-19 MED ORDER — OXYCODONE HCL 5 MG PO TABS: 5.0000 mg | ORAL_TABLET | Freq: Once | ORAL | Status: DC | PRN

## 2022-10-19 MED ORDER — DEXAMETHASONE SODIUM PHOSPHATE 10 MG/ML IJ SOLN
INTRAMUSCULAR | Status: DC | PRN
  Administered 2022-10-19: 10 mg via INTRAVENOUS

## 2022-10-19 MED ORDER — BUPIVACAINE-EPINEPHRINE (PF) 0.5% -1:200000 IJ SOLN
INTRAMUSCULAR | Status: DC | PRN
  Administered 2022-10-19: 30 mL

## 2022-10-19 MED ORDER — RINGERS IRRIGATION IR SOLN
Status: DC | PRN
  Administered 2022-10-19: 3000 mL

## 2022-10-19 MED ORDER — OXYCODONE HCL 5 MG PO TABS: 5.0000 mg | ORAL_TABLET | ORAL | Status: DC | PRN

## 2022-10-19 MED ORDER — BUPIVACAINE-EPINEPHRINE (PF) 0.5% -1:200000 IJ SOLN
INTRAMUSCULAR | Status: AC
  Filled 2022-10-19: qty 30

## 2022-10-19 MED ORDER — MIDAZOLAM HCL 2 MG/2ML IJ SOLN
INTRAMUSCULAR | Status: DC | PRN
  Administered 2022-10-19: 2 mg via INTRAVENOUS

## 2022-10-19 MED ORDER — EPINEPHRINE PF 1 MG/ML IJ SOLN
INTRAMUSCULAR | Status: AC
  Filled 2022-10-19: qty 1

## 2022-10-19 MED ORDER — VANCOMYCIN HCL IN DEXTROSE 1-5 GM/200ML-% IV SOLN
1000.0000 mg | Freq: Once | INTRAVENOUS | Status: AC
  Administered 2022-10-19: 1000 mg via INTRAVENOUS

## 2022-10-19 MED ORDER — ONDANSETRON HCL 4 MG/2ML IJ SOLN
INTRAMUSCULAR | Status: AC
  Filled 2022-10-19: qty 2

## 2022-10-19 MED ORDER — ACETAMINOPHEN 10 MG/ML IV SOLN
INTRAVENOUS | Status: DC | PRN
  Administered 2022-10-19: 1000 mg via INTRAVENOUS

## 2022-10-19 MED ORDER — FENTANYL CITRATE (PF) 100 MCG/2ML IJ SOLN: 25.0000 ug | INTRAMUSCULAR | Status: DC | PRN

## 2022-10-19 MED ORDER — ONDANSETRON HCL 4 MG/2ML IJ SOLN: 4.0000 mg | Freq: Once | INTRAMUSCULAR | Status: DC | PRN

## 2022-10-19 MED ORDER — ORAL CARE MOUTH RINSE: 15.0000 mL | Freq: Once | OROMUCOSAL | Status: AC

## 2022-10-19 MED ORDER — EPHEDRINE 5 MG/ML INJ
INTRAVENOUS | Status: AC
  Filled 2022-10-19: qty 5

## 2022-10-19 MED ORDER — PHENYLEPHRINE HCL (PRESSORS) 10 MG/ML IV SOLN
INTRAVENOUS | Status: DC | PRN
  Administered 2022-10-19: 80 ug via INTRAVENOUS
  Administered 2022-10-19: 40 ug via INTRAVENOUS
  Administered 2022-10-19 (×2): 80 ug via INTRAVENOUS
  Administered 2022-10-19 (×2): 40 ug via INTRAVENOUS

## 2022-10-19 SURGICAL SUPPLY — 39 items
APL PRP STRL LF DISP 70% ISPRP (MISCELLANEOUS) ×1
BLADE FULL RADIUS 3.5 (BLADE) ×1 IMPLANT
BLADE SHAVER 4.5X7 STR FR (MISCELLANEOUS) ×1 IMPLANT
BNDG CMPR 6 X 5 YARDS HK CLSR (GAUZE/BANDAGES/DRESSINGS) ×1
BNDG ELASTIC 6INX 5YD STR LF (GAUZE/BANDAGES/DRESSINGS) ×1 IMPLANT
BNDG ESMARCH 6 X 12 STRL LF (GAUZE/BANDAGES/DRESSINGS) ×1
BNDG ESMARCH 6X12 STRL LF (GAUZE/BANDAGES/DRESSINGS) ×1 IMPLANT
CATH ROBINSON RED A/P 12FR (CATHETERS) IMPLANT
CHLORAPREP W/TINT 26 (MISCELLANEOUS) ×1 IMPLANT
COLLECTOR GRAFT TISSUE (SYSTAGENIX WOUND MANAGEMENT) ×1
CUFF TOURN SGL QUICK 34 (TOURNIQUET CUFF) ×1
CUFF TRNQT CYL 34X4.125X (TOURNIQUET CUFF) IMPLANT
CUP MEDICINE 2OZ PLAST GRAD ST (MISCELLANEOUS) ×1 IMPLANT
DRAPE ARTHRO LIMB 89X125 STRL (DRAPES) ×1 IMPLANT
DRAPE IMP U-DRAPE 54X76 (DRAPES) ×1 IMPLANT
ELECT REM PT RETURN 9FT ADLT (ELECTROSURGICAL) ×1
ELECTRODE REM PT RTRN 9FT ADLT (ELECTROSURGICAL) ×1 IMPLANT
GAUZE SPONGE 4X4 12PLY STRL (GAUZE/BANDAGES/DRESSINGS) ×1 IMPLANT
GLOVE BIO SURGEON STRL SZ8 (GLOVE) ×2 IMPLANT
GLOVE INDICATOR 8.0 STRL GRN (GLOVE) ×1 IMPLANT
GOWN STRL REUS W/ TWL LRG LVL3 (GOWN DISPOSABLE) ×1 IMPLANT
GOWN STRL REUS W/ TWL XL LVL3 (GOWN DISPOSABLE) ×2 IMPLANT
GOWN STRL REUS W/TWL LRG LVL3 (GOWN DISPOSABLE) ×1
GOWN STRL REUS W/TWL XL LVL3 (GOWN DISPOSABLE) ×2
IV LACTATED RINGER IRRG 3000ML (IV SOLUTION) ×1
IV LR IRRIG 3000ML ARTHROMATIC (IV SOLUTION) ×1 IMPLANT
KIT TURNOVER KIT A (KITS) ×1 IMPLANT
MANIFOLD NEPTUNE II (INSTRUMENTS) ×2 IMPLANT
NDL HYPO 21X1.5 SAFETY (NEEDLE) ×1 IMPLANT
NEEDLE HYPO 21X1.5 SAFETY (NEEDLE) ×1 IMPLANT
PACK ARTHROSCOPY KNEE (MISCELLANEOUS) ×1 IMPLANT
SUT PROLENE 4 0 PS 2 18 (SUTURE) ×1 IMPLANT
SYR 30ML LL (SYRINGE) ×1 IMPLANT
SYR 50ML LL SCALE MARK (SYRINGE) ×1 IMPLANT
SYR TOOMEY 50ML (SYRINGE) IMPLANT
TISSUE GRAFT COLLECTOR (SYSTAGENIX WOUND MANAGEMENT) IMPLANT
TRAP FLUID SMOKE EVACUATOR (MISCELLANEOUS) ×1 IMPLANT
TUBE SET DOUBLEFLO INFLOW (TUBING) ×1 IMPLANT
WAND WEREWOLF FLOW 90D (MISCELLANEOUS) ×1 IMPLANT

## 2022-10-19 NOTE — Anesthesia Procedure Notes (Signed)
Procedure Name: LMA Insertion Date/Time: 10/19/2022 1:49 PM  Performed by: Morene Crocker, CRNAPre-anesthesia Checklist: Patient identified, Patient being monitored, Timeout performed, Emergency Drugs available and Suction available Patient Re-evaluated:Patient Re-evaluated prior to induction Oxygen Delivery Method: Circle system utilized Preoxygenation: Pre-oxygenation with 100% oxygen Induction Type: IV induction Ventilation: Mask ventilation without difficulty LMA: LMA inserted LMA Size: 3.0 Tube type: Oral Number of attempts: 1 Placement Confirmation: positive ETCO2 and breath sounds checked- equal and bilateral Tube secured with: Tape Dental Injury: Teeth and Oropharynx as per pre-operative assessment  Comments: Smooth atraumatic LMA placement, no complications noted.

## 2022-10-19 NOTE — H&P (Signed)
History of Present Illness: Courtney Blanchard is a 67 y.o.female who is being seen in consultation at the request of Dr. Rosita Kea for left knee pain. The symptoms began 5 to 6 months ago and developed without any specific cause or injury. Initially, the patient tried to manage her symptoms on her own, but because of continued pain, elected to seek orthopedic evaluation. She saw Dr. Rosita Kea who gave her steroid injection but this provided only limited if any benefit. Therefore, the patient was sent for an MRI scan and referred to me for further evaluation and treatment . She reports 6/10 pain in the knee on today's visit. The pain is located along the lateral more so than medial aspect of the knee. The pain is described as aching, dull, stabbing, and throbbing. The symptoms are aggravated with normal daily activities, using stairs, at higher levels of activity, walking, standing, and standing pivot. She also describes occasional episodes of "catching" in the knee, but denies any locking or true instability. She has mild associated swelling and no deformity. She has tried acetaminophen, over-the-counter medications, and steroid injections with at most limited benefit.  Current Outpatient Medications:  ACCU-CHEK AVIVA CONTROL SOLN Soln Use 1 each once daily 90 each 3  ACCU-CHEK AVIVA PLUS METER Misc USE AS DIRECTED 1 kit 0  ACCU-CHEK AVIVA PLUS TEST STRP test strip USE ONCE DAILY 100 strip 3  ACCU-CHEK SOFTCLIX LANCETS lancets USE ONE TIME DAILY 100 each 1  acetaminophen (TYLENOL) 500 MG tablet Take by mouth  albuterol 90 mcg/actuation inhaler Inhale 2 inhalations into the lungs every 4 (four) hours as needed for Wheezing or Shortness of Breath 1 each 3  ALPRAZolam (XANAX XR) 1 MG 24 hr tablet TAKE 1 TABLET BY MOUTH IN THE MORNING 30 tablet 5  ALPRAZolam (XANAX) 0.25 MG tablet TAKE 1 TABLET BY MOUTH EVERY DAY AS NEEDED 10 tablet 0  aspirin 81 MG EC tablet Take 81 mg by mouth once daily  BD ALCOHOL SWABS PadM USE  1 SWAB TOPICALLY ONE TIME DAILY 100 Swab 3  blood glucose diagnostic test strip once daily 100 each 3  blood glucose meter kit Use once daily 1 each 0  clopidogreL (PLAVIX) 75 mg tablet TAKE 1 TABLET EVERY DAY 90 tablet 3  DULoxetine (CYMBALTA) 20 MG DR capsule Take 1 capsule (20 mg total) by mouth 2 (two) times daily 180 capsule 3  gabapentin (NEURONTIN) 300 MG capsule TAKE 1 CAPSULE THREE TIMES DAILY 270 capsule 3  isosorbide mononitrate (IMDUR) 30 MG ER tablet TAKE 2 TABLETS (60 MG TOTAL) BY MOUTH ONCE DAILY 180 tablet 3  lancets Use 1 each once daily 100 each 3  montelukast (SINGULAIR) 10 mg tablet TAKE 1 TABLET EVERY DAY 90 tablet 3  nitroGLYcerin (NITROSTAT) 0.4 MG SL tablet DISSOLVE 1 TABLET UNDER THE TONGUE EVERY 5 MINUTES AS NEEDED FOR CHEST PAIN, MAY TAKE UP TO 3 DOSES 25 tablet 3  nystatin (MYCOSTATIN) 100,000 unit/mL suspension  rOPINIRole (REQUIP) 0.25 MG tablet TAKE 2 TABLETS EVERY NIGHT 180 tablet 3   Allergies:  Amoxicillin Hives  Atorvastatin Other (Tremors) Penicillin V Potassium Unknown  Statins-Hmg-Coa Reductase Inhibitors Other (Tremor/myalgias)  Tylenol W Codeine [Acetaminophen-Codeine] Vomiting   Past Medical History:   Allergic state  Asthma without status asthmaticus (HHS-HCC)  Borderline blood pressure  Coronary artery disease  Depression  Diabetes mellitus without complication (CMS/HHS-HCC)  Endometriosis  Migraines   Past Surgical History:  COLONOSCOPY 02/2009  HYSTERECTOMY  Oophorectomy for an ovarian cyst  Stents in  2005  Tubal pregnancy   Family History:  Myocardial Infarction (Heart attack) Father 40  High blood pressure (Hypertension) Mother  High blood pressure (Hypertension) Other grandparents  Heart disease Other grandparents  Diabetes type II Other grandparents   Social History:   Socioeconomic History:  Marital status: Widowed  Tobacco Use  Smoking status: Former  Types: Cigarettes  Passive exposure: Past  Smokeless tobacco:  Never  Tobacco comments:  Smoked in [employer]  Substance and Sexual Activity  Alcohol use: No  Drug use: Never   Social Determinants of Health:   Physicist, medical Strain: Low Risk (02/19/2019)  Received from [facility], [place]  Overall Financial Resource Strain (CARDIA)  Difficulty of Paying Living Expenses: Not hard at all  Food Insecurity: No Food Insecurity (02/19/2019)  Received from [facility], [place]  Hunger Vital Sign  Worried About Running Out of Food in the Last Year: Never true  Ran Out of Food in the Last Year: Never true  Transportation Needs: No Transportation Needs (02/19/2019)  Received from [facility], [place]  PRAPARE - Transportation  Lack of Transportation (Medical): No  Lack of Transportation (Non-Medical): No   Review of Systems:  A comprehensive 14 point ROS was performed, reviewed, and the pertinent orthopaedic findings are documented in the HPI.  Physical Exam: Vitals:  09/25/22 1322  BP: 104/70  Weight: 85.5 kg (188 lb 9.6 oz)  Height: 157.5 cm (5\' 2" )  PainSc: 6  PainLoc: Knee   General/Constitutional: The patient appears to be well-nourished, well-developed, and in no acute distress. Neuro/Psych: Normal mood and affect, oriented to person, place and time. Eyes: Non-icteric. Pupils are equal, round, and reactive to light, and exhibit synchronous movement. Lymphatic: No palpable adenopathy. Respiratory: Lungs clear to auscultation, Normal chest excursion, No wheezes, and Non-labored breathing Cardiovascular: Regular rate and rhythm. No murmurs. and No edema, swelling or tenderness, except as noted in detailed exam. Vascular: No edema, swelling or tenderness, except as noted in detailed exam. Integumentary: No impressive skin lesions present, except as noted in detailed exam. Musculoskeletal: Unremarkable, except as noted in detailed exam.  Left knee exam: GAIT: mild limp and uses no assistive devices. ALIGNMENT: normal SKIN:  Abundant varicose veins involving the knee area and lower leg, otherwise unremarkable SWELLING: minimal EFFUSION: trace WARMTH: no warmth TENDERNESS: mild over the lateral joint line and medial joint line ROM: 0 to 120 degrees with mild discomfort at maximal flexion McMURRAY'S: positive PATELLOFEMORAL: normal tracking with no peri-patellar tenderness and negative apprehension sign CREPITUS: no LACHMAN'S: negative PIVOT SHIFT: Not evaluated ANTERIOR DRAWER: negative POSTERIOR DRAWER: negative VARUS/VALGUS: stable  She is grossly neurovascularly intact to the left lower extremity and foot.  Knee Imaging, external: Left knee: A recent MRI scan of the left knee is available for review and has been reviewed by myself. By report, the study demonstrates evidence of complex tearing involving the central and posterior portions of the medial meniscus, as well as some degenerative tearing of the anterior portion of the lateral meniscus. In addition, there are diffuse mild-moderate degenerative changes involving all 3 compartments. Both the films and report were reviewed by myself and discussed with the patient.  Assessment:  1. Complex tear of medial meniscus of left knee. 2. Degenerative tear of lateral meniscus, left.  3. Primary osteoarthritis of left knee.   Plan: The treatment options were discussed with the patient. In addition, patient educational materials were provided regarding the diagnosis and treatment options. The patient is quite frustrated by  her symptoms and functional mentations, but does not yet wish to consider a total knee arthroplasty. Therefore, I have recommended a surgical procedure, specifically a left knee arthroscopy with debridement and partial medial and lateral meniscectomies. The procedure was discussed with the patient, as were the potential risks (including bleeding, infection, nerve and/or blood vessel injury, persistent or recurrent pain, failure of the repair,  progression of arthritis, need for further surgery, blood clots, strokes, heart attacks and/or arhythmias, pneumonia, etc.) and benefits. The patient states her understanding and wishes to proceed. All of the patient's questions and concerns were answered. She can call any time with further concerns. She will follow up post-surgery, routine.    H&P reviewed and patient re-examined. No changes.

## 2022-10-19 NOTE — Transfer of Care (Signed)
Immediate Anesthesia Transfer of Care Note  Patient: Courtney Blanchard  Procedure(s) Performed: LEFT KNEE ARTHROSCOPY WITH DEBRIDEMENT, PARTIAL MEDIAL AND LATERAL MENISCECTOMIES, AND REINJECTION OF HARVESTED INFRAPATELLAR FAT CELLS. (Left: Knee)  Patient Location: PACU  Anesthesia Type:General  Level of Consciousness: drowsy  Airway & Oxygen Therapy: Patient Spontanous Breathing and Patient connected to face mask oxygen  Post-op Assessment: Report given to RN and Post -op Vital signs reviewed and stable  Post vital signs: Reviewed and stable  Last Vitals:  Vitals Value Taken Time  BP 98/59 10/19/22 1508  Temp 36.1 C 10/19/22 1504  Pulse 74 10/19/22 1509  Resp 15 10/19/22 1509  SpO2 100 % 10/19/22 1509  Vitals shown include unfiled device data.  Last Pain:  Vitals:   10/19/22 1100  TempSrc: Oral         Complications: No notable events documented.

## 2022-10-19 NOTE — Anesthesia Postprocedure Evaluation (Signed)
Anesthesia Post Note  Patient: Courtney Blanchard  Procedure(s) Performed: LEFT KNEE ARTHROSCOPY WITH DEBRIDEMENT, PARTIAL MEDIAL AND LATERAL MENISCECTOMIES, AND REINJECTION OF HARVESTED INFRAPATELLAR FAT CELLS. (Left: Knee)  Patient location during evaluation: PACU Anesthesia Type: General Level of consciousness: awake and alert Pain management: pain level controlled Vital Signs Assessment: post-procedure vital signs reviewed and stable Respiratory status: spontaneous breathing, nonlabored ventilation, respiratory function stable and patient connected to nasal cannula oxygen Cardiovascular status: blood pressure returned to baseline and stable Postop Assessment: no apparent nausea or vomiting Anesthetic complications: no  No notable events documented.   Last Vitals:  Vitals:   10/19/22 1510 10/19/22 1515  BP: 127/65 (!) 92/50  Pulse: 74 78  Resp: 15 15  Temp:    SpO2: 100% 100%    Last Pain:  Vitals:   10/19/22 1100  TempSrc: Oral                 Stephanie Coup

## 2022-10-19 NOTE — Discharge Instructions (Addendum)
Orthopedic discharge instructions: Keep dressing dry and intact.  May shower after dressing changed on post-op day #4 (Monday).  Cover sutures with Band-Aids after drying off. Apply ice frequently to knee. Take pain medication as prescribed or ES Tylenol when needed.  May weight-bear as tolerated - use crutches or walker as needed. Follow-up in 10-14 days or as scheduled.   AMBULATORY SURGERY  DISCHARGE INSTRUCTIONS   The drugs that you were given will stay in your system until tomorrow so for the next 24 hours you should not:  Drive an automobile Make any legal decisions Drink any alcoholic beverage   You may resume regular meals tomorrow.  Today it is better to start with liquids and gradually work up to solid foods.  You may eat anything you prefer, but it is better to start with liquids, then soup and crackers, and gradually work up to solid foods.   Please notify your doctor immediately if you have any unusual bleeding, trouble breathing, redness and pain at the surgery site, drainage, fever, or pain not relieved by medication.    Additional Instructions:        Please contact your physician with any problems or Same Day Surgery at [phone], Monday through Friday 6 am to 4 pm, or [place] at [facility] number at [phone].

## 2022-10-19 NOTE — Op Note (Signed)
10/19/2022  3:26 PM  Patient:   Courtney Blanchard  Pre-Op Diagnosis:   Medial and lateral meniscal tears with underlying degenerative joint disease, left knee.  Postoperative diagnosis:   Same  Procedure:   Arthroscopic partial synovectomy, partial medial and lateral meniscectomies, and reinjection of harvested infrapatellar fat cells, left knee.  Surgeon:   Maryagnes Amos, MD  Anesthesia:   General LMA  Findings:   As above.  There were diffuse grade 2-3 chondromalacial changes involving the femoral trochlea, the medial femoral condyle, and the lateral patellar facet.  There was extensive mucoid degenerative of the ACL with resultant laxity.  The posterior cruciate ligament was in satisfactory condition.  Complications:   None  EBL:   5 cc.  Total fluids:   500 cc of crystalloid.  Tourniquet time:   None  Drains:   None  Closure:   4-0 Prolene interrupted sutures.  Brief clinical note:   The patient is a 67 year old female with a history of left knee pain. Her symptoms have progressed despite medications, activity modification, etc. Her history and examination consistent with a medial meniscus tear. Preoperative MRI scanning confirmed the presence of medial and lateral meniscal tears as well as underlying degenerative joint disease. The patient presents at this time for arthroscopy, debridement, partial medial and lateral meniscectomies, and reinjection of the harvested infrapatellar fat cells of her left knee.  Procedure:   The patient was brought into the operating room and lain in the supine position. After adequate general laryngeal mask anesthesia was obtained, a timeout was performed to verify the appropriate side. The patient's left knee was injected sterilely using a solution of 30 cc of 1% lidocaine and 30 cc of 0.5% Sensorcaine with epinephrine. The left lower extremity was prepped with ChloraPrep solution before being draped sterilely. Preoperative antibiotics were  administered. The expected portal sites were injected with 0.5% Sensorcaine with epinephrine before the camera was placed in the anterolateral portal and instrumentation performed through the anteromedial portal.   The knee was sequentially examined beginning in the suprapatellar pouch, then progressing to the patellofemoral space, the medial gutter and compartment, the notch, and finally the lateral compartment and gutter. The findings were as described above. Abundant reactive synovial tissues anteriorly were debrided using the full-radius resector in order to improve visualization. This fatty tissue was collected using the Arth[employer] GraftNet system and kept on the back table for later reinjection at the end of the case. The areas of complex tearing involving the the posterior portion of the medial meniscus and the anterior portion of the lateral meniscus were debrided back to stable margins using the appropriate straight and up biting baskets immediately, and side and back biting baskets laterally, as well as the full-radius resector. Subsequent probing of the remaining rim demonstrated excellent stability. The instruments were removed from the joint after suctioning the excess fluid.   The lateral portal site was closed using 4-0 Prolene interrupted sutures. Medially, the harvested fat cells were reinjected into the knee utilizing a Toomey syringe and red rubber catheter before this portal also was closed using 4-0 Prolene interrupted sutures. A sterile bulky dressing was applied to the knee. The patient was then awakened, extubated, and returned to the recovery room in satisfactory condition after tolerating the procedure well.

## 2022-10-19 NOTE — Anesthesia Preprocedure Evaluation (Signed)
Anesthesia Evaluation  Patient identified by MRN, date of birth, ID band Patient awake    Reviewed: Allergy & Precautions, NPO status , Patient's Chart, lab work & pertinent test results  History of Anesthesia Complications (+) PONV and history of anesthetic complications  Airway Mallampati: II  TM Distance: >3 FB Neck ROM: Full    Dental  (+) Edentulous Upper, Chipped   Pulmonary shortness of breath and at rest, neg sleep apnea, COPD,  COPD inhaler, Patient abstained from smoking.Not current smoker Sporadic inhaler use. Breathing today feels a touch below baseline. Lungs CTAB. Will give preop duoneb   Pulmonary exam normal breath sounds clear to auscultation       Cardiovascular Exercise Tolerance: Poor METShypertension, + CAD, + Past MI, + Cardiac Stents and +CHF  (-) dysrhythmias  Rhythm:Regular Rate:Normal - Systolic murmurs Last echo on record was at time of her STEMI, EF 40-45%   Neuro/Psych  Headaches PSYCHIATRIC DISORDERS Anxiety Depression       GI/Hepatic ,neg GERD  ,,(+)     (-) substance abuse    Endo/Other  diabetes    Renal/GU negative Renal ROS     Musculoskeletal  (+) Arthritis ,    Abdominal   Peds  Hematology   Anesthesia Other Findings Past Medical History: 02/13/2019: Acute ST elevation myocardial infarction (STEMI) of  anterior wall (HCC)     Comment:  a.) LHC 02/13/2019: 70% m-d LAD. Patent               ostial/proximal/mid RCA stents. SCAD mLAD --> medical               mgmt No date: Anxiety     Comment:  a.) on BZO PRN (alprazolam) No date: Asthma No date: Baker's cyst, left No date: Bronchitis No date: Chronic cough No date: Complex tear of medial meniscus of left knee as current  injury No date: Complication of anesthesia     Comment:  a.) PONV No date: COPD (chronic obstructive pulmonary disease) (HCC) 01/16/2004: Coronary artery disease     Comment:  a.) LHC/PCI 01/13/2004:  70% oRCA (3.5x8 mm Vision BMS),               50% pRCA 3.5x23 mm Cypher DES), 90% mRCA (3.5x23 mm               Cypher DES); b.) LHC/PCI 03/24/2004: 95% ISR pRCA (3.5x18              Cypher DES); c.) LHC 05/26/2008: pat stents - med mgmt;               d.) LHC 01/18/2011: pat stents - med mgmt; e.) LHC               01/28/2018: 20% ISR o-mRCA, 20% ISR mRCA - med mgmt; f.)               LHC 02/13/2019: 70% m-dLAD. Pat RCA stents. SCAD mLAD --               med mgmt No date: Degenerative tear of lateral meniscus, left No date: Depression 05/2008: Diet-controlled type 2 diabetes mellitus (HCC) No date: Diverticulosis No date: Dyspnea No date: Endometriosis No date: Environmental and seasonal allergies 2023: History of bilateral cataract extraction No date: HLD (hyperlipidemia) No date: Hypertension No date: Long term current use of aspirin No date: Long term current use of clopidogrel No date: Lower extremity edema No date: Migraine headache No date: Palpitation No  date: Pes anserine bursitis No date: Pneumonia No date: PONV (postoperative nausea and vomiting) No date: Primary osteoarthritis of left knee No date: RLS (restless legs syndrome) 02/13/2019: Spontaneous dissection of coronary artery     Comment:  a.) LHC 02/13/2019 in setting of anterior STEMI -->               medical mgmt No date: Tremors of nervous system No date: Unstable angina (HCC) No date: Varicose veins of both lower extremities     Comment:  a.) s/p GSV laser ablation; b.) s/p foam sclerotherapy No date: Wears dentures     Comment:  Partial upper No date: Wheezing  Reproductive/Obstetrics                              Anesthesia Physical Anesthesia Plan  ASA: 3  Anesthesia Plan: General   Post-op Pain Management: Ofirmev IV (intra-op)*   Induction: Intravenous  PONV Risk Score and Plan: 4 or greater and Ondansetron, Dexamethasone and Midazolam  Airway Management Planned:  LMA  Additional Equipment: None  Intra-op Plan:   Post-operative Plan: Extubation in OR  Informed Consent: I have reviewed the patients History and Physical, chart, labs and discussed the procedure including the risks, benefits and alternatives for the proposed anesthesia with the patient or authorized representative who has indicated his/her understanding and acceptance.     Dental advisory given  Plan Discussed with: CRNA and Surgeon  Anesthesia Plan Comments: (Discussed risks of anesthesia with patient, including PONV, sore throat, lip/dental/eye damage. Rare risks discussed as well, such as cardiorespiratory and neurological sequelae, and allergic reactions. Discussed the role of CRNA in patient's perioperative care. Patient understands.  Patient has listed allergy to PCN - tongue and throat swelling as a teenager. Severe blistering skin reaction (SJS/TEN)? no Liver or kidney injury caused by PCN? no Hemolytic anemia from PCN? no Drug fever? no Painful swollen joints? no Severe reaction involving inside of mouth, eye, or genital ulcers? no Based on current evidence Elizebeth Koller et al, J Allergy Clin Immunol Pract, 2019), will proceed with cefazolin use: Yes  )         Anesthesia Quick Evaluation

## 2022-10-20 ENCOUNTER — Other Ambulatory Visit: Payer: Self-pay

## 2022-10-20 ENCOUNTER — Encounter: Payer: Self-pay | Admitting: Surgery

## 2022-10-20 ENCOUNTER — Emergency Department
Admission: EM | Admit: 2022-10-20 | Discharge: 2022-10-20 | Disposition: A | Discharge status: Home / Self Care | Payer: Medicare HMO | Attending: Emergency Medicine | Admitting: Emergency Medicine

## 2022-10-20 DIAGNOSIS — Z4801 Encounter for change or removal of surgical wound dressing: Secondary | ICD-10-CM | POA: Insufficient documentation

## 2022-10-20 DIAGNOSIS — T8131XA Disruption of external operation (surgical) wound, not elsewhere classified, initial encounter: Secondary | ICD-10-CM | POA: Diagnosis not present

## 2022-10-20 DIAGNOSIS — T8130XA Disruption of wound, unspecified, initial encounter: Secondary | ICD-10-CM | POA: Diagnosis not present

## 2022-10-20 DIAGNOSIS — S83232D Complex tear of medial meniscus, current injury, left knee, subsequent encounter: Secondary | ICD-10-CM | POA: Diagnosis not present

## 2022-10-20 DIAGNOSIS — Z5189 Encounter for other specified aftercare: Secondary | ICD-10-CM

## 2022-10-20 MED ORDER — OXYCODONE-ACETAMINOPHEN 5-325 MG PO TABS
1.0000 | ORAL_TABLET | Freq: Once | ORAL | Status: AC
  Administered 2022-10-20: 1 via ORAL
  Filled 2022-10-20: qty 1

## 2022-10-20 NOTE — ED Triage Notes (Signed)
Pt to ED for post op bleeding to left leg started yesterday. Had meniscus repair. States has been through 5 dressings. Restarted blood thinners this am. No bleeding noted at this time

## 2022-10-20 NOTE — ED Provider Notes (Signed)
[facility] Provider Note    Event Date/Time   First MD Initiated Contact with Patient 10/20/22 2101     (approximate)   History   Post-op Problem   HPI Courtney Blanchard is a 67 y.o. female who underwent arthroscopic left knee surgery on her meniscus yesterday who presents today for postop bleeding.  Patient has noticed bleeding coming out of one of the insertion sites that seems more than normal.  She was seen earlier in the clinic today where they threw an additional stitch and sent her home.  Patient noticed bleeding again return to the emergency department for further evaluation.  She denies any fevers, chills, redness around the site.  No other warmth.  Pain is as expected postoperative in the first 24 hours.     Physical Exam   Triage Vital Signs: ED Triage Vitals  Encounter Vitals Group     BP 10/20/22 1833 91/61     Systolic BP Percentile --      Diastolic BP Percentile --      Pulse Rate 10/20/22 1833 66     Resp 10/20/22 1833 20     Temp 10/20/22 1833 98.4 F (36.9 C)     Temp Source 10/20/22 1833 Oral     SpO2 10/20/22 1833 95 %     Weight 10/20/22 1834 179 lb (81.2 kg)     Height 10/20/22 1834 5\' 2"  (1.575 m)     Head Circumference --      Peak Flow --      Pain Score 10/20/22 1833 3     Pain Loc --      Pain Education --      Exclude from Growth Chart --     Most recent vital signs: Vitals:   10/20/22 1833 10/20/22 2240  BP: 91/61 103/70  Pulse: 66 (!) 59  Resp: 20 20  Temp: 98.4 F (36.9 C)   SpO2: 95% 98%    I have reviewed the vital signs. General:  Awake, alert, no acute distress. Head:  Normocephalic, Atraumatic. EENT:  PERRL, EOMI, Oral mucosa pink and moist, Neck is supple. Cardiovascular: Regular rate, 2+ distal pulses. Respiratory:  Normal respiratory effort, symmetrical expansion, no distress.   Extremities: Left knee wrapped in gauze with bleeding noticed at the bandages.  To insertion site seen after  uncovering the bandage.  The medial 1 with no bleeding.  The left-sided 1 has a small area of fluid coming out but largely unexposed.  No evidence of erythema or infection.  Swelling consistent with 1 day postop. Neuro:  Alert and oriented.  Interacting appropriately.   Skin:  Warm, dry, no rash.   Psych: Appropriate affect.    ED Results / Procedures / Treatments   Labs (all labs ordered are listed, but only abnormal results are displayed) Labs Reviewed - No data to display   EKG    RADIOLOGY   PROCEDURES:  Critical Care performed: No  Procedures   MEDICATIONS ORDERED IN ED: Medications  oxyCODONE-acetaminophen (PERCOCET/ROXICET) 5-325 MG per tablet 1 tablet (1 tablet Oral Given 10/20/22 2151)     IMPRESSION / MDM / ASSESSMENT AND PLAN / ED COURSE  I reviewed the triage vital signs and the nursing notes.                              Differential diagnosis includes, but is not limited to, wound dehiscence, postoperative hematoma,  postoperative pain.  Patient's presentation is most consistent with acute, uncomplicated illness.  Patient is a 67 year old female who presents today for wound drainage 24 hours out from arthroscopic knee surgery.  There is a pinpoint spot of drainage along one of the insertion sites.  Orthopedics was consulted given 24 hours out from the initial surgery.  They recommended compressive dressing and to be in a knee immobilizer which patient already has.  I was able to get the bleeding to stop with a pressure dressing here.  They had also initially suggested that if this would not work to put a wound VAC on the site which was not required to stop the bleeding.  Patient was stable for discharge and given instructions on when to follow-up with orthopedics.  Clinical Course as of 10/20/22 2334  Caleen Essex Oct 20, 2022  2131 Ortho - Trial gauze, wrap with ace wrap, wrap snug, put in knee immobilizer. Keep straight for the next day or two. Keep straight, wrapped  compressed x24 hours. Elevation. [DW]  2149 Provena incisional vac can be put on it. Call OR control desk. Small size. [DW]    Clinical Course User Index [DW] Janith Lima, MD     FINAL CLINICAL IMPRESSION(S) / ED DIAGNOSES   Final diagnoses:  Wound dehiscence  Visit for wound check     Rx / DC Orders   ED Discharge Orders     None        Note:  This document was prepared using Dragon voice recognition software and may include unintentional dictation errors.   Janith Lima, MD 10/20/22 [phone]

## 2022-10-20 NOTE — Discharge Instructions (Signed)
You are seen in the emergency department today for your postop wound drainage.  A pressure dressing was applied.  Please keep this on for the next 24 hours.  Please keep the leg elevated as well as much as possible.  Please do not bend or extend your knee for this time period.  Please try to stay off it is much as possible over the next 24 hours.  Please follow-up with orthopedics for further concerns.

## 2022-10-26 ENCOUNTER — Telehealth: Payer: Self-pay

## 2022-10-26 NOTE — Telephone Encounter (Signed)
Transition Care Management Follow-up Telephone Call Date of discharge and from where: [place] 8/9 How have you been since you were released from the hospital? Leg stopped bleeding but painful Any questions or concerns? No  Items Reviewed: Did the pt receive and understand the discharge instructions provided? Yes  Medications obtained and verified? Yes  Other? No  Any new allergies since your discharge? No  Dietary orders reviewed? No Do you have support at home? Yes     Follow up appointments reviewed:  PCP Hospital f/u appt confirmed? Yes  Scheduled to see PCP on 8/21 @ . Specialist Hospital f/u appt confirmed? No  Scheduled to see  on  @ . Are transportation arrangements needed? Yes  If their condition worsens, is the pt aware to call PCP or go to the Emergency Dept.? Yes Was the patient provided with contact information for the PCP's office or ED? Yes Was to pt encouraged to call back with questions or concerns? Yes

## 2022-10-31 ENCOUNTER — Ambulatory Visit (INDEPENDENT_AMBULATORY_CARE_PROVIDER_SITE_OTHER): Payer: Medicare HMO | Admitting: Vascular Surgery

## 2023-01-26 DIAGNOSIS — K219 Gastro-esophageal reflux disease without esophagitis: Secondary | ICD-10-CM | POA: Diagnosis not present

## 2023-01-26 DIAGNOSIS — Z9884 Bariatric surgery status: Secondary | ICD-10-CM | POA: Diagnosis not present

## 2023-01-26 DIAGNOSIS — Z1211 Encounter for screening for malignant neoplasm of colon: Secondary | ICD-10-CM | POA: Diagnosis not present

## 2023-01-26 DIAGNOSIS — Z23 Encounter for immunization: Secondary | ICD-10-CM | POA: Diagnosis not present

## 2023-01-26 DIAGNOSIS — Z139 Encounter for screening, unspecified: Secondary | ICD-10-CM | POA: Diagnosis not present

## 2023-01-26 DIAGNOSIS — Z7902 Long term (current) use of antithrombotics/antiplatelets: Secondary | ICD-10-CM | POA: Diagnosis not present

## 2023-01-26 DIAGNOSIS — E538 Deficiency of other specified B group vitamins: Secondary | ICD-10-CM | POA: Diagnosis not present

## 2023-01-26 DIAGNOSIS — R110 Nausea: Secondary | ICD-10-CM | POA: Diagnosis not present

## 2023-03-01 ENCOUNTER — Emergency Department
Admission: EM | Admit: 2023-03-01 | Discharge: 2023-03-01 | Disposition: A | Discharge status: Home / Self Care | Payer: Medicare HMO | Attending: Emergency Medicine | Admitting: Emergency Medicine

## 2023-03-01 ENCOUNTER — Emergency Department: Payer: Medicare HMO

## 2023-03-01 DIAGNOSIS — Z043 Encounter for examination and observation following other accident: Secondary | ICD-10-CM | POA: Diagnosis not present

## 2023-03-01 DIAGNOSIS — M25511 Pain in right shoulder: Secondary | ICD-10-CM | POA: Insufficient documentation

## 2023-03-01 DIAGNOSIS — Y92410 Unspecified street and highway as the place of occurrence of the external cause: Secondary | ICD-10-CM | POA: Diagnosis not present

## 2023-03-01 DIAGNOSIS — I700 Atherosclerosis of aorta: Secondary | ICD-10-CM | POA: Diagnosis not present

## 2023-03-01 DIAGNOSIS — Z041 Encounter for examination and observation following transport accident: Secondary | ICD-10-CM | POA: Diagnosis not present

## 2023-03-01 DIAGNOSIS — M25512 Pain in left shoulder: Secondary | ICD-10-CM | POA: Insufficient documentation

## 2023-03-01 DIAGNOSIS — S161XXA Strain of muscle, fascia and tendon at neck level, initial encounter: Secondary | ICD-10-CM | POA: Diagnosis not present

## 2023-03-01 DIAGNOSIS — J449 Chronic obstructive pulmonary disease, unspecified: Secondary | ICD-10-CM | POA: Diagnosis not present

## 2023-03-01 DIAGNOSIS — I6523 Occlusion and stenosis of bilateral carotid arteries: Secondary | ICD-10-CM | POA: Diagnosis not present

## 2023-03-01 DIAGNOSIS — E119 Type 2 diabetes mellitus without complications: Secondary | ICD-10-CM | POA: Diagnosis not present

## 2023-03-01 DIAGNOSIS — I10 Essential (primary) hypertension: Secondary | ICD-10-CM | POA: Insufficient documentation

## 2023-03-01 DIAGNOSIS — M542 Cervicalgia: Secondary | ICD-10-CM | POA: Diagnosis not present

## 2023-03-01 DIAGNOSIS — M79603 Pain in arm, unspecified: Secondary | ICD-10-CM | POA: Diagnosis not present

## 2023-03-01 LAB — BASIC METABOLIC PANEL
Anion gap: 9 (ref 5–15)
BUN: 15 mg/dL (ref 8–23)
CO2: 26 mmol/L (ref 22–32)
Calcium: 9.1 mg/dL (ref 8.9–10.3)
Chloride: 104 mmol/L (ref 98–111)
Creatinine, Ser: 0.69 mg/dL (ref 0.44–1.00)
GFR, Estimated: >60 mL/min (ref >60)
Glucose, Bld: 82 mg/dL (ref 70–99)
Potassium: 3.9 mmol/L (ref 3.5–5.1)
Sodium: 139 mmol/L (ref 135–145)

## 2023-03-01 LAB — CBC WITH DIFFERENTIAL/PLATELET
Abs Immature Granulocytes: 0.01 10*3/uL (ref 0.00–0.07)
Basophils Absolute: 0 10*3/uL (ref 0.0–0.1)
Basophils Relative: 0 %
Eosinophils Absolute: 0.1 10*3/uL (ref 0.0–0.5)
Eosinophils Relative: 1 %
HCT: 36.9 % (ref 36.0–46.0)
Hemoglobin: 11.9 g/dL — ABNORMAL LOW (ref 12.0–15.0)
Immature Granulocytes: 0 %
Lymphocytes Relative: 48 %
Lymphs Abs: 2 10*3/uL (ref 0.7–4.0)
MCH: 29.2 pg (ref 26.0–34.0)
MCHC: 32.2 g/dL (ref 30.0–36.0)
MCV: 90.4 fL (ref 80.0–100.0)
Monocytes Absolute: 0.4 10*3/uL (ref 0.1–1.0)
Monocytes Relative: 9 %
Neutro Abs: 1.7 10*3/uL (ref 1.7–7.7)
Neutrophils Relative %: 42 %
Platelets: 223 10*3/uL (ref 150–400)
RBC: 4.08 MIL/uL (ref 3.87–5.11)
RDW: 13.2 % (ref 11.5–15.5)
WBC: 4.1 10*3/uL (ref 4.0–10.5)
nRBC: 0 % (ref 0.0–0.2)

## 2023-03-01 MED ORDER — IOHEXOL 350 MG/ML SOLN
75.0000 mL | Freq: Once | INTRAVENOUS | Status: AC | PRN
  Administered 2023-03-01: 75 mL via INTRAVENOUS

## 2023-03-01 MED ORDER — LORAZEPAM 2 MG/ML IJ SOLN
1.0000 mg | Freq: Once | INTRAMUSCULAR | Status: AC
  Administered 2023-03-01: 1 mg via INTRAVENOUS
  Filled 2023-03-01: qty 1

## 2023-03-01 MED ORDER — ONDANSETRON HCL 4 MG/2ML IJ SOLN
4.0000 mg | Freq: Once | INTRAMUSCULAR | Status: AC
  Administered 2023-03-01: 4 mg via INTRAVENOUS
  Filled 2023-03-01: qty 2
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
## 2023-03-14 DIAGNOSIS — R002 Palpitations: Secondary | ICD-10-CM

## 2023-03-14 HISTORY — DX: Palpitations: R00.2

## 2023-04-18 DIAGNOSIS — E538 Deficiency of other specified B group vitamins: Secondary | ICD-10-CM | POA: Diagnosis not present

## 2023-04-24 ENCOUNTER — Encounter: Payer: Self-pay | Admitting: *Deleted

## 2023-04-30 ENCOUNTER — Encounter: Payer: Self-pay | Admitting: *Deleted

## 2023-05-01 ENCOUNTER — Other Ambulatory Visit: Payer: Self-pay

## 2023-05-01 ENCOUNTER — Ambulatory Visit: Payer: Medicare HMO | Admitting: Certified Registered"

## 2023-05-01 ENCOUNTER — Encounter: Payer: Self-pay | Admitting: *Deleted

## 2023-05-01 ENCOUNTER — Ambulatory Visit
Admission: RE | Admit: 2023-05-01 | Discharge: 2023-05-01 | Disposition: A | Discharge status: Home / Self Care | Payer: Medicare HMO | Attending: Gastroenterology | Admitting: Gastroenterology

## 2023-05-01 ENCOUNTER — Encounter
Admission: RE | Disposition: A | Discharge status: Home / Self Care | Payer: Self-pay | Source: Home / Self Care | Attending: Gastroenterology

## 2023-05-01 DIAGNOSIS — K3189 Other diseases of stomach and duodenum: Secondary | ICD-10-CM | POA: Diagnosis not present

## 2023-05-01 DIAGNOSIS — R110 Nausea: Secondary | ICD-10-CM | POA: Insufficient documentation

## 2023-05-01 DIAGNOSIS — G8929 Other chronic pain: Secondary | ICD-10-CM | POA: Insufficient documentation

## 2023-05-01 DIAGNOSIS — Z7902 Long term (current) use of antithrombotics/antiplatelets: Secondary | ICD-10-CM | POA: Insufficient documentation

## 2023-05-01 DIAGNOSIS — Z1211 Encounter for screening for malignant neoplasm of colon: Secondary | ICD-10-CM | POA: Insufficient documentation

## 2023-05-01 DIAGNOSIS — K319 Disease of stomach and duodenum, unspecified: Secondary | ICD-10-CM | POA: Diagnosis not present

## 2023-05-01 DIAGNOSIS — K449 Diaphragmatic hernia without obstruction or gangrene: Secondary | ICD-10-CM | POA: Diagnosis not present

## 2023-05-01 DIAGNOSIS — I2510 Atherosclerotic heart disease of native coronary artery without angina pectoris: Secondary | ICD-10-CM | POA: Insufficient documentation

## 2023-05-01 DIAGNOSIS — F32A Depression, unspecified: Secondary | ICD-10-CM | POA: Insufficient documentation

## 2023-05-01 DIAGNOSIS — K5730 Diverticulosis of large intestine without perforation or abscess without bleeding: Secondary | ICD-10-CM | POA: Insufficient documentation

## 2023-05-01 DIAGNOSIS — F419 Anxiety disorder, unspecified: Secondary | ICD-10-CM | POA: Diagnosis not present

## 2023-05-01 DIAGNOSIS — K640 First degree hemorrhoids: Secondary | ICD-10-CM | POA: Insufficient documentation

## 2023-05-01 DIAGNOSIS — I509 Heart failure, unspecified: Secondary | ICD-10-CM | POA: Diagnosis not present

## 2023-05-01 DIAGNOSIS — I5020 Unspecified systolic (congestive) heart failure: Secondary | ICD-10-CM | POA: Diagnosis not present

## 2023-05-01 DIAGNOSIS — I110 Hypertensive heart disease with heart failure: Secondary | ICD-10-CM | POA: Diagnosis not present

## 2023-05-01 DIAGNOSIS — Z87891 Personal history of nicotine dependence: Secondary | ICD-10-CM | POA: Diagnosis not present

## 2023-05-01 DIAGNOSIS — Z980 Intestinal bypass and anastomosis status: Secondary | ICD-10-CM | POA: Diagnosis not present

## 2023-05-01 DIAGNOSIS — K5790 Diverticulosis of intestine, part unspecified, without perforation or abscess without bleeding: Secondary | ICD-10-CM | POA: Diagnosis not present

## 2023-05-01 DIAGNOSIS — I252 Old myocardial infarction: Secondary | ICD-10-CM | POA: Diagnosis not present

## 2023-05-01 DIAGNOSIS — E119 Type 2 diabetes mellitus without complications: Secondary | ICD-10-CM | POA: Insufficient documentation

## 2023-05-01 DIAGNOSIS — Z79891 Long term (current) use of opiate analgesic: Secondary | ICD-10-CM | POA: Diagnosis not present

## 2023-05-01 DIAGNOSIS — Z955 Presence of coronary angioplasty implant and graft: Secondary | ICD-10-CM | POA: Diagnosis not present

## 2023-05-01 DIAGNOSIS — I25110 Atherosclerotic heart disease of native coronary artery with unstable angina pectoris: Secondary | ICD-10-CM | POA: Diagnosis not present

## 2023-05-01 DIAGNOSIS — K649 Unspecified hemorrhoids: Secondary | ICD-10-CM | POA: Diagnosis not present

## 2023-05-01 DIAGNOSIS — Z9884 Bariatric surgery status: Secondary | ICD-10-CM | POA: Insufficient documentation

## 2023-05-01 HISTORY — DX: Palpitations: R00.2

## 2023-05-01 HISTORY — PX: BIOPSY: SHX5522

## 2023-05-01 HISTORY — PX: COLONOSCOPY WITH PROPOFOL: SHX5780

## 2023-05-01 HISTORY — PX: ESOPHAGOGASTRODUODENOSCOPY (EGD) WITH PROPOFOL: SHX5813

## 2023-05-01 LAB — GLUCOSE, CAPILLARY: Glucose-Capillary: 93 mg/dL (ref 70–99)

## 2023-05-01 SURGERY — ESOPHAGOGASTRODUODENOSCOPY (EGD) WITH PROPOFOL
Anesthesia: General

## 2023-05-01 MED ORDER — PROPOFOL 10 MG/ML IV BOLUS
INTRAVENOUS | Status: DC | PRN
  Administered 2023-05-01: 100 mg via INTRAVENOUS
  Administered 2023-05-01: 130 ug/kg/min via INTRAVENOUS

## 2023-05-01 MED ORDER — SODIUM CHLORIDE 0.9 % IV SOLN: INTRAVENOUS | Status: DC

## 2023-05-01 MED ORDER — PROPOFOL 10 MG/ML IV BOLUS
INTRAVENOUS | Status: AC
  Filled 2023-05-01: qty 40

## 2023-05-01 MED ORDER — EPHEDRINE SULFATE-NACL 50-0.9 MG/10ML-% IV SOSY
PREFILLED_SYRINGE | INTRAVENOUS | Status: DC | PRN
Start: 2023-05-01 — End: 2023-05-01
  Administered 2023-05-01: 5 mg via INTRAVENOUS

## 2023-05-01 MED ORDER — LIDOCAINE HCL (CARDIAC) PF 100 MG/5ML IV SOSY
PREFILLED_SYRINGE | INTRAVENOUS | Status: DC | PRN
  Administered 2023-05-01: 100 mg via INTRAVENOUS

## 2023-05-01 NOTE — Op Note (Signed)
 [facility] Patient Name: Courtney Blanchard Procedure Date: 05/01/2023 7:48 AM MRN: [id number] Account #: [id number] Date of Birth: 10/14/1955 Admit Type: Outpatient Age: 68 Room: [facility] ENDO ROOM 1 Gender: Female Note Status: Finalized Instrument Name: Prentice Docker [id number] Procedure:             Colonoscopy Indications:           Screening for colorectal malignant neoplasm Providers:             Eather Colas MD, MD Referring MD:          Eather Colas MD, MD (Referring MD), Rhona Leavens.                         Burnett Sheng, MD (Referring MD) Medicines:             Monitored Anesthesia Care Complications:         No immediate complications. Procedure:             Pre-Anesthesia Assessment:                        - Prior to the procedure, a History and Physical was                         performed, and patient medications and allergies were                         reviewed. The patient is competent. The risks and                         benefits of the procedure and the sedation options and                         risks were discussed with the patient. All questions                         were answered and informed consent was obtained.                         Patient identification and proposed procedure were                         verified by the physician, the nurse, the                         anesthesiologist, the anesthetist and the technician                         in the endoscopy suite. Mental Status Examination:                         alert and oriented. Airway Examination: normal                         oropharyngeal airway and neck mobility. Respiratory                         Examination: clear to auscultation. CV Examination:  normal. Prophylactic Antibiotics: The patient does not                         require prophylactic antibiotics. Prior                         Anticoagulants: The patient has taken Plavix                          (clopidogrel), last dose was 6 days prior to                         procedure. ASA Grade Assessment: III - A patient with                         severe systemic disease. After reviewing the risks and                         benefits, the patient was deemed in satisfactory                         condition to undergo the procedure. The anesthesia                         plan was to use monitored anesthesia care (MAC).                         Immediately prior to administration of medications,                         the patient was re-assessed for adequacy to receive                         sedatives. The heart rate, respiratory rate, oxygen                         saturations, blood pressure, adequacy of pulmonary                         ventilation, and response to care were monitored                         throughout the procedure. The physical status of the                         patient was re-assessed after the procedure.                        After obtaining informed consent, the colonoscope was                         passed under direct vision. Throughout the procedure,                         the patient's blood pressure, pulse, and oxygen                         saturations were monitored continuously. The  Colonoscope was introduced through the anus and                         advanced to the the cecum, identified by appendiceal                         orifice and ileocecal valve. The colonoscopy was                         performed without difficulty. The patient tolerated                         the procedure well. The quality of the bowel                         preparation was good. The ileocecal valve, appendiceal                         orifice, and rectum were photographed. Findings:      The perianal and digital rectal examinations were normal.      Scattered small-mouthed diverticula were found in the sigmoid colon,        descending colon, transverse colon and ascending colon.      There was evidence of a prior end-to-end colo-colonic anastomosis in the       sigmoid colon. This was patent and was characterized by healthy       appearing mucosa.      Internal hemorrhoids were found during retroflexion. The hemorrhoids       were Grade I (internal hemorrhoids that do not prolapse).      The exam was otherwise without abnormality on direct and retroflexion       views. Impression:            - Diverticulosis in the sigmoid colon, in the                         descending colon, in the transverse colon and in the                         ascending colon.                        - Patent end-to-end colo-colonic anastomosis,                         characterized by healthy appearing mucosa.                        - Internal hemorrhoids.                        - The examination was otherwise normal on direct and                         retroflexion views.                        - No specimens collected. Recommendation:        - Discharge patient to home.                        -  Resume previous diet.                        - Continue present medications.                        - Repeat colonoscopy in 10 years for screening                         purposes.                        - Return to referring physician as previously                         scheduled. Procedure Code(s):     --- Professional ---                        W0981, Colorectal cancer screening; colonoscopy on                         individual not meeting criteria for high risk Diagnosis Code(s):     --- Professional ---                        Z12.11, Encounter for screening for malignant neoplasm                         of colon                        K64.0, First degree hemorrhoids                        Z98.0, Intestinal bypass and anastomosis status                        K57.30, Diverticulosis of large intestine without                          perforation or abscess without bleeding CPT copyright 2022 American Medical Association. All rights reserved. The codes documented in this report are preliminary and upon coder review may  be revised to meet current compliance requirements. Eather Colas MD, MD 05/01/2023 8:22:21 AM Number of Addenda: 0 Note Initiated On: 05/01/2023 7:48 AM Scope Withdrawal Time: 0 hours 7 minutes 7 seconds  Total Procedure Duration: 0 hours 12 minutes 44 seconds  Estimated Blood Loss:  Estimated blood loss: none.      [facility]

## 2023-05-01 NOTE — Transfer of Care (Signed)
 Immediate Anesthesia Transfer of Care Note  Patient: Courtney Blanchard  Procedure(s) Performed: ESOPHAGOGASTRODUODENOSCOPY (EGD) WITH PROPOFOL COLONOSCOPY WITH PROPOFOL  Patient Location: Endoscopy Unit  Anesthesia Type:General  Level of Consciousness: drowsy  Airway & Oxygen Therapy: Patient Spontanous Breathing  Post-op Assessment: Report given to RN and Post -op Vital signs reviewed and stable  Post vital signs: Reviewed and stable  Last Vitals:  Vitals Value Taken Time  BP 128/98 05/01/23 0819  Temp 35.9 0819  Pulse 59 05/01/23 0819  Resp 20 05/01/23 0819  SpO2 95 % 05/01/23 0819  Vitals shown include unfiled device data.  Last Pain:  Vitals:   05/01/23 0710  TempSrc: Temporal  PainSc: 0-No pain         Complications: No notable events documented.

## 2023-05-01 NOTE — Anesthesia Preprocedure Evaluation (Signed)
 Anesthesia Evaluation  Patient identified by MRN, date of birth, ID band Patient awake    Reviewed: Allergy & Precautions, NPO status , Patient's Chart, lab work & pertinent test results  History of Anesthesia Complications (+) PONV and history of anesthetic complications  Airway Mallampati: II  TM Distance: >3 FB Neck ROM: Full    Dental  (+) Edentulous Upper, Chipped, Dental Advidsory Given   Pulmonary shortness of breath and with exertion, neg sleep apnea, COPD,  COPD inhaler, neg recent URI, Patient abstained from smoking.Not current smoker, former smoker Sporadic inhaler use. Breathing today feels a touch below baseline. Lungs CTAB. Will give preop duoneb   Pulmonary exam normal breath sounds clear to auscultation       Cardiovascular Exercise Tolerance: Poor METShypertension, (-) angina + CAD, + Past MI, + Cardiac Stents and +CHF  (-) dysrhythmias (-) Valvular Problems/Murmurs Rhythm:Regular Rate:Normal - Systolic murmurs Last echo on record was at time of her STEMI, EF 40-45%   Neuro/Psych  Headaches PSYCHIATRIC DISORDERS Anxiety Depression       GI/Hepatic ,neg GERD  ,,(+)     (-) substance abuse    Endo/Other  diabetes    Renal/GU negative Renal ROS     Musculoskeletal  (+) Arthritis ,    Abdominal   Peds  Hematology   Anesthesia Other Findings Past Medical History: 02/13/2019: Acute ST elevation myocardial infarction (STEMI) of  anterior wall (HCC)     Comment:  a.) LHC 02/13/2019: 70% m-d LAD. Patent               ostial/proximal/mid RCA stents. SCAD mLAD --> medical               mgmt No date: Anxiety     Comment:  a.) on BZO PRN (alprazolam) No date: Asthma No date: Baker's cyst, left No date: Bronchitis No date: Chronic cough No date: Complex tear of medial meniscus of left knee as current  injury No date: Complication of anesthesia     Comment:  a.) PONV No date: COPD (chronic obstructive  pulmonary disease) (HCC) 01/16/2004: Coronary artery disease     Comment:  a.) LHC/PCI 01/13/2004: 70% oRCA (3.5x8 mm Vision BMS),               50% pRCA 3.5x23 mm Cypher DES), 90% mRCA (3.5x23 mm               Cypher DES); b.) LHC/PCI 03/24/2004: 95% ISR pRCA (3.5x18              Cypher DES); c.) LHC 05/26/2008: pat stents - med mgmt;               d.) LHC 01/18/2011: pat stents - med mgmt; e.) LHC               01/28/2018: 20% ISR o-mRCA, 20% ISR mRCA - med mgmt; f.)               LHC 02/13/2019: 70% m-dLAD. Pat RCA stents. SCAD mLAD --               med mgmt No date: Degenerative tear of lateral meniscus, left No date: Depression 05/2008: Diet-controlled type 2 diabetes mellitus (HCC) No date: Diverticulosis No date: Dyspnea No date: Endometriosis No date: Environmental and seasonal allergies 2023: History of bilateral cataract extraction No date: HLD (hyperlipidemia) No date: Hypertension No date: Long term current use of aspirin No date: Long term current use of clopidogrel No  date: Lower extremity edema No date: Migraine headache No date: Palpitation No date: Pes anserine bursitis No date: Pneumonia No date: PONV (postoperative nausea and vomiting) No date: Primary osteoarthritis of left knee No date: RLS (restless legs syndrome) 02/13/2019: Spontaneous dissection of coronary artery     Comment:  a.) LHC 02/13/2019 in setting of anterior STEMI -->               medical mgmt No date: Tremors of nervous system No date: Unstable angina (HCC) No date: Varicose veins of both lower extremities     Comment:  a.) s/p GSV laser ablation; b.) s/p foam sclerotherapy No date: Wears dentures     Comment:  Partial upper No date: Wheezing  Reproductive/Obstetrics                             Anesthesia Physical Anesthesia Plan  ASA: 3  Anesthesia Plan: General   Post-op Pain Management: Ofirmev IV (intra-op)*   Induction: Intravenous  PONV Risk Score  and Plan: 4 or greater and Propofol infusion, TIVA and Treatment may vary due to age or medical condition  Airway Management Planned: Natural Airway and Nasal Cannula  Additional Equipment: None  Intra-op Plan:   Post-operative Plan: Extubation in OR  Informed Consent: I have reviewed the patients History and Physical, chart, labs and discussed the procedure including the risks, benefits and alternatives for the proposed anesthesia with the patient or authorized representative who has indicated his/her understanding and acceptance.     Dental advisory given  Plan Discussed with: CRNA and Surgeon  Anesthesia Plan Comments: (Discussed risks of anesthesia with patient, including PONV, sore throat, lip/dental/eye damage. Rare risks discussed as well, such as cardiorespiratory and neurological sequelae, and allergic reactions. Discussed the role of CRNA in patient's perioperative care. Patient understands.  Patient has listed allergy to PCN - tongue and throat swelling as a teenager. Severe blistering skin reaction (SJS/TEN)? no Liver or kidney injury caused by PCN? no Hemolytic anemia from PCN? no Drug fever? no Painful swollen joints? no Severe reaction involving inside of mouth, eye, or genital ulcers? no Based on current evidence Elizebeth Koller et al, J Allergy Clin Immunol Pract, 2019), will proceed with cefazolin use: Yes  )        Anesthesia Quick Evaluation

## 2023-05-01 NOTE — Op Note (Signed)
 [facility] Patient Name: Courtney Blanchard Procedure Date: 05/01/2023 7:49 AM MRN: [id number] Account #: [id number] Date of Birth: 05-Apr-1955 Admit Type: Outpatient Age: 68 Room: [facility] ENDO ROOM 1 Gender: Female Note Status: Finalized Instrument Name: Upper Endoscope [id number] Procedure:             Upper GI endoscopy Indications:           Nausea Providers:             Eather Colas MD, MD Referring MD:          Eather Colas MD, MD (Referring MD), Rhona Leavens.                         Burnett Sheng, MD (Referring MD) Medicines:             Monitored Anesthesia Care Complications:         No immediate complications. Estimated blood loss:                         Minimal. Procedure:             Pre-Anesthesia Assessment:                        - Prior to the procedure, a History and Physical was                         performed, and patient medications and allergies were                         reviewed. The patient is competent. The risks and                         benefits of the procedure and the sedation options and                         risks were discussed with the patient. All questions                         were answered and informed consent was obtained.                         Patient identification and proposed procedure were                         verified by the physician, the nurse, the                         anesthesiologist, the anesthetist and the technician                         in the endoscopy suite. Mental Status Examination:                         alert and oriented. Airway Examination: normal                         oropharyngeal airway and neck mobility. Respiratory  Examination: clear to auscultation. CV Examination:                         normal. Prophylactic Antibiotics: The patient does not                         require prophylactic antibiotics. Prior                         Anticoagulants:  The patient has taken Plavix                         (clopidogrel), last dose was 6 days prior to                         procedure. ASA Grade Assessment: III - A patient with                         severe systemic disease. After reviewing the risks and                         benefits, the patient was deemed in satisfactory                         condition to undergo the procedure. The anesthesia                         plan was to use monitored anesthesia care (MAC).                         Immediately prior to administration of medications,                         the patient was re-assessed for adequacy to receive                         sedatives. The heart rate, respiratory rate, oxygen                         saturations, blood pressure, adequacy of pulmonary                         ventilation, and response to care were monitored                         throughout the procedure. The physical status of the                         patient was re-assessed after the procedure.                        After obtaining informed consent, the endoscope was                         passed under direct vision. Throughout the procedure,                         the patient's blood pressure, pulse, and oxygen  saturations were monitored continuously. The Endoscope                         was introduced through the mouth, and advanced to the                         second part of duodenum. The upper GI endoscopy was                         accomplished without difficulty. The patient tolerated                         the procedure well. Findings:      A small hiatal hernia was present.      The exam of the esophagus was otherwise normal.      Evidence of a Roux-en-Y anastomosis was found in the stomach. This was       characterized by healthy appearing mucosa. Biopsies were taken with a       cold forceps for Helicobacter pylori testing. Estimated blood loss was        minimal.      The examined jejunum was normal. Impression:            - Small hiatal hernia.                        - A Roux-en-Y anastomosis was found, characterized by                         healthy appearing mucosa. Biopsied.                        - Normal examined jejunum. Recommendation:        - Discharge patient to home.                        - Resume previous diet.                        - Continue present medications.                        - Resume Plavix (clopidogrel) at prior dose today.                        - Await pathology results. Procedure Code(s):     --- Professional ---                        [phone], Esophagogastroduodenoscopy, flexible,                         transoral; with biopsy, single or multiple Diagnosis Code(s):     --- Professional ---                        K44.9, Diaphragmatic hernia without obstruction or                         gangrene                        Z98.84,  Bariatric surgery status                        R11.0, Nausea CPT copyright 2022 [employer]. All rights reserved. The codes documented in this report are preliminary and upon coder review may  be revised to meet current compliance requirements. Eather Colas MD, MD 05/01/2023 8:19:10 AM Number of Addenda: 0 Note Initiated On: 05/01/2023 7:49 AM Estimated Blood Loss:  Estimated blood loss was minimal.      [facility]

## 2023-05-01 NOTE — Anesthesia Postprocedure Evaluation (Signed)
 Anesthesia Post Note  Patient: Courtney Blanchard  Procedure(s) Performed: ESOPHAGOGASTRODUODENOSCOPY (EGD) WITH PROPOFOL COLONOSCOPY WITH PROPOFOL BIOPSY  Patient location during evaluation: Endoscopy Anesthesia Type: General Level of consciousness: awake and alert Pain management: pain level controlled Vital Signs Assessment: post-procedure vital signs reviewed and stable Respiratory status: spontaneous breathing, nonlabored ventilation, respiratory function stable and patient connected to nasal cannula oxygen Cardiovascular status: blood pressure returned to baseline and stable Postop Assessment: no apparent nausea or vomiting Anesthetic complications: no   No notable events documented.   Last Vitals:  Vitals:   05/01/23 0830 05/01/23 0839  BP: 100/63 119/81  Pulse: 62 (!) 56  Resp: (!) 21 19  Temp:    SpO2: 97% 98%    Last Pain:  Vitals:   05/01/23 0839  TempSrc:   PainSc: 0-No pain                 Lenard Simmer

## 2023-05-01 NOTE — H&P (Signed)
 Outpatient short stay form Pre-procedure 05/01/2023  Courtney Bill, MD  Primary Physician: Jerl Mina, MD  Reason for visit:  Nausea/Colon cancer screening  History of present illness:    68 y/o lady with history of chronic pain on opioids, COPD, and CAD on plavix with last dose 5 days ago here for EGD for chronic nausea and colonoscopy for screening. Last colonoscopy in 2010 was incomplete. Had attempt at [facility] was not able to be done so had partial colectomy but unclear if had f/u colonoscopy. No family history of GI malignancies. Also history of hysterectomy.    Current Facility-Administered Medications:    0.9 %  sodium chloride infusion, , Intravenous, Continuous, Oran Dillenburg, Rossie Muskrat, MD, Last Rate: 20 mL/hr at 05/01/23 0731, New Bag at 05/01/23 0731  Medications Prior to Admission  Medication Sig Dispense Refill Last Dose/Taking   ALPRAZolam (XANAX XR) 1 MG 24 hr tablet Take 1 mg by mouth daily.   05/01/2023 Morning   DULoxetine (CYMBALTA) 20 MG capsule Take 20 mg by mouth 2 (two) times daily.   05/01/2023 Morning   gabapentin (NEURONTIN) 300 MG capsule Take 1 capsule by mouth 3 (three) times daily.   05/01/2023   montelukast (SINGULAIR) 10 MG tablet Take 10 mg by mouth daily.    05/01/2023 Morning   rOPINIRole (REQUIP) 0.25 MG tablet 0.5 mg at bedtime.   04/30/2023   acetaminophen (TYLENOL) 500 MG tablet Take 500-1,000 mg by mouth 2 (two) times daily as needed for moderate pain or headache.       aspirin 81 MG tablet Take 81 mg by mouth as needed.      baclofen (LIORESAL) 10 MG tablet       clopidogrel (PLAVIX) 75 MG tablet Take 1 tablet (75 mg total) by mouth daily. 30 tablet 0 04/25/2023   cyclobenzaprine (FLEXERIL) 5 MG tablet       isosorbide mononitrate (IMDUR) 60 MG 24 hr tablet Take 0.5 tablets (30 mg total) by mouth in the morning and at bedtime.      nitroGLYCERIN (NITROSTAT) 0.4 MG SL tablet Place 1 tablet (0.4 mg total) under the tongue every 5 (five) minutes as needed  for chest pain. 30 tablet 0    oxyCODONE (ROXICODONE) 5 MG immediate release tablet Take 1-2 tablets (5-10 mg total) by mouth every 4 (four) hours as needed for moderate pain or severe pain. 30 tablet 0      Allergies  Allergen Reactions   Acetaminophen-Codeine Nausea And Vomiting   Amoxicillin     Tongue swelling as a teenager   Atorvastatin     Tremors    Penicillin V Potassium Other (See Comments)    UNKNOWN     Past Medical History:  Diagnosis Date   Acute ST elevation myocardial infarction (STEMI) of anterior wall (HCC) 02/13/2019   a.) LHC 02/13/2019: 70% m-d LAD. Patent ostial/proximal/mid RCA stents. SCAD mLAD --> medical mgmt   Anxiety    a.) on BZO PRN (alprazolam)   Asthma    Baker's cyst, left    Bronchitis    Chronic cough    Complex tear of medial meniscus of left knee as current injury    Complication of anesthesia    a.) PONV   COPD (chronic obstructive pulmonary disease) (HCC)    Coronary artery disease 01/16/2004   a.) LHC/PCI 01/13/2004: 70% oRCA (3.5x8 mm Vision BMS), 50% pRCA 3.5x23 mm Cypher DES), 90% mRCA (3.5x23 mm Cypher DES); b.) LHC/PCI 03/24/2004: 95% ISR pRCA (3.5x18 Cypher  DES); c.) LHC 05/26/2008: pat stents - med mgmt; d.) LHC 01/18/2011: pat stents - med mgmt; e.) LHC 01/28/2018: 20% ISR o-mRCA, 20% ISR mRCA - med mgmt; f.) LHC 02/13/2019: 70% m-dLAD. Pat RCA stents. SCAD mLAD -- med mgmt   Degenerative tear of lateral meniscus, left    Depression    Diet-controlled type 2 diabetes mellitus (HCC) 05/2008   Diverticulosis    Dyspnea    Endometriosis    Environmental and seasonal allergies    Heart palpitations    History of bilateral cataract extraction 2023   HLD (hyperlipidemia)    Hypertension    Long term current use of aspirin    Long term current use of clopidogrel    Lower extremity edema    Migraine headache    Palpitation    Pes anserine bursitis    Pneumonia    PONV (postoperative nausea and vomiting)    Primary  osteoarthritis of left knee    RLS (restless legs syndrome)    Spontaneous dissection of coronary artery 02/13/2019   a.) LHC 02/13/2019 in setting of anterior STEMI --> medical mgmt   Tremors of nervous system    Unstable angina (HCC)    Varicose veins of both lower extremities    a.) s/p GSV laser ablation; b.) s/p foam sclerotherapy   Wears dentures    Partial upper   Wheezing     Review of systems:  Otherwise negative.    Physical Exam  Gen: Alert, oriented. Appears stated age.  HEENT: PERRLA. Lungs: No respiratory distress CV: RRR Abd: soft, benign, no masses Ext: No edema    Planned procedures: Proceed with EGD/colonoscopy. The patient understands the nature of the planned procedure, indications, risks, alternatives and potential complications including but not limited to bleeding, infection, perforation, damage to internal organs and possible oversedation/side effects from anesthesia. The patient agrees and gives consent to proceed.  Please refer to procedure notes for findings, recommendations and patient disposition/instructions.     Courtney Bill, MD [facility]enter[facility]

## 2023-05-01 NOTE — Interval H&P Note (Signed)
 History and Physical Interval Note:  05/01/2023 7:48 AM  Courtney Blanchard  has presented today for surgery, with the diagnosis of Chronic Nausea Colon Cancer Screening.  The various methods of treatment have been discussed with the patient and family. After consideration of risks, benefits and other options for treatment, the patient has consented to  Procedure(s): ESOPHAGOGASTRODUODENOSCOPY (EGD) WITH PROPOFOL (N/A) COLONOSCOPY WITH PROPOFOL (N/A) as a surgical intervention.  The patient's history has been reviewed, patient examined, no change in status, stable for surgery.  I have reviewed the patient's chart and labs.  Questions were answered to the patient's satisfaction.     Regis Bill  Ok to proceed with EGD/Colonoscopy

## 2023-05-02 LAB — SURGICAL PATHOLOGY

## 2023-05-18 DIAGNOSIS — E538 Deficiency of other specified B group vitamins: Secondary | ICD-10-CM | POA: Diagnosis not present

## 2023-05-29 DIAGNOSIS — Z961 Presence of intraocular lens: Secondary | ICD-10-CM | POA: Diagnosis not present

## 2023-05-29 DIAGNOSIS — Z0100 Encounter for examination of eyes and vision without abnormal findings: Secondary | ICD-10-CM | POA: Diagnosis not present

## 2023-05-29 DIAGNOSIS — E119 Type 2 diabetes mellitus without complications: Secondary | ICD-10-CM | POA: Diagnosis not present

## 2023-05-31 DIAGNOSIS — M79604 Pain in right leg: Secondary | ICD-10-CM | POA: Diagnosis not present

## 2023-06-02 DIAGNOSIS — M79604 Pain in right leg: Secondary | ICD-10-CM | POA: Diagnosis not present

## 2023-06-08 DIAGNOSIS — E1169 Type 2 diabetes mellitus with other specified complication: Secondary | ICD-10-CM | POA: Diagnosis not present

## 2023-06-08 DIAGNOSIS — M1711 Unilateral primary osteoarthritis, right knee: Secondary | ICD-10-CM | POA: Diagnosis not present

## 2023-07-02 DIAGNOSIS — M47816 Spondylosis without myelopathy or radiculopathy, lumbar region: Secondary | ICD-10-CM | POA: Diagnosis not present

## 2023-07-02 DIAGNOSIS — M170 Bilateral primary osteoarthritis of knee: Secondary | ICD-10-CM | POA: Diagnosis not present

## 2023-07-11 ENCOUNTER — Other Ambulatory Visit: Payer: Self-pay | Admitting: Orthopedic Surgery

## 2023-07-11 DIAGNOSIS — M2391 Unspecified internal derangement of right knee: Secondary | ICD-10-CM

## 2023-07-11 DIAGNOSIS — M5441 Lumbago with sciatica, right side: Secondary | ICD-10-CM | POA: Diagnosis not present

## 2023-07-11 DIAGNOSIS — M1711 Unilateral primary osteoarthritis, right knee: Secondary | ICD-10-CM

## 2023-07-11 DIAGNOSIS — Z6833 Body mass index (BMI) 33.0-33.9, adult: Secondary | ICD-10-CM | POA: Diagnosis not present

## 2023-07-11 DIAGNOSIS — M170 Bilateral primary osteoarthritis of knee: Secondary | ICD-10-CM | POA: Diagnosis not present

## 2023-07-11 DIAGNOSIS — E1169 Type 2 diabetes mellitus with other specified complication: Secondary | ICD-10-CM | POA: Diagnosis not present

## 2023-07-11 DIAGNOSIS — M5442 Lumbago with sciatica, left side: Secondary | ICD-10-CM | POA: Diagnosis not present

## 2023-07-11 DIAGNOSIS — G8929 Other chronic pain: Secondary | ICD-10-CM | POA: Diagnosis not present

## 2023-07-15 ENCOUNTER — Ambulatory Visit
Admission: RE | Admit: 2023-07-15 | Discharge: 2023-07-15 | Disposition: A | Discharge status: Home / Self Care | Source: Ambulatory Visit | Attending: Orthopedic Surgery | Admitting: Orthopedic Surgery

## 2023-07-15 DIAGNOSIS — M1711 Unilateral primary osteoarthritis, right knee: Secondary | ICD-10-CM | POA: Diagnosis not present

## 2023-07-15 DIAGNOSIS — M7121 Synovial cyst of popliteal space [Baker], right knee: Secondary | ICD-10-CM | POA: Diagnosis not present

## 2023-07-15 DIAGNOSIS — M25461 Effusion, right knee: Secondary | ICD-10-CM | POA: Diagnosis not present

## 2023-07-15 DIAGNOSIS — S83511A Sprain of anterior cruciate ligament of right knee, initial encounter: Secondary | ICD-10-CM | POA: Diagnosis not present

## 2023-07-15 DIAGNOSIS — M2391 Unspecified internal derangement of right knee: Secondary | ICD-10-CM | POA: Insufficient documentation

## 2023-07-15 DIAGNOSIS — S83231A Complex tear of medial meniscus, current injury, right knee, initial encounter: Secondary | ICD-10-CM | POA: Diagnosis not present

## 2023-07-31 ENCOUNTER — Encounter (INDEPENDENT_AMBULATORY_CARE_PROVIDER_SITE_OTHER): Payer: Self-pay

## 2023-08-02 DIAGNOSIS — M170 Bilateral primary osteoarthritis of knee: Secondary | ICD-10-CM | POA: Diagnosis not present

## 2023-08-02 DIAGNOSIS — M5417 Radiculopathy, lumbosacral region: Secondary | ICD-10-CM | POA: Diagnosis not present

## 2023-08-02 DIAGNOSIS — M1711 Unilateral primary osteoarthritis, right knee: Secondary | ICD-10-CM | POA: Diagnosis not present

## 2023-08-10 DIAGNOSIS — E538 Deficiency of other specified B group vitamins: Secondary | ICD-10-CM | POA: Diagnosis not present

## 2023-08-20 DIAGNOSIS — M1711 Unilateral primary osteoarthritis, right knee: Secondary | ICD-10-CM | POA: Diagnosis not present

## 2023-08-20 DIAGNOSIS — S83231A Complex tear of medial meniscus, current injury, right knee, initial encounter: Secondary | ICD-10-CM | POA: Diagnosis not present

## 2023-08-21 DIAGNOSIS — G8929 Other chronic pain: Secondary | ICD-10-CM | POA: Diagnosis not present

## 2023-08-21 DIAGNOSIS — M5416 Radiculopathy, lumbar region: Secondary | ICD-10-CM | POA: Diagnosis not present

## 2023-08-21 DIAGNOSIS — M5441 Lumbago with sciatica, right side: Secondary | ICD-10-CM | POA: Diagnosis not present

## 2023-09-04 DIAGNOSIS — Z0000 Encounter for general adult medical examination without abnormal findings: Secondary | ICD-10-CM | POA: Diagnosis not present

## 2023-09-04 DIAGNOSIS — I252 Old myocardial infarction: Secondary | ICD-10-CM | POA: Diagnosis not present

## 2023-09-04 DIAGNOSIS — Z1331 Encounter for screening for depression: Secondary | ICD-10-CM | POA: Diagnosis not present

## 2023-09-04 DIAGNOSIS — I10 Essential (primary) hypertension: Secondary | ICD-10-CM | POA: Diagnosis not present

## 2023-09-04 DIAGNOSIS — E785 Hyperlipidemia, unspecified: Secondary | ICD-10-CM | POA: Diagnosis not present

## 2023-09-04 DIAGNOSIS — E119 Type 2 diabetes mellitus without complications: Secondary | ICD-10-CM | POA: Diagnosis not present

## 2023-09-04 DIAGNOSIS — Z01818 Encounter for other preprocedural examination: Secondary | ICD-10-CM | POA: Diagnosis not present

## 2023-09-04 DIAGNOSIS — E669 Obesity, unspecified: Secondary | ICD-10-CM | POA: Diagnosis not present

## 2023-09-04 DIAGNOSIS — M25561 Pain in right knee: Secondary | ICD-10-CM | POA: Diagnosis not present

## 2023-09-04 DIAGNOSIS — J449 Chronic obstructive pulmonary disease, unspecified: Secondary | ICD-10-CM | POA: Diagnosis not present

## 2023-09-21 ENCOUNTER — Other Ambulatory Visit: Payer: Self-pay | Admitting: Surgery

## 2023-09-25 ENCOUNTER — Encounter: Payer: Self-pay | Admitting: Urgent Care

## 2023-09-25 ENCOUNTER — Other Ambulatory Visit: Payer: Self-pay

## 2023-09-25 ENCOUNTER — Encounter
Admission: RE | Admit: 2023-09-25 | Discharge: 2023-09-25 | Disposition: A | Discharge status: Home / Self Care | Source: Ambulatory Visit | Attending: Surgery | Admitting: Surgery

## 2023-09-25 VITALS — BP 117/74 | HR 80 | Resp 16 | Ht 62.0 in | Wt 181.0 lb

## 2023-09-25 DIAGNOSIS — Z01812 Encounter for preprocedural laboratory examination: Secondary | ICD-10-CM | POA: Diagnosis not present

## 2023-09-25 DIAGNOSIS — R8290 Unspecified abnormal findings in urine: Secondary | ICD-10-CM | POA: Diagnosis not present

## 2023-09-25 DIAGNOSIS — M179 Osteoarthritis of knee, unspecified: Secondary | ICD-10-CM

## 2023-09-25 DIAGNOSIS — R8281 Pyuria: Secondary | ICD-10-CM | POA: Insufficient documentation

## 2023-09-25 DIAGNOSIS — E119 Type 2 diabetes mellitus without complications: Secondary | ICD-10-CM

## 2023-09-25 HISTORY — DX: Unilateral primary osteoarthritis, right knee: M17.11

## 2023-09-25 HISTORY — DX: Complex tear of medial meniscus, current injury, right knee, initial encounter: S83.231A

## 2023-09-25 LAB — URINALYSIS, ROUTINE W REFLEX MICROSCOPIC
Bacteria, UA: NONE SEEN
Bilirubin Urine: NEGATIVE
Glucose, UA: NEGATIVE mg/dL
Hgb urine dipstick: NEGATIVE
Ketones, ur: NEGATIVE mg/dL
Nitrite: NEGATIVE
Protein, ur: NEGATIVE mg/dL
Specific Gravity, Urine: 1.024 (ref 1.005–1.030)
pH: 5 (ref 5.0–8.0)

## 2023-09-25 LAB — SURGICAL PCR SCREEN
MRSA, PCR: NEGATIVE
Staphylococcus aureus: NEGATIVE

## 2023-09-25 NOTE — Patient Instructions (Addendum)
 Your procedure is scheduled on: 10/04/23 - Thursday Report to the Registration Desk on the 1st floor of the [facility]. To find out your arrival time, please call [phone] between 1PM - 3PM on: 10/03/23 - Wednesday If your arrival time is 6:00 am, do not arrive before that time as the [facility] entrance doors do not open until 6:00 am.  REMEMBER: Instructions that are not followed completely may result in serious medical risk, up to and including death; or upon the discretion of your surgeon and anesthesiologist your surgery may need to be rescheduled.  Do not eat food after midnight the night before surgery.  No gum chewing or hard candies.  You may however, drink CLEAR liquids up to 2 hours before you are scheduled to arrive for your surgery. Do not drink anything within 2 hours of your scheduled arrival time.  Clear liquids include: - water   In addition, your doctor has ordered for you to drink the provided:  Gatorade G2 Drinking this carbohydrate drink up to two hours before surgery helps to reduce insulin resistance and improve patient outcomes. Please complete drinking 2 hours before scheduled arrival time.  One week prior to surgery: Stop Anti-inflammatories (NSAIDS) such as Advil, Aleve, Ibuprofen, Motrin, Naproxen, Naprosyn and Aspirin  based products such as Excedrin, Goody's Powder, BC Powder. You may continue to take Tylenol  if needed for pain up until the day of surgery.  Stop ANY OVER THE COUNTER supplements until after surgery.  HOLD tirzepatide (MOUNJARO) 7 days prior to your surgery.  HOLD Plavix  5 days prior beginning 09/29/23, resume with doctors instructions.   HOLD Aspirin  5 days prior beginning 09/29/23, may resume with doctors instructions.  ON THE DAY OF SURGERY ONLY TAKE THESE MEDICATIONS WITH SIPS OF WATER:  ALPRAZolam  (XANAX  XR) if needed DULoxetine  (CYMBALTA )  isosorbide  mononitrate (IMDUR )  montelukast  (SINGULAIR )  oxyCODONE  (ROXICODONE ) if  needed   Use inhalers albuterol  (VENTOLIN  HFA) on the day of surgery and bring to the hospital.  No Alcohol for 24 hours before or after surgery.  No Smoking including e-cigarettes for 24 hours before surgery.  No chewable tobacco products for at least 6 hours before surgery.  No nicotine patches on the day of surgery.  Do not use any recreational drugs for at least a week (preferably 2 weeks) before your surgery.  Please be advised that the combination of cocaine and anesthesia may have negative outcomes, up to and including death. If you test positive for cocaine, your surgery will be cancelled.  On the morning of surgery brush your teeth with toothpaste and water, you may rinse your mouth with mouthwash if you wish. Do not swallow any toothpaste or mouthwash.  Use CHG Soap or wipes as directed on instruction sheet.  Do not wear jewelry, make-up, hairpins, clips or nail polish.  For welded (permanent) jewelry: bracelets, anklets, waist bands, etc.  Please have this removed prior to surgery.  If it is not removed, there is a chance that hospital personnel will need to cut it off on the day of surgery.  Do not wear lotions, powders, or perfumes.   Do not shave body hair from the neck down 48 hours before surgery.  Contact lenses, hearing aids and dentures may not be worn into surgery.  Do not bring valuables to the hospital. [facility] is not responsible for any missing/lost belongings or valuables.   Notify your doctor if there is any change in your medical condition (cold, fever, infection).  Wear  comfortable clothing (specific to your surgery type) to the hospital.  After surgery, you can help prevent lung complications by doing breathing exercises.  Take deep breaths and cough every 1-2 hours. Your doctor may order a device called an Incentive Spirometer to help you take deep breaths.  When coughing or sneezing, hold a pillow firmly against your incision with both hands.  This is called "splinting." Doing this helps protect your incision. It also decreases belly discomfort.  If you are being admitted to the hospital overnight, leave your suitcase in the car. After surgery it may be brought to your room.  In case of increased patient census, it may be necessary for you, the patient, to continue your postoperative care in the Same Day Surgery department.  If you are being discharged the day of surgery, you will not be allowed to drive home. You will need a responsible individual to drive you home and stay with you for 24 hours after surgery.   If you are taking public transportation, you will need to have a responsible individual with you.  Please call the Pre-admissions Testing Dept. at [phone] if you have any questions about these instructions.  Surgery Visitation Policy:  Patients having surgery or a procedure may have two visitors.  Children under the age of 60 must have an adult with them who is not the patient.  Inpatient Visitation:    Visiting hours are 7 a.m. to 8 p.m. Up to four visitors are allowed at one time in a patient room. The visitors may rotate out with other people during the day.  One visitor age 75 or older may stay with the patient overnight and must be in the room by 8 p.m.   Merchandiser, retail to address health-related social needs:  [email or web address]    Pre-operative 5 CHG Bath Instructions   You can play a key role in reducing the risk of infection after surgery. Your skin needs to be as free of germs as possible. You can reduce the number of germs on your skin by washing with CHG (chlorhexidine  gluconate) soap before surgery. CHG is an antiseptic soap that kills germs and continues to kill germs even after washing.   DO NOT use if you have an allergy to chlorhexidine /CHG or antibacterial soaps. If your skin becomes reddened or irritated, stop using the CHG and notify one of our RNs at  [phone].   Please shower with the CHG soap starting 4 days before surgery using the following schedule: 07/20 - 07/24.    Please keep in mind the following:  DO NOT shave, including legs and underarms, starting the day of your first shower.   You may shave your face at any point before/day of surgery.  Place clean sheets on your bed the day you start using CHG soap. Use a clean washcloth (not used since being washed) for each shower. DO NOT sleep with pets once you start using the CHG.   CHG Shower Instructions:  If you choose to wash your hair and private area, wash first with your normal shampoo/soap.  After you use shampoo/soap, rinse your hair and body thoroughly to remove shampoo/soap residue.  Turn the water OFF and apply about 3 tablespoons (45 ml) of CHG soap to a CLEAN washcloth.  Apply CHG soap ONLY FROM YOUR NECK DOWN TO YOUR TOES (washing for 3-5 minutes)  DO NOT use CHG soap on face, private areas, open wounds, or sores.  Pay special attention to the area  where your surgery is being performed.  If you are having back surgery, having someone wash your back for you may be helpful. Wait 2 minutes after CHG soap is applied, then you may rinse off the CHG soap.  Pat dry with a clean towel  Put on clean clothes/pajamas   If you choose to wear lotion, please use ONLY the CHG-compatible lotions on the back of this paper.     Additional instructions for the day of surgery: DO NOT APPLY any lotions, deodorants, cologne, or perfumes.   Put on clean/comfortable clothes.  Brush your teeth.  Ask your nurse before applying any prescription medications to the skin.      CHG Compatible Lotions   Aveeno Moisturizing lotion  Cetaphil Moisturizing Cream  Cetaphil Moisturizing Lotion  Clairol Herbal Essence Moisturizing Lotion, Dry Skin  Clairol Herbal Essence Moisturizing Lotion, Extra Dry Skin  Clairol Herbal Essence Moisturizing Lotion, Normal Skin  Curel Age Defying  Therapeutic Moisturizing Lotion with Alpha Hydroxy  Curel Extreme Care Body Lotion  Curel Soothing Hands Moisturizing Hand Lotion  Curel Therapeutic Moisturizing Cream, Fragrance-Free  Curel Therapeutic Moisturizing Lotion, Fragrance-Free  Curel Therapeutic Moisturizing Lotion, Original Formula  Eucerin Daily Replenishing Lotion  Eucerin Dry Skin Therapy Plus Alpha Hydroxy Crme  Eucerin Dry Skin Therapy Plus Alpha Hydroxy Lotion  Eucerin Original Crme  Eucerin Original Lotion  Eucerin Plus Crme Eucerin Plus Lotion  Eucerin TriLipid Replenishing Lotion  Keri Anti-Bacterial Hand Lotion  Keri Deep Conditioning Original Lotion Dry Skin Formula Softly Scented  Keri Deep Conditioning Original Lotion, Fragrance Free Sensitive Skin Formula  Keri Lotion Fast Absorbing Fragrance Free Sensitive Skin Formula  Keri Lotion Fast Absorbing Softly Scented Dry Skin Formula  Keri Original Lotion  Keri Skin Renewal Lotion Keri Silky Smooth Lotion  Keri Silky Smooth Sensitive Skin Lotion  Nivea Body Creamy Conditioning Oil  Nivea Body Extra Enriched Lotion  Nivea Body Original Lotion  Nivea Body Sheer Moisturizing Lotion Nivea Crme  Nivea Skin Firming Lotion  NutraDerm 30 Skin Lotion  NutraDerm Skin Lotion  NutraDerm Therapeutic Skin Cream  NutraDerm Therapeutic Skin Lotion  ProShield Protective Hand Cream  Provon moisturizing lotion   How to Use an Incentive Spirometer  An incentive spirometer is a tool that measures how well you are filling your lungs with each breath. Learning to take long, deep breaths using this tool can help you keep your lungs clear and active. This may help to reverse or lessen your chance of developing breathing (pulmonary) problems, especially infection. You may be asked to use a spirometer: After a surgery. If you have a lung problem or a history of smoking. After a long period of time when you have been unable to move or be active. If the spirometer includes an  indicator to show the highest number that you have reached, your health care provider or respiratory therapist will help you set a goal. Keep a log of your progress as told by your health care provider. What are the risks? Breathing too quickly may cause dizziness or cause you to pass out. Take your time so you do not get dizzy or light-headed. If you are in pain, you may need to take pain medicine before doing incentive spirometry. It is harder to take a deep breath if you are having pain. How to use your incentive spirometer  Sit up on the edge of your bed or on a chair. Hold the incentive spirometer so that it is in an upright position. Before you  use the spirometer, breathe out normally. Place the mouthpiece in your mouth. Make sure your lips are closed tightly around it. Breathe in slowly and as deeply as you can through your mouth, causing the piston or the ball to rise toward the top of the chamber. Hold your breath for 3-5 seconds, or for as long as possible. If the spirometer includes a coach indicator, use this to guide you in breathing. Slow down your breathing if the indicator goes above the marked areas. Remove the mouthpiece from your mouth and breathe out normally. The piston or ball will return to the bottom of the chamber. Rest for a few seconds, then repeat the steps 10 or more times. Take your time and take a few normal breaths between deep breaths so that you do not get dizzy or light-headed. Do this every 1-2 hours when you are awake. If the spirometer includes a goal marker to show the highest number you have reached (best effort), use this as a goal to work toward during each repetition. After each set of 10 deep breaths, cough a few times. This will help to make sure that your lungs are clear. If you have an incision on your chest or abdomen from surgery, place a pillow or a rolled-up towel firmly against the incision when you cough. This can help to reduce pain while  taking deep breaths and coughing. General tips When you are able to get out of bed: Walk around often. Continue to take deep breaths and cough in order to clear your lungs. Keep using the incentive spirometer until your health care provider says it is okay to stop using it. If you have been in the hospital, you may be told to keep using the spirometer at home. Contact a health care provider if: You are having difficulty using the spirometer. You have trouble using the spirometer as often as instructed. Your pain medicine is not giving enough relief for you to use the spirometer as told. You have a fever. Get help right away if: You develop shortness of breath. You develop a cough with bloody mucus from the lungs. You have fluid or blood coming from an incision site after you cough. Summary An incentive spirometer is a tool that can help you learn to take long, deep breaths to keep your lungs clear and active. You may be asked to use a spirometer after a surgery, if you have a lung problem or a history of smoking, or if you have been inactive for a long period of time. Use your incentive spirometer as instructed every 1-2 hours while you are awake. If you have an incision on your chest or abdomen, place a pillow or a rolled-up towel firmly against your incision when you cough. This will help to reduce pain. Get help right away if you have shortness of breath, you cough up bloody mucus, or blood comes from your incision when you cough. This information is not intended to replace advice given to you by your health care provider. Make sure you discuss any questions you have with your health care provider. Document Revised: 05/19/2019 Document Reviewed: 05/19/2019 Elsevier Patient Education  2023 [employer].

## 2023-09-26 DIAGNOSIS — I25118 Atherosclerotic heart disease of native coronary artery with other forms of angina pectoris: Secondary | ICD-10-CM | POA: Diagnosis not present

## 2023-09-26 DIAGNOSIS — E1169 Type 2 diabetes mellitus with other specified complication: Secondary | ICD-10-CM | POA: Diagnosis not present

## 2023-09-26 DIAGNOSIS — I83813 Varicose veins of bilateral lower extremities with pain: Secondary | ICD-10-CM | POA: Diagnosis not present

## 2023-09-26 DIAGNOSIS — R002 Palpitations: Secondary | ICD-10-CM | POA: Diagnosis not present

## 2023-09-26 DIAGNOSIS — I83811 Varicose veins of right lower extremities with pain: Secondary | ICD-10-CM | POA: Diagnosis not present

## 2023-09-26 DIAGNOSIS — I2102 ST elevation (STEMI) myocardial infarction involving left anterior descending coronary artery: Secondary | ICD-10-CM | POA: Diagnosis not present

## 2023-09-26 DIAGNOSIS — I10 Essential (primary) hypertension: Secondary | ICD-10-CM | POA: Diagnosis not present

## 2023-09-26 DIAGNOSIS — Z01810 Encounter for preprocedural cardiovascular examination: Secondary | ICD-10-CM | POA: Diagnosis not present

## 2023-09-26 DIAGNOSIS — Z955 Presence of coronary angioplasty implant and graft: Secondary | ICD-10-CM | POA: Diagnosis not present

## 2023-09-27 ENCOUNTER — Ambulatory Visit: Payer: Self-pay | Admitting: Urgent Care

## 2023-09-27 LAB — URINE CULTURE: Culture: <10000 — AB

## 2023-09-28 ENCOUNTER — Encounter: Payer: Self-pay | Admitting: Surgery

## 2023-09-28 NOTE — Progress Notes (Incomplete)
 Perioperative / Anesthesia Services  Pre-Admission Testing Clinical Review / Pre-Operative Anesthesia Consult  Date: 09/28/23  PATIENT DEMOGRAPHICS: Name: Courtney Blanchard DOB: Oct 26, 1955 MRN:   [id number]  Note: Available PAT nursing documentation and vital signs have been reviewed. Clinical nursing staff has updated patient's PMH/PSHx, current medication list, and drug allergies/intolerances to ensure complete and comprehensive history available to assist care teams in MDM as it pertains to the aforementioned surgical procedure and anticipated anesthetic course. Extensive review of available clinical information personally performed. [place] PMH and PSHx updated with any diagnoses/procedures that  may have been inadvertently omitted during his intake with the pre-admission testing department's nursing staff.  PLANNED SURGICAL PROCEDURE(S):   Case: [id number] Date/Time: 10/04/23 0715   Procedure: ARTHROPLASTY, KNEE, TOTAL (Right: Knee)   Anesthesia type: Choice   Diagnosis: Primary osteoarthritis of right knee [M17.11]   Pre-op diagnosis: Primary osteoarthritis of right knee   Location: [facility] OR ROOM 02 / [facility]   Surgeons: Edie Norleen PARAS, MD        CLINICAL DISCUSSION: Courtney Blanchard is a 68 y.o. female who is submitted for pre-surgical anesthesia review and clearance prior to her undergoing the above procedure.  Patient has never been a smoker. Pertinent PMH includes: CAD, anterior STEMI, SCAD (mLAD), palpitations, unstable angina, HTN, HLD, T2DM, DOE, COPD, asthma, tremors, right BILATERAL lower extremity edema, OA, tremors, BILATERAL lower extremity venous varicosities, RLS, depression, anxiety (on BZO).   Patient is followed by cardiology Garnell, MD). She was last seen in the cardiology clinic on ***; notes reviewed. ***At the time of her clinic visit, patient doing well overall from a cardiovascular perspective. Patient denied any chest pain, shortness  of breath, PND, orthopnea, palpitations, significant peripheral edema, weakness, fatigue, vertiginous symptoms, or presyncope/syncope. Patient with a past medical history significant for cardiovascular diagnoses. Documented physical exam was grossly benign, providing no evidence of acute exacerbation and/or decompensation of the patient's known cardiovascular conditions.  Diagnostic LEFT heart catheterization was performed on 01/13/2004 revealing extensive single-vessel CAD; 70% ostial RCA, 50% proximal RCA, and 90% mid RCA.  PCI was subsequently performed placing a 3.5 x 8 mm Vision BMS x 1 to the ostial RCA, a 3.5 x 23 mm Cypher DES x 1 to the proximal RCA, and a 3.5 x 23 mm Cypher DES x 1 to the mid RCA.  Procedure yielded excellent angiographic result and TIMI-3 flow.   Patient with recurrent angina necessitating repeat diagnostic LEFT heart catheterization on 03/24/2004.  95% in-stent restenosis noted of the previously placed stent to the proximal RCA.  PCI was performed placing a 3.5 x 18 mm Cypher DES x 1 to the proximal RCA.  Procedure yielded excellent angiographic result and TIMI-3 flow.   Diagnostic LEFT heart catheterizations performed on 05/26/2008 and 01/18/2011 revealed stable coronary anatomy and no evidence of in-stent restenosis or further obstructive coronary artery disease.   Most recent myocardial perfusion imaging study was performed on 09/01/2016 revealing a moderately decreased left ventricular systolic function with an EF of 34%.  EF felt to be depressed secondary to GI uptake artifact.  There was no evidence of stress-induced myocardial ischemia or arrhythmia; no scintigraphic evidence of scar.  Study determined to be low risk.   Diagnostic LEFT heart catheterization performed on 01/28/2018 revealed 20% in-stent restenosis of the ostial-mid RCA and 20% in-stent restenosis of the mid RCA.  Given the nonobstructive nature of the patient's coronary artery disease, interventional  cardiology made the decision to defer  further intervention opting for medical management.   Patient suffered an anterior wall STEMI on 02/13/2019.     TTE was performed on 02/13/2019 revealing a mildly reduced left ventricular systolic function with an EF of 40-45%.  Apical hypokinesis noted. There was trivial tricuspid valve regurgitation.  PASP elevated at 34.2 mmHg.  Transvalvular gradients were noted to be normal providing no evidence suggestive of valvular stenosis.  Aorta was normal in size with no evidence of aneurysmal dilatation.   Diagnostic LEFT heart catheterization revealing 70% stenosis of the mid to distal LAD.  SCAD noted to the mid LAD, and felt to be the etiology of patient's coronary event.  Coronary stents in the patient's ostial, proximal, and mid RCA is all noted to be widely patent.  No further intervention was deemed necessary at that time.  Blood pressure***controlled at *** mmHg on currently prescribed *** therapies.  Patient is on *** for her HLD diagnosis and ASCVD prevention. ***Patient is not diabetic. ***She does not have an OSAH diagnosis. ***FC. No changes were made to her medication regimen during her visit with cardiology.  Patient scheduled to follow-up with outpatient cardiology in***months or sooner if needed.  Courtney Blanchard is scheduled for an elective ARTHROPLASTY, KNEE, TOTAL (Right: Knee) on 10/04/2023 with Dr. Norleen Maltos, MD.  Given patient's past medical history significant for cardiovascular diagnoses, presurgical cardiac clearance was sought by the PAT team. ***   Again, this patient is on daily DAPT therapy using ASA + clopidogrel .  Patient has been instructed on recommendations from her cardiologist for holding these medications for 5 days prior to her procedure with plans to restart postoperatively when bleeding risk felt to be minimized by his primary attending surgeon.  The patient is aware that his last dose of her DAPT medications should be on  09/28/2023.  Patient reports previous perioperative complications with anesthesia in the past. Patient has a PMH (+) for PONV. Symptoms and history of PONV will be discussed with patient by anesthesia team on the day of her procedure. Interventions will be ordered as deemed necessary based on patient's individual care needs as determined by anesthesiologist. In review her EMR, it is noted that patient underwent a general anesthetic course here at [facility] ([facility]) in 04/2023 without documented complications.   MOST RECENT VITAL SIGNS:    09/25/2023   12:40 PM 05/01/2023    8:39 AM 05/01/2023    8:30 AM  Vitals with BMI  Height 5' 2    Weight 181 lbs    BMI 33.1    Systolic 117 119 899  Diastolic 74 81 63  Pulse 80 56 62   PROVIDERS/SPECIALISTS: NOTE: Primary physician provider listed below. Patient may have been seen by APP or partner within same practice.   PROVIDER ROLE / SPECIALTY LAST OV  Poggi, Norleen PARAS, MD Orthopedics (Surgeon) 08/20/2023  Valora Agent, MD Primary Care Provider 09/04/2023  Ammon Blunt, MD Cardiology 09/26/2023  Dodson Nest, MD Physiatry 08/21/2023   ALLERGIES: Allergies  Allergen Reactions   Acetaminophen -Codeine Nausea And Vomiting   Amoxicillin     Tongue swelling as a teenager   Atorvastatin      Tremors    Penicillin V Potassium Other (See Comments)    UNKNOWN    CURRENT HOME MEDICATIONS: No current facility-administered medications for this encounter.    acetaminophen  (TYLENOL ) 500 MG tablet   albuterol  (VENTOLIN  HFA) 108 (90 Base) MCG/ACT inhaler   ALPRAZolam  (XANAX  XR) 1 MG 24  hr tablet   aspirin  81 MG tablet   clopidogrel  (PLAVIX ) 75 MG tablet   DULoxetine  (CYMBALTA ) 20 MG capsule   gabapentin  (NEURONTIN ) 300 MG capsule   isosorbide  mononitrate (IMDUR ) 30 MG 24 hr tablet   montelukast  (SINGULAIR ) 10 MG tablet   nitroGLYCERIN  (NITROSTAT ) 0.4 MG SL tablet   oxyCODONE  (ROXICODONE ) 5 MG  immediate release tablet   rOPINIRole (REQUIP) 0.25 MG tablet   tiZANidine (ZANAFLEX) 2 MG tablet   cyanocobalamin  (VITAMIN B12) 1000 MCG/ML injection   isosorbide  mononitrate (IMDUR ) 60 MG 24 hr tablet   tirzepatide (MOUNJARO) 2.5 MG/0.5ML Pen   HISTORY: Past Medical History:  Diagnosis Date   Acute ST elevation myocardial infarction (STEMI) of anterior wall (HCC) 02/13/2019   a.) LHC 02/13/2019: 70% m-d LAD. Patent ostial/proximal/mid RCA stents. SCAD mLAD --> medical mgmt   Angina pectoris (HCC)    Anxiety    a.) on BZO PRN (alprazolam )   Arthritis of both knees    Asthma    Baker's cyst, left    Bronchitis    Chronic cough    Complication of anesthesia    a.) PONV   COPD (chronic obstructive pulmonary disease) (HCC)    Coronary artery disease 01/16/2004   a.) LHC/PCI 01/13/2004: 70% oRCA (3.5x8 mm Vision BMS), 50% pRCA 3.5x23 mm Cypher DES), 90% mRCA (3.5x23 mm Cypher DES); b.) LHC/PCI 03/24/2004: 95% ISR pRCA (3.5x18 Cypher DES); c.) LHC 05/26/2008: pat stents - med mgmt; d.) LHC 01/18/2011: pat stents - med mgmt; e.) LHC 01/28/2018: 20% ISR o-mRCA, 20% ISR mRCA - med mgmt; f.) LHC 02/13/2019: 70% m-dLAD. Pat RCA stents. SCAD mLAD -- med mgmt   Degenerative tear of lateral meniscus, left    Degenerative tear of meniscus of right knee    Depression    Diet-controlled type 2 diabetes mellitus (HCC) 05/2008   Diverticulosis    Dyspnea on exertion    Endometriosis    Environmental and seasonal allergies    Heart palpitations    History of bariatric surgery 2014   History of bilateral cataract extraction 2023   HLD (hyperlipidemia)    Hypertension    Long term current use of aspirin     Long term current use of clopidogrel     Lower extremity edema    Migraine headache    Palpitation    Pes anserine bursitis    Pneumonia    PONV (postoperative nausea and vomiting)    RLS (restless legs syndrome)    a.) takes ropinirole   Spontaneous dissection of coronary artery  02/13/2019   a.) LHC 02/13/2019 in setting of anterior STEMI --> medical mgmt   Statin intolerance    Tremors of nervous system    Unstable angina (HCC)    Varicose veins of both lower extremities    a.) s/p GSV laser ablation; b.) s/p foam sclerotherapy   Wears dentures (partial upper)    Wheezing    Past Surgical History:  Procedure Laterality Date   ABDOMINAL HYSTERECTOMY     BARIATRIC SURGERY     BIOPSY  05/01/2023   Procedure: BIOPSY;  Surgeon: Maryruth Ole DASEN, MD;  Location: [facility];  Service: Endoscopy;;   CATARACT EXTRACTION W/PHACO Left 03/30/2021   Procedure: CATARACT EXTRACTION PHACO AND INTRAOCULAR LENS PLACEMENT (IOC) LEFT DIABETIC;  Surgeon: Mittie Gaskin, MD;  Location: [facility];  Service: Ophthalmology;  Laterality: Left;  3.39 0:31.6   CATARACT EXTRACTION W/PHACO Right 04/13/2021   Procedure: CATARACT EXTRACTION PHACO AND INTRAOCULAR LENS PLACEMENT (IOC) RIGHT  DIABETIC;  Surgeon: Mittie Gaskin, MD;  Location: [facility];  Service: Ophthalmology;  Laterality: Right;  3.31 00:42.0   CHOLECYSTECTOMY     COLON SURGERY     COLONOSCOPY     COLONOSCOPY WITH PROPOFOL  N/A 05/01/2023   Procedure: COLONOSCOPY WITH PROPOFOL ;  Surgeon: Maryruth Ole DASEN, MD;  Location: [facility];  Service: Endoscopy;  Laterality: N/A;   CORONARY ANGIOPLASTY WITH STENT PLACEMENT Left 01/13/2004   Procedure: CORONARY ANGIOPLASTY WITH STENT PLACEMENT; Location: [facility]; Surgeon: Marsa Dooms, MD   CORONARY ANGIOPLASTY WITH STENT PLACEMENT Left 03/24/2004   Procedure: CORONARY ANGIOPLASTY WITH STENT PLACEMENT; Location: [facility]; Surgeon: Marsa Dooms, MD   ECTOPIC PREGNANCY SURGERY     ESOPHAGOGASTRODUODENOSCOPY (EGD) WITH PROPOFOL  N/A 05/01/2023   Procedure: ESOPHAGOGASTRODUODENOSCOPY (EGD) WITH PROPOFOL ;  Surgeon: Maryruth Ole DASEN, MD;  Location: [facility];  Service: Endoscopy;  Laterality: N/A;   EYE SURGERY     KNEE ARTHROSCOPY  Left 10/19/2022   Procedure: LEFT KNEE ARTHROSCOPY WITH DEBRIDEMENT, PARTIAL MEDIAL AND LATERAL MENISCECTOMIES, AND REINJECTION OF HARVESTED INFRAPATELLAR FAT CELLS.;  Surgeon: Edie Norleen PARAS, MD;  Location: [facility]S;  Service: Orthopedics;  Laterality: Left;  MAKE 3RD CASE   LEFT HEART CATH AND CORONARY ANGIOGRAPHY N/A 01/28/2018   Procedure: LEFT HEART CATH AND CORONARY ANGIOGRAPHY;  Surgeon: Bosie Vinie LABOR, MD;  Location: [facility] INVASIVE CV LAB;  Service: Cardiovascular;  Laterality: N/A;   LEFT HEART CATH AND CORONARY ANGIOGRAPHY N/A 02/13/2019   Procedure: LEFT HEART CATH AND CORONARY ANGIOGRAPHY;  Surgeon: Dooms Marsa, MD;  Location: [facility] INV[facility] CV LAB;  Service: Cardiovascular;  Laterality: N/A;   LEFT HEART CATH AND CORONARY ANGIOGRAPHY Left 05/26/2008   Procedure: LEFT HEART CATH AND CORONARY ANGIOGRAPHY; Location: [facility]; Surgeon: Marsa Dooms, MD   LEFT HEART CATH AND CORONARY ANGIOGRAPHY Left 01/18/2011   Procedure: LEFT HEART CATH AND CORONARY ANGIOGRAPHY; Location: [facility]; Surgeon: Marsa Dooms, MD   OOPHORECTOMY N/A    Family History  Problem Relation Age of Onset   Hypertension Mother    Heart disease Father    CAD Father    Heart disease Sister    Stroke Sister    Breast cancer Neg Hx    Social History   Tobacco Use   Smoking status: Former    Types: Cigarettes    Passive exposure: Past   Smokeless tobacco: Never  Substance Use Topics   Alcohol use: No   LABS:    Component Ref Range & Units 09/04/2023  WBC (White Blood Cell Count) 4.1 - 10.2 10^3/uL 5.7  RBC (Red Blood Cell Count) 4.04 - 5.48 10^6/uL 4.36  Hemoglobin 12.0 - 15.0 gm/dL 87.2  Hematocrit 64.9 - 47.0 % 39.3  MCV (Mean Corpuscular Volume) 80.0 - 100.0 fl 90.1  MCH (Mean Corpuscular Hemoglobin) 27.0 - 31.2 pg 29.1  MCHC (Mean Corpuscular Hemoglobin Concentration) 32.0 - 36.0 gm/dL 67.6  Platelet Count 849 - 450 10^3/uL 317  RDW-CV (Red Cell Distribution Width) 11.6 -  14.8 % 13.5  MPV (Mean Platelet Volume) 9.4 - 12.4 fl 8.6 Low   Neutrophils 1.50 - 7.80 10^3/uL 2.70  Lymphocytes 1.00 - 3.60 10^3/uL 2.50  Mixed Count 0.10 - 0.90 10^3/uL 0.50  Neutrophil % 32.0 - 70.0 % 47.4  Lymphocyte % 10.0 - 50.0 % 44.4  Mixed % 3.0 - 14.4 % 8.2  Resulting Agency [facility]  Specimen Collected: 09/04/23 16:36   Performed by: [facility] Last Resulted: 09/04/23 16:37  Received From: [facility]  Result Received: 09/21/23 08:56   Component Ref Range & Units 09/05/2023  Glucose 70 - 110 mg/dL 87  Sodium 863 - 854 mmol/L 139  Potassium 3.6 - 5.1 mmol/L 4.4  Chloride 97 - 109 mmol/L 106  Carbon Dioxide (CO2) 22.0 - 32.0 mmol/L 30.7  Urea Nitrogen (BUN) 7 - 25 mg/dL 11  Creatinine 0.6 - 1.1 mg/dL 0.7  Glomerular Filtration Rate (eGFR) >60 mL/min/1.73sq m 96  Calcium  8.7 - 10.3 mg/dL 8.8  AST 8 - 39 U/L 18  ALT 5 - 38 U/L 11  Alk Phos (alkaline Phosphatase) 34 - 104 U/L 97  Albumin 3.5 - 4.8 g/dL 4.0  Bilirubin, Total 0.3 - 1.2 mg/dL 0.6  Protein, Total 6.1 - 7.9 g/dL 6.5  A/G Ratio 1.0 - 5.0 gm/dL 1.6  Resulting Agency [facility]  Specimen Collected: 09/05/23 09:36   Performed by: [facility] Last Resulted: 09/05/23 13:40  Received From: [facility]  Result Received: 09/21/23 08:56   Hospital Outpatient Visit on 09/25/2023  Component Date Value Ref Range Status   Color, Urine 09/25/2023 YELLOW (A)  YELLOW Final   APPearance 09/25/2023 CLEAR (A)  CLEAR Final   Specific Gravity, Urine 09/25/2023 1.024  1.005 - 1.030 Final   pH 09/25/2023 5.0  5.0 - 8.0 Final   Glucose, UA 09/25/2023 NEGATIVE  NEGATIVE mg/dL Final   Hgb urine dipstick 09/25/2023 NEGATIVE  NEGATIVE Final   Bilirubin Urine 09/25/2023 NEGATIVE  NEGATIVE Final   Ketones, ur 09/25/2023 NEGATIVE  NEGATIVE mg/dL Final   Protein, ur 92/84/7974 NEGATIVE  NEGATIVE mg/dL Final    Nitrite 92/84/7974 NEGATIVE  NEGATIVE Final   Leukocytes,Ua 09/25/2023 SMALL (A)  NEGATIVE Final   RBC / HPF 09/25/2023 0-5  0 - 5 RBC/hpf Final   WBC, UA 09/25/2023 11-20  0 - 5 WBC/hpf Final   Bacteria, UA 09/25/2023 NONE SEEN  NONE SEEN Final   Squamous Epithelial / HPF 09/25/2023 0-5  0 - 5 /HPF Final   Mucus 09/25/2023 PRESENT   Final   Performed at [facility], [address] [place], [place] [address]   MRSA, PCR 09/25/2023 NEGATIVE  NEGATIVE Final   Staphylococcus aureus 09/25/2023 NEGATIVE  NEGATIVE Final   Comment: (NOTE) The Xpert SA Assay (FDA approved for NASAL specimens in patients 21 years of age and older), is one component of a comprehensive surveillance program. It is not intended to diagnose infection nor to guide or monitor treatment. Performed at [facility], [address], [place], [place] [address]    Specimen Description 09/25/2023    Final                   Value:URINE, CLEAN CATCH Performed at [facility], [address] [place], [place] [address]    Special Requests 09/25/2023    Final                   Value:NONE Performed at [facility], [address] [place], [place] [address]    Culture 09/25/2023  (A)   Final                   Value:<10,000 COLONIES/mL INSIGNIFICANT GROWTH Performed at [facility], [address], [place], [place] [address]    Report Status 09/25/2023 09/27/2023 FINAL   Final    ECG: Date: 06/242025 Rate: 62 bpm Rhythm: normal sinus Axis (leads I and aVF):  normal Intervals: PR 0.14 ms. QRS 0.08 ms. QT 0.42 ms. ST segment and T wave changes: No evidence of acute T wave abnormalities or significant ST segment elevation or depression.  Evidence of a possible, age undetermined, prior infarct:  Yes; septal Comparison: Similar to previous tracing obtained on 10/12/2022   IMAGING / PROCEDURES: MR KNEE RIGHT WO CONTRAST performed on 07/15/2023 Degenerated and torn medial  meniscus as detailed above. Complete ACL tear (most likely chronic). The PCL and collateral ligaments are intact. Tricompartmental degenerative changes most significant in the medial compartment. Small joint effusion and small Baker's cyst. Lateral tilt and orientation of the patella in relation to the femoral trochlear groove and the medial retinaculum appears stretched but not torn. The TT-TG distance is 9 mm.  CT ANGIO HEAD NECK W WO CM performed on 03/01/2023 No acute intracranial abnormality. No acute vascular injury. Mild bilateral carotid bifurcation atherosclerosis without hemodynamically significant stenosis. Aortic Atherosclerosis No acute fracture or static subluxation of the cervical spine.   PULMONARY FUNCTION TESTING performed on 02/21/2022 SPIROMETRY: FVC was 2.11 liters, 78% of predicted FEV1 was 1.75, 81% of predicted FEV1 ratio was 82.75 FEF 25-75% liters per second was 89% of predicted LUNG VOLUMES: TLC was 94% of predicted RV was 136% of predicted DIFFUSION CAPACITY: DLCO was 88% of predicted DLCO/VA was 122% of predicted FLOW VOLUME LOOP: Normal IMPRESSION Spirometry overall is in normal range, flow volume on spirometry trending down - following  Lung volumes and diffusion capacity in normal range   TRANSTHORACIC ECHOCARDIOGRAM performed on 05/31/2018 Left ventricular ejection fraction, by visual estimation, is 40 to 45%. The left ventricle has mildly decreased function. Left ventricular septal wall thickness was normal. Normal left ventricular posterior wall thickness. There is no left ventricular hypertrophy.  Global right ventricle has normal systolic function.The right ventricular size is normal. No increase in right ventricular wall thickness.  Left atrial size was not well visualized.  Right atrial size was normal.  The pericardium was not well visualized.  The mitral valve is normal in structure. No evidence of mitral valve regurgitation. The  tricuspid valve is not well visualized. Tricuspid valve regurgitation is not demonstrated.  The aortic valve is normal in structure. Aortic valve regurgitation is not visualized.  The pulmonic valve was grossly normal. Pulmonic valve regurgitation is not visualized.  Mildly elevated pulmonary artery systolic pressure of 32.4 mmHg Apical hypokinesis.  The interatrial septum was not assessed.    LEFT HEART CATHETERIZATION AND CORONARY ANGIOGRAPHY performed on 05/31/2018 Mildly reduced left ventricular systolic function with an EF of 45% with apical dyskinesis Coronary artery disease 70% mid to distal LAD Previously placed stents Ostial to proximal RCA stent widely patent Proximal RCA stent widely patent Mid RCA stent widely patent Recommendations Medical therapy DAPT 2D echocardiogram Resume metoprolol  succinate and isosorbide  mononitrate Resume simvastatin        IMPRESSION AND PLAN: Courtney Blanchard has been referred for pre-anesthesia review and clearance prior to her undergoing the planned anesthetic and procedural courses. Available labs, pertinent testing, and imaging results were personally reviewed by me in preparation for upcoming operative/procedural course. [facility] medical record has been updated following extensive record review and patient interview with PAT staff.   ATTENTION --> PENDING CLEARANCE AT THIS TIME -- NOTE/CONTENTS NOT FINAL UNTIL SIGNED This patient has been appropriately cleared by cardiology with an overall *** risk of patient experiencing significant perioperative cardiovascular complications. Based on clinical review performed today (09/28/23), barring any significant acute changes in the patient's  overall condition, it is anticipated that she will be able to proceed with the planned surgical intervention. Any acute changes in clinical condition may necessitate her procedure being postponed and/or cancelled. Patient will meet with anesthesia team (MD  and/or CRNA) on the day of her procedure for preoperative evaluation/assessment. Questions regarding anesthetic course will be fielded at that time.   Pre-surgical instructions were reviewed with the patient during his PAT appointment, and questions were fielded to satisfaction by PAT clinical staff. She has been instructed on which medications that she will need to hold prior to surgery, as well as the ones that have been deemed safe/appropriate to take on the day of her procedure. As part of the general education provided by PAT, patient made aware both verbally and in writing, that she would need to abstain from the use of any illegal substances during her perioperative course. She was advised that failure to follow the provided instructions could necessitate case cancellation or result in serious perioperative complications up to and including death. Patient encouraged to contact PAT and/or her surgeon's office to discuss any questions or concerns that may arise prior to surgery; verbalized understanding.   Dorise Pereyra, MSN, APRN, FNP-C, CEN [facility]  Perioperative Services Nurse Practitioner Phone: [phone] Fax: [phone] 09/28/23 1:58 PM  NOTE: This note has been prepared using Dragon dictation software. Despite my best ability to proofread, there is always the potential that unintentional transcriptional errors may still occur from this process.

## 2023-10-03 DIAGNOSIS — Z955 Presence of coronary angioplasty implant and graft: Secondary | ICD-10-CM | POA: Diagnosis not present

## 2023-10-03 DIAGNOSIS — Z950 Presence of cardiac pacemaker: Secondary | ICD-10-CM | POA: Diagnosis not present

## 2023-10-03 DIAGNOSIS — I25118 Atherosclerotic heart disease of native coronary artery with other forms of angina pectoris: Secondary | ICD-10-CM | POA: Diagnosis not present

## 2023-10-03 DIAGNOSIS — I2102 ST elevation (STEMI) myocardial infarction involving left anterior descending coronary artery: Secondary | ICD-10-CM | POA: Diagnosis not present

## 2023-10-03 DIAGNOSIS — R0602 Shortness of breath: Secondary | ICD-10-CM | POA: Diagnosis not present

## 2023-10-04 ENCOUNTER — Encounter: Admission: RE | Payer: Self-pay | Source: Home / Self Care

## 2023-10-04 ENCOUNTER — Ambulatory Visit: Admission: RE | Admit: 2023-10-04 | Source: Home / Self Care | Admitting: Surgery

## 2023-10-04 HISTORY — DX: Other forms of dyspnea: R06.09

## 2023-10-04 HISTORY — DX: Bilateral primary osteoarthritis of knee: M17.0

## 2023-10-04 HISTORY — DX: Angina pectoris, unspecified: I20.9

## 2023-10-04 HISTORY — DX: Other meniscus derangements, unspecified meniscus, right knee: M23.306

## 2023-10-04 HISTORY — DX: Other specified health status: Z78.9

## 2023-10-04 SURGERY — ARTHROPLASTY, KNEE, TOTAL
Anesthesia: Choice | Site: Knee | Laterality: Right

## 2023-10-10 DIAGNOSIS — I2102 ST elevation (STEMI) myocardial infarction involving left anterior descending coronary artery: Secondary | ICD-10-CM | POA: Diagnosis not present

## 2023-10-10 DIAGNOSIS — I83811 Varicose veins of right lower extremities with pain: Secondary | ICD-10-CM | POA: Diagnosis not present

## 2023-10-10 DIAGNOSIS — I25118 Atherosclerotic heart disease of native coronary artery with other forms of angina pectoris: Secondary | ICD-10-CM | POA: Diagnosis not present

## 2023-10-10 DIAGNOSIS — R002 Palpitations: Secondary | ICD-10-CM | POA: Diagnosis not present

## 2023-10-10 DIAGNOSIS — I10 Essential (primary) hypertension: Secondary | ICD-10-CM | POA: Diagnosis not present

## 2023-10-10 DIAGNOSIS — I83813 Varicose veins of bilateral lower extremities with pain: Secondary | ICD-10-CM | POA: Diagnosis not present

## 2023-10-10 DIAGNOSIS — R0602 Shortness of breath: Secondary | ICD-10-CM | POA: Diagnosis not present

## 2023-10-10 DIAGNOSIS — Z955 Presence of coronary angioplasty implant and graft: Secondary | ICD-10-CM | POA: Diagnosis not present

## 2023-10-10 DIAGNOSIS — E785 Hyperlipidemia, unspecified: Secondary | ICD-10-CM | POA: Diagnosis not present

## 2023-10-12 DIAGNOSIS — E538 Deficiency of other specified B group vitamins: Secondary | ICD-10-CM | POA: Diagnosis not present

## 2023-10-19 DIAGNOSIS — M1711 Unilateral primary osteoarthritis, right knee: Secondary | ICD-10-CM | POA: Diagnosis not present

## 2023-10-20 ENCOUNTER — Encounter: Payer: Self-pay | Admitting: Urgent Care

## 2023-10-24 ENCOUNTER — Inpatient Hospital Stay: Admission: RE | Admit: 2023-10-24 | Source: Ambulatory Visit

## 2023-10-25 ENCOUNTER — Encounter: Admission: RE | Payer: Self-pay | Source: Home / Self Care

## 2023-10-25 ENCOUNTER — Ambulatory Visit: Admission: RE | Admit: 2023-10-25 | Source: Home / Self Care | Admitting: Surgery

## 2023-10-25 SURGERY — ARTHROPLASTY, KNEE, TOTAL
Anesthesia: Choice | Site: Knee | Laterality: Right

## 2023-10-29 ENCOUNTER — Ambulatory Visit
Admission: RE | Admit: 2023-10-29 | Discharge: 2023-10-29 | Disposition: A | Discharge status: Home / Self Care | Source: Ambulatory Visit | Attending: Specialist | Admitting: Specialist

## 2023-10-29 ENCOUNTER — Other Ambulatory Visit
Admission: RE | Admit: 2023-10-29 | Discharge: 2023-10-29 | Disposition: A | Discharge status: Home / Self Care | Source: Ambulatory Visit | Attending: Specialist | Admitting: Specialist

## 2023-10-29 ENCOUNTER — Other Ambulatory Visit: Payer: Self-pay | Admitting: Specialist

## 2023-10-29 DIAGNOSIS — I700 Atherosclerosis of aorta: Secondary | ICD-10-CM | POA: Diagnosis not present

## 2023-10-29 DIAGNOSIS — J9811 Atelectasis: Secondary | ICD-10-CM | POA: Diagnosis not present

## 2023-10-29 DIAGNOSIS — R0602 Shortness of breath: Secondary | ICD-10-CM | POA: Diagnosis not present

## 2023-10-29 DIAGNOSIS — R7989 Other specified abnormal findings of blood chemistry: Secondary | ICD-10-CM | POA: Insufficient documentation

## 2023-10-29 DIAGNOSIS — R091 Pleurisy: Secondary | ICD-10-CM | POA: Diagnosis not present

## 2023-10-29 DIAGNOSIS — J4520 Mild intermittent asthma, uncomplicated: Secondary | ICD-10-CM | POA: Diagnosis not present

## 2023-10-29 DIAGNOSIS — R911 Solitary pulmonary nodule: Secondary | ICD-10-CM | POA: Diagnosis not present

## 2023-10-29 LAB — D-DIMER, QUANTITATIVE: D-Dimer, Quant: 0.7 ug{FEU}/mL — ABNORMAL HIGH (ref 0.00–0.50)

## 2023-10-29 MED ORDER — IOHEXOL 350 MG/ML SOLN
75.0000 mL | Freq: Once | INTRAVENOUS | Status: AC | PRN
  Administered 2023-10-29: 75 mL via INTRAVENOUS

## 2023-11-02 DIAGNOSIS — R0600 Dyspnea, unspecified: Secondary | ICD-10-CM | POA: Diagnosis not present

## 2023-11-02 DIAGNOSIS — R490 Dysphonia: Secondary | ICD-10-CM | POA: Diagnosis not present

## 2023-11-02 DIAGNOSIS — R911 Solitary pulmonary nodule: Secondary | ICD-10-CM | POA: Diagnosis not present

## 2023-11-16 DIAGNOSIS — E538 Deficiency of other specified B group vitamins: Secondary | ICD-10-CM | POA: Diagnosis not present
